# Patient Record
Sex: Male | Born: 1935 | Race: White | Hispanic: No | Marital: Married | Smoking: Former smoker
Health system: Southern US, Community
[De-identification: ages and names within clinical notes are randomized; demographics above are authoritative.]

## PROBLEM LIST (undated history)

## (undated) DIAGNOSIS — I639 Cerebral infarction, unspecified: Secondary | ICD-10-CM

## (undated) DIAGNOSIS — D649 Anemia, unspecified: Secondary | ICD-10-CM

## (undated) DIAGNOSIS — E785 Hyperlipidemia, unspecified: Secondary | ICD-10-CM

## (undated) DIAGNOSIS — B54 Unspecified malaria: Secondary | ICD-10-CM

## (undated) DIAGNOSIS — C689 Malignant neoplasm of urinary organ, unspecified: Secondary | ICD-10-CM

## (undated) DIAGNOSIS — I10 Essential (primary) hypertension: Secondary | ICD-10-CM

## (undated) DIAGNOSIS — M1990 Unspecified osteoarthritis, unspecified site: Secondary | ICD-10-CM

## (undated) HISTORY — PX: CATARACT EXTRACTION: SUR2

## (undated) HISTORY — PX: EYE SURGERY: SHX253

## (undated) HISTORY — PX: HERNIA REPAIR: SHX51

## (undated) HISTORY — PX: APPENDECTOMY: SHX54

---

## 2000-05-08 ENCOUNTER — Encounter: Payer: Self-pay | Admitting: Internal Medicine

## 2000-05-08 ENCOUNTER — Ambulatory Visit (HOSPITAL_COMMUNITY): Admission: RE | Admit: 2000-05-08 | Discharge: 2000-05-08 | Payer: Self-pay | Admitting: Internal Medicine

## 2001-07-21 ENCOUNTER — Other Ambulatory Visit: Admission: RE | Admit: 2001-07-21 | Discharge: 2001-07-21 | Payer: Self-pay | Admitting: Dermatology

## 2001-08-24 ENCOUNTER — Encounter: Payer: Self-pay | Admitting: *Deleted

## 2001-08-24 ENCOUNTER — Emergency Department (HOSPITAL_COMMUNITY): Admission: EM | Admit: 2001-08-24 | Discharge: 2001-08-24 | Payer: Self-pay | Admitting: *Deleted

## 2001-08-25 ENCOUNTER — Encounter: Payer: Self-pay | Admitting: *Deleted

## 2001-08-25 ENCOUNTER — Ambulatory Visit (HOSPITAL_COMMUNITY): Admission: RE | Admit: 2001-08-25 | Discharge: 2001-08-25 | Payer: Self-pay | Admitting: *Deleted

## 2004-12-10 ENCOUNTER — Emergency Department (HOSPITAL_COMMUNITY): Admission: EM | Admit: 2004-12-10 | Discharge: 2004-12-10 | Payer: Self-pay | Admitting: *Deleted

## 2004-12-11 ENCOUNTER — Ambulatory Visit: Payer: Self-pay | Admitting: Internal Medicine

## 2004-12-11 ENCOUNTER — Inpatient Hospital Stay (HOSPITAL_COMMUNITY): Admission: EM | Admit: 2004-12-11 | Discharge: 2004-12-19 | Payer: Self-pay | Admitting: *Deleted

## 2004-12-18 ENCOUNTER — Ambulatory Visit: Payer: Self-pay | Admitting: Internal Medicine

## 2006-03-02 ENCOUNTER — Emergency Department (HOSPITAL_COMMUNITY): Admission: EM | Admit: 2006-03-02 | Discharge: 2006-03-02 | Payer: Self-pay | Admitting: Emergency Medicine

## 2008-01-29 ENCOUNTER — Ambulatory Visit (HOSPITAL_COMMUNITY): Admission: RE | Admit: 2008-01-29 | Discharge: 2008-01-29 | Payer: Self-pay | Admitting: Nephrology

## 2008-05-13 ENCOUNTER — Inpatient Hospital Stay (HOSPITAL_COMMUNITY): Admission: EM | Admit: 2008-05-13 | Discharge: 2008-05-16 | Payer: Self-pay | Admitting: Emergency Medicine

## 2008-05-13 ENCOUNTER — Ambulatory Visit: Payer: Self-pay | Admitting: Family Medicine

## 2008-05-31 ENCOUNTER — Ambulatory Visit (HOSPITAL_COMMUNITY): Admission: RE | Admit: 2008-05-31 | Discharge: 2008-05-31 | Payer: Self-pay | Admitting: Ophthalmology

## 2010-06-22 ENCOUNTER — Emergency Department (HOSPITAL_COMMUNITY): Admission: EM | Admit: 2010-06-22 | Discharge: 2010-06-22 | Payer: Self-pay | Admitting: Emergency Medicine

## 2011-04-10 NOTE — Discharge Summary (Signed)
NAME:  Reginald Waller, Reginald Waller NO.:  [id number]   MEDICAL RECORD NO.:  [id number]          PATIENT TYPE:  INP   LOCATION:  3741                         FACILITY:  [facility]   PHYSICIAN:  Leighton Roach McDiarmid, M.D.DATE OF BIRTH:  [birth year]   DATE OF ADMISSION:  05/13/2008  DATE OF DISCHARGE:  05/16/2008                               DISCHARGE SUMMARY   PRIMARY CARE PHYSICIAN:  Kirk Ruths, MD   CARDIOLOGIST:  Ritta Slot, MD   GASTROENTEROLOGIST:  Lionel December, MD   DISCHARGE DIAGNOSES:  1. Anemia secondary to iron deficiency and believe gastrointestinal      loss.  2. Shortness of breath secondary to anemia.  3. Hypertension.  4. Peripheral vascular disease.  5. Congestive heart failure with mild diastolic dysfunction.  6. Questionable chronic renal insufficiency.   DISCHARGE MEDICATIONS:  1. Hydrochlorothiazide 25 mg one p.o. daily.  2. Zocor 40 mg one p.o. daily.  3. Lotrel 10/40 mg one p.o. daily.  4. Ferrous sulfate 325 mg one p.o. daily.  5. Please restart aspirin in 2 weeks.  6. Zantac p.r.n.  7. Percocet p.r.n.  8. Skelaxin daily.   MEDICATIONS DISCONTINUED DURING THIS HOSPITALIZATION:  1. Aspirin  2. Plavix  3. Pletal to be restarted per PCP and cardiologist.   INITIAL LABS:  White blood cell 8.1, hemoglobin 6.4, hematocrit 18.4,  previous hemoglobin in 2006 was 12 and then hemoglobin in May 2009 was  9, and platelets 500,000.  Cardiac enzymes negative x3.   D-dimer elevated at 1.17.  Fecal occult blood positive.  Sodium 141,  potassium 4.5, chloride 110, glucose 96, BUN 37, and creatinine 1.9.   DISCHARGE LABS:  Anemia panel showing 1.6% reticulocytes, iron 12, TIBC  395, and percent saturation 3.  Vitamin B12 low at 324, low normal at  324 folate greater than 20, and ferritin 9.  Hemoglobin 10.2, hematocrit  30.6, white blood cells 8.5, and platelets 414,000.  Sodium 138,  potassium 5.1 but hemolyzed, chloride 108, bicarb 24, glucose 92,  BUN  28, creatinine 1.59, and calcium 8.9.   PROCEDURES AND IMAGING:  1. Portable chest x-ray showing emphysema, but no acute      cardiopulmonary process.  2. Status post transfusion of 3 units packed red blood cells during      this hospitalization.  3. Esophagogastroduodenoscopy during this hospitalization showing      normal anatomy and nothing to explain anemia or heme-positive      stool.   HOSPITAL COURSE:  For full summary, please see dictated H&P.  In short,  this is an 77 year old gentleman with history of hypertension,  hyperlipidemia, and GERD who presents with shortness of breath x3 weeks  and he was found to have a hemoglobin of 6.4.  1. Anemia.  This was thought to be a subacute-to-chronic process given      previous hemoglobin in 2006 was normal at 12 and then repeat one in      May of this year was low at 9 and on admission was found to be 6.4.      Hemoccult was positive so  it was believed that the patient's blood      loss stemmed from the GI tract.  Gastroenterology was consulted.      Of note, the patient had a normal colonoscopy 3 months ago in [place]      per Dr. Karilyn Cota.  Gastroenterology here decided to perform an      endoscopy which revealed normal anatomy.  The patient was      transfused 3 units of packed red blood cells and hemoglobin was      stable prior to discharge at 10.  GI recommends having the patient      follow up for possible CT enteropathy or consideration of capsule      endoscopy.  The patient states he will follow up with Dr. Karilyn Cota up      in [place].  Iron was started prior to discharge.  2. Dyspnea likely secondary to problem number one of anemia.      Initially, D-dimer was elevated; however, Wells' criteria was low      probability.  It was decided to monitor the patient and re-evaluate      upon transfusion.  After 3 units, the patient's dyspnea had      resolved and the patient did not present did not have      lightheadedness upon  standing.  3. Hypertension.  The patient's blood pressures were treated with      hydrochlorothiazide and Lotrel as per home regimen and the patient      tolerated this well.  4. Congestive heart failure.  Per report, the patient had diastolic      dysfunction.  The patient's cardiologist is Dr. Lynnea Ferrier at      [facility].  Per records, an echo in April of      this year showed mild diastolic dysfunction with normal left      ventricular systolic function.  5. Peripheral vascular disease.  Per records, the patient had multiple      falls stenosis of multiple blood vessels.  The patient was on      Pletal, Plavix, and aspirin for that this.  All three were held      prior to endoscopy, and upon discharge the patient was only to      restart aspirin 2 weeks after endoscopy.  The rest of antiplatelet      agents to be restarted at discretion of PCP and cardiologist.  6. Hyperlipidemia.  Statin was continued.   FOLLOW UP:  The patient will follow up Dr. Karilyn Cota of gastroenterology in  [place] and to call for appointment in the next 1-2 weeks.      Eustaquio Boyden, MD  Electronically Signed      Leighton Roach McDiarmid, M.D.  Electronically Signed    JG/MEDQ  D:  05/17/2008  T:  05/18/2008  Job:  [id number]   cc:   Kirk Ruths, M.D.  Ritta Slot, MD  Lionel December, M.D.

## 2011-04-10 NOTE — Consult Note (Signed)
NAME:  Reginald Waller, Reginald Waller NO.:  [id number]   MEDICAL RECORD NO.:  [id number]          PATIENT TYPE:  INP   LOCATION:  3741                         FACILITY:  [facility]   PHYSICIAN:  Bernette Redbird, M.D.   DATE OF BIRTH:  05/03/1919   DATE OF CONSULTATION:  05/13/2008  DATE OF DISCHARGE:                                 CONSULTATION   The family practice teaching service (Dr. Tawanna Cooler McDiarmid, attending)  asked [place] to see this 77 year old gentleman because of severe anemia and  heme-positive stool.   Reginald Waller has no prior significant GI history, specifically no history  of GI bleeding or ulcers.  He reportedly had a negative colonoscopy by  Dr. Karilyn Cota in [place] several months ago.   He was admitted through the emergency room today because of significant  exertional dyspnea and was found to have a low hemoglobin level of 7.1.  He was therefore admitted.  He was noted to have Hemoccult-positive  stool, but there is no mention of melena.  No history of rectal  bleeding.   Of note, the patient takes a daily 81 mg aspirin along with Plavix and  Pletal, and he has not been on PPI therapy.   Of note, the anemia is normocytic with an MCV of 87 and an RDW that is  normal at 14.8.  His hemoglobin several years ago was approximately  12.7.  Reportedly, he had a hemoglobin of 9.5 last month, although I do  not see a copy of that on e-chart.   PAST MEDICAL HISTORY:  No known medication allergies.   OUTPATIENT MEDICATIONS:  Vicodin, Skelaxin, hydrochlorothiazide, Plavix,  simvastatin, Cozaar and 81 mg aspirin.   OPERATIONS:  Appendectomy.   CHRONIC MEDICAL ILLNESSES:  Hypertension, peripheral vascular disease  with apparently claudication, no known coronary artery disease or  apparently any COPD.   HABITS:  The patient smoked for about 60 years.  He used to drink  socially.   FAMILY HISTORY:  Liver cancer in a sibling, negative for colon cancer or  ulcers.   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   REVIEW OF SYSTEMS:  Basically, negative other than the exertional  dyspnea noted.  Specifically, GI review of systems is pertinent only for  constipation, which seems to be related to medications.  He has to use a  laxative (milk of magnesia) to maintain defecation.  No problem with  anorexia, involuntary weight loss, dysphagia, reflux symptoms, abdominal  pain of any significance, diarrhea, or rectal bleeding.  He does note  some gas since his appendectomy several years ago.   PHYSICAL EXAMINATION:  GENERAL:  A pale, but pleasant, fairly articulate  gentleman, in no acute distress.  HEENT:  He is anicteric.  CHEST:  Clear to auscultation.  HEART:  Soft apical systolic murmur.  ABDOMEN:  Without organomegaly, guarding, mass, or tenderness.  Stool is  heme-positive when checked earlier today, and was not repeated.  NEUROLOGICAL:  The patient is  coherent and appropriate without obvious  cranial nerve or upper extremity motor deficits.   LABORATORY DATA:  Hemoglobin 7.1, MCV 79, RDW normal at 14.8, and  platelet count 500,000.  D-dimer elevated at 1.17, BUN 37 with  creatinine of 1.9 (baseline values not currently known).  Occult blood  positive.   IMPRESSION:  1. Severe progressive anemia, normocytic.  2. Heme-positive stool without overt gastrointestinal bleeding.  3. History of aspirin exposure prior to admission, without proton pump      inhibitor coverage.  4. Constipation, apparently medication related.   DISCUSSION:  With a reportedly negative colonoscopy within the past  several months, and with a history of aspirin exposure, I would  certainly wonder about aspirin-induced gastropathy as a source of the  subacute or low-grade GI bleed causing the anemia.  Gastric cancer seems  unlikely given the absence of upper tract  symptoms.   RECOMMENDATIONS:  1. Endoscopic evaluation in the morning, the nature, purpose, and      risks of which have been reviewed with the patient.  If that is      negative, we may need to verify records from the recent colonoscopy      and possibly consider small bowel evaluation.  2. I would consider MiraLax for outpatient management of the patient's      constipation.   We appreciate the opportunity to have seen this patient in consultation  with you.           ______________________________  Bernette Redbird, M.D.     RB/MEDQ  D:  05/13/2008  T:  05/14/2008  Job:  [id number]   cc:   Kirk Ruths, M.D.

## 2011-04-10 NOTE — Op Note (Signed)
NAME:  Reginald Waller, Reginald Waller NO.:  [id number]   MEDICAL RECORD NO.:  [id number]          PATIENT TYPE:  INP   LOCATION:  3741                         FACILITY:  [facility]   PHYSICIAN:  Graylin Shiver, M.D.   DATE OF BIRTH:  03/11/1919   DATE OF PROCEDURE:  05/14/2008  DATE OF DISCHARGE:                               OPERATIVE REPORT   INDICATION FOR PROCEDURE:  Anemia.   The patient reports a normal colonoscopy several months ago by Dr.  Karilyn Cota.  We do not have that report.   The anemia appears to be iron deficient, percent saturation is 12, stool  for occult blood was positive, and hemoglobin and hematocrit on  admission 6.4 and 18.4 respectively.   The patient was admitted because of the exertional dyspnea.  There was  no gross GI bleeding.   Informed consent was obtained after explanation of the risks of  bleeding, infection, and perforation.   PREMEDICATIONS:  1. Fentanyl 50 mcg IV.  2. Versed 5 mg IV.   PROCEDURE:  With the patient in the left lateral decubitus position, the  Pentax gastroscope was inserted into the oropharynx and passed into the  esophagus.  It was advanced down the esophagus, then into the stomach,  and into the duodenum.  The second portion and bulb of the duodenum were  normal.  The stomach looked normal in its entirety.  The esophagus  looked normal in its entirety.  He tolerated the procedure well without  complications.   IMPRESSION:  Normal esophagogastroduodenoscopy.   There is nothing on this examination to explain anemia or heme-positive  stool.   It would be helpful to get the colonoscopy report from Dr. Karilyn Cota for  our documentation purposes.  I think once the H&H are stable, he could  probably followup as an outpatient with either [place] or with Dr. Karilyn Cota in  [place], whoever the patient chooses to followup with.  We would probably  next consider evaluation of small bowel, either with CT enteropathy or  consideration of capsule  endoscopy.           ______________________________  Graylin Shiver, M.D.    SFG/MEDQ  D:  05/14/2008  T:  05/15/2008  Job:  [id number]   cc:   Leighton Roach McDiarmid, M.D.  Bernette Redbird, M.D.  Lionel December, M.D.

## 2011-04-10 NOTE — H&P (Signed)
NAMESTEPHANOS, FAN NO.:  [id number]   MEDICAL RECORD NO.:  [id number]          PATIENT TYPE:  INP   LOCATION:  3741                         FACILITY:  [facility]   PHYSICIAN:  Leighton Roach McDiarmid, M.D.DATE OF BIRTH:  13-Jul-1919   DATE OF ADMISSION:  05/13/2008  DATE OF DISCHARGE:                              HISTORY & PHYSICAL   CHIEF COMPLAINT:  Shortness of breath.   PRIMARY CARE PHYSICIAN:  Kirk Ruths, M.D.   CARDIOLOGIST:  Ritta Slot, MD   OPHTHALMOLOGISTAlford Highland. Rankin, MD   HISTORY OF PRESENT ILLNESS:  This is an 77 year old gentleman with  history of hypertension and GERD who presents with a 3-week history of  dyspnea on exertion, which the patient reports has progressively  worsened over the last week.  He state decreased ability to do yardwork  and housework.  He has to sit down and catch his breath and at times,  takes up to 10 minutes to catch his breath.  The patient reports  occasional chest pain and left arm pain with dyspnea characterized as  dull-type pain like he is going to pass out.  No loss of consciousness  though.  The patient has been dizzy and lightheaded for a week when he  stands up or goes to move around.  The patient denies cough or fever.  The patient was today at the ophthalmologist to undergo a procedure,  noted to be short of breath and sent to the ER for evaluation.  In the  ER, the patient was found to have a hemoglobin of 6.4.  The patient  states that 1 month ago he was told he had some significant peripheral  vascular disease in his neck, legs, and inguinal region.  Also, the  patient reports that he had a normal colonoscopy 3 months ago at  [facility] in [place], [place].  The patient denies melanotic  stool or diarrhea or any frank blood in his stool or urine.  The patient  denies vomiting.   ALLERGIES:  SHELLFISH, PLAIN ASPIRIN, although coated aspirins are okay.   PAST MEDICAL HISTORY:  The  patient reports he has had high blood  pressure in the past, as well as hyperlipidemia, arthritis, glaucoma,  and suffers from constipation.  Questionable chronic renal  insufficiency.  Retinal occlusion in the past with bilateral cataract  surgery.  Gastroesophageal reflux disease and chronic back pain as well  as severe peripheral vascular disease.   PAST SURGICAL HISTORY:  Perforated appendicitis, status post  appendectomy in 2006, also bilateral inguinal hernias remotely and  bilateral cataract surgery.   MEDICATIONS:  1. Coated aspirin 81 mg daily.  2. Tylenol p.r.n.  3. Hydrocodone/APAP p.r.n.  4. Skelaxin daily.  5. Hydrochlorothiazide 25 mg 1 p.o. daily.  6. Plavix 75 mg 1 p.o. daily.  7. Simvastatin 40 mg 1 p.o. daily.  8. Lotrel 10/40 mg 1 p.o. daily.  9. Pletal.   SOCIAL HISTORY:  The patient lives with his wife, has no children.  They  have been married for 62 years.  The patient is a retired  tobacco  machinist.  He states he has greater than 6-pack-year history but quite  5 years ago.  Denies alcohol and recreational drugs.   FAMILY HISTORY:  Significant for lung cancer, liver cancer, breast  cancer, and leukemia and brother who passed away from an MI at 70 years  old.   REVIEW OF SYSTEMS:  Positive for nocturia and constipation and recent  deterioration of vision.  The patient states that he thought he had  passed some blood in his stool 3 months ago, but he took stool for  evaluation to his PCP and it was found to be negative for blood.  Rest  of review of systems as per HPI, otherwise negative.   PHYSICAL EXAM:  VITAL SIGNS:  Temperature 98.1, pulse 70, although when  he sits up or stands he is tachycardiac to 100, respiratory rate is 14,  although when he stands up, it increases to 30.  Blood pressure 142/49.  Pulse ox 100% on room air.  GENERAL:  No acute distress, lying supine.  HEENT:  Normocephalic, atraumatic.  Pupils equally round and reactive to   light.  Extraocular movements intact.  Moist mucous membrane.  No  pharyngeal erythema or edema.  NECK:  No lymphadenopathy.  Positive bilateral bruits.  CARDIOVASCULAR:  Normal S1 and S2, 2/6 early systolic ejection murmur  best heard at the left upper sternal border.  LUNGS:  Crackles throughout, good air movement.  ABDOMEN:  Soft, nontender, nondistended, no hepatosplenomegaly, no mass,  and increased bowel sounds.  EXTREMITIES:  No clubbing, cyanosis, or edema.  NEUROLOGIC:  Cranial nerves II through XII grossly intact.  Motor  strength grossly intact.  Gait somewhat wobbly but the patient denies  lightheadedness when he stands up and walks.  MUSCULOSKELETAL:  Full range of motion.   LABS:  Fecal occult blood positive.  D-dimer 1.17.  Elevated iStat with  sodium 141, potassium 4.5, chloride 110, bicarb 21, BUN 37, creatinine  1.9, and glucose 96.  Point of care enzymes negative.  Troponin less  than 0.05.  White blood cell 8.1, hemoglobin 6.4, MCV 89, hematocrit  18.4, and platelets 500.  Chest x-ray showing emphysema with chronic interstitial coarsening, no  acute cardiopulmonary process.   ASSESSMENT AND PLAN:  This is an 77 year old male with a history of  hypertension, hyperlipidemia, bilateral cataract surgery, and  gastroesophageal reflux disease who presents with a 3-week history of  shortness of breath and hemoglobin of 6.4.  1. Shortness of breath likely secondary to anemia.  We will start IV      fluids and transfuse, monitor for improvement.  He has increased D-      dimer, although no hypoxia, hypotension, or tachycardia at      baseline, so less likely PE.  Also, symptoms have been gradual and      progressively worsening over the past several weeks.  We will      continue to monitor.  If no improvement after transfusion, low      threshold for helical CT to evaluate for PE.  2. Anemia.  In 2006, the patient had hemoglobin of 12.  In May 2009,      the patient had  hemoglobin of 9.  Now hemoglobin is 6.4 likely      chronic or subacute bleed, not acute blood loss.  Monitor with CBC      q. 6 hours.  Transfused 3 units, first one in the ED.  We will  monitor hemoglobin for improvement.  Per report normal colonoscopy      in February 2009, we will obtain records.  We will consult      gastroenterology for evaluation and possible endoscopy not acutely.      NPO for now while gastroenterology comes by.  This is a normocytic      anemia.  We will obtain an anemia panel to further evaluate.  Hold      Plavix, aspirin, and Pletal and no NSAIDs for now.  Start with high      dose PPI.  3. Chest pain.  The patient reports some chest pain with exertion when      he gets short of breath although currently none.  Regardless, we      will cycle cardiac enzymes and repeat EKG in the morning to rule      out ACS.  4. Hypertension.  Continue hydrochlorothiazide and Lotrel, withhold      parameters of holding for systolics less than 120.  5. Eye disease.  Monitored, currently stable.  6. Peripheral vascular disease.  Per records, the patient with carotid      Dopplers with 50-69% stenosis bilaterally.  Also, with right      profunda femoral artery 70-99% stenosis, right superficial femoral      artery 70-99% stenosis, right popliteal artery 70-99% stenosis,      left external iliac artery greater than 60% stenosis, left      superficial femoral artery occluded as well as left APA occluded,      bilateral ABIs with moderate arterial occlusion at rest.  We will      hold Pletal, Plavix, and aspirin for now, otherwise stable.  7. Congestive heart failure.  Echo 2D from April 2009 with mild      diastolic dysfunction but normal left ventricular systolic      function.  8. Fluids, electrolytes, nutrition.  Hold NPO until evaluation by GI.      Maintenance of IV fluids with D5 half-normal.  9. Prophylaxis, high dose PPI and SEDs.  10.Disposition, pending upper GI  and GI consult.      Eustaquio Boyden, MD  Electronically Signed      Leighton Roach McDiarmid, M.D.  Electronically Signed    JG/MEDQ  D:  05/13/2008  T:  05/14/2008  Job:  [id number]

## 2011-04-10 NOTE — Op Note (Signed)
NAMEJARROD, Reginald Waller               ACCOUNT NO.:  [id number]   MEDICAL RECORD NO.:  [id number]          PATIENT TYPE:  AMB   LOCATION:  SDS                          FACILITY:  [facility]   PHYSICIAN:  Alford Highland. Rankin, M.D.   DATE OF BIRTH:  05-15-1919   DATE OF PROCEDURE:  05/31/2008  DATE OF DISCHARGE:  05/31/2008                               OPERATIVE REPORT   PREOPERATIVE DIAGNOSES:  1. Dense vitreous hemorrhage left eye.  2. 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  3. 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   POSTOPERATIVE DIAGNOSES:  1. Dense vitreous hemorrhage left eye.  2. 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  3. 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   PROCEDURE:  Posterior vitrectomy - endolaser panphotocoagulation left  eye - 25 gauge.   SURGEON:  Fawn Kirk, MD   ANESTHESIA:  Local retrobulbar anesthesia control.   FINDINGS:  Mild optic atrophy underlying.   INDICATIONS FOR PROCEDURE:  The patient is an 77 year old man who has  profound vision loss on the basis of dense vitreous hemorrhage of left  eye non-clearing.  The patient understands this is an attempt to remove  the vitreous opacification to regain his best visual function.  He  understands the risks of anesthesia including the recurrence of death.  He also understands the risk of the eye from the underlying condition,  as well as surgical repair including but not limited to hemorrhage,  infection, scarring, need for surgery, no change in vision, loss of  vision, and progression of disease despite intervention.  Appropriate  signed consent was obtained.  The patient was taken to the operating  room.   In the operating room, appropriate monitors followed by mild sedation.  Xylocaine 2% 5 mL injected 5 mL retrobulbar with additional 5 mL  laterally in the fascia modified Darel Hong.  The left periocular region  was sterilely prepped and draped in the  usual sterile fashion.  Lid  speculum applied.  A 25-gauge trocar placed inferotemporally.  The  infusion turned on.  A superior trocar was applied.  Core vitrectomy was  then begun.  Vitreous was cleared.   It appeared to be a small branch artery occlusion inferotemporally.  This area was treated and also some mild scattered laser was placed in  mid periphery.  No retinal holes or tears were identified.  Fluid  exchange was then completed to hasten the clearance of the vitreous  hemorrhage.  Thereafter an air-fluid exchange was completed and  the fluid was allowed to remain in the eye for postoperative visual  clarity.  At this time, the infusion removed.  Subconjunctival Decadron  applied.  Sterile patch Fox shield applied. .  The patient tolerated the  procedure without complication.  The patient was taken to the PACU area  to be followed and discharged later as an outpatient.      Alford Highland Rankin, M.D.  Electronically Signed     GAR/MEDQ  D:  05/31/2008  T:  06/01/2008  Job:  [id number]

## 2011-04-13 NOTE — Discharge Summary (Signed)
NAME:  Reginald Waller, Reginald Waller NO.:  [id number]   MEDICAL RECORD NO.:  [id number]          PATIENT TYPE:  INP   LOCATION:  A338                          FACILITY:  [facility]   PHYSICIAN:  Dalia Heading, M.D.  DATE OF BIRTH:  [birth year]   DATE OF ADMISSION:  12/11/2004  DATE OF DISCHARGE:  01/24/2006LH                                 DISCHARGE SUMMARY   HOSPITAL COURSE:  Patient is an 77 year old white male who presented to  [facility] with nonspecific abdominal pain.  An ultrasound of the  gallbladder revealed cholelithiasis.  A CT scan of the abdomen and pelvis  was performed which was originally read as cholecystitis and cholelithiasis,  but then was reread as perforated appendicitis.  Patient went to the  operating room on December 11, 2004 and underwent a laparoscopic  appendectomy.  He was noted to have a perforated appendix.  His abdominal  cavity was irrigated copiously.  A drain was placed in the right lower  quadrant.  Patient's postoperative course was remarkable for a postoperative  ileus which subsequently resolved after nasogastric tube decompression.  His  diet was advanced without difficulty once this was done.   The patient is being discharged home on December 19, 2004 in good improving  condition.   DISCHARGE INSTRUCTIONS:  The patient is to follow up with Dr. Franky Macho  on December 21, 2004.   DISCHARGE MEDICATIONS:  He is to resume all the medications as previously  prescribed.   PRINCIPAL DIAGNOSIS:  1.  Perforated appendicitis with peritonitis.  2.  Cholelithiasis.   PRINCIPAL PROCEDURE:  Laparoscopic appendectomy on December 11, 2004.      MAJ/MEDQ  D:  12/19/2004  T:  12/19/2004  Job:  [id number]   cc:   Madelin Rear. Sherwood Gambler, MD  [address]  [place]  [place] [address]  Fax: [phone]

## 2011-04-13 NOTE — H&P (Signed)
Reginald Waller, GLADU NO.:  [id number]   MEDICAL RECORD NO.:  [id number]          PATIENT TYPE:  INP   LOCATION:  A226                          FACILITY:  [facility]   PHYSICIAN:  Dalia Heading, M.D.  DATE OF BIRTH:  19-Jun-1919   DATE OF ADMISSION:  12/11/2004  DATE OF DISCHARGE:  LH                                HISTORY & PHYSICAL   AGE:  77 years old.   REASON FOR ADMISSION:  1.  Cholecystitis.  2.  Cholelithiasis.   HISTORY OF PRESENT ILLNESS:  The patient is an 77 year old white male who  presents with cholecystitis secondary to cholelithiasis.  He had presented  to the emergency room yesterday with upper abdominal pain.  An ultrasound of  the gallbladder was performed this morning which revealed cholecystitis with  cholelithiasis.  CT scan was also done due to lower abdominal pain.  This  revealed dilated pancreatic and common bile ducts.  Some straining of the  small bowel mesentery was noted.   The patient states this is his first episode of biliary colic.   PAST MEDICAL HISTORY:  The past medical history is for the most part  unremarkable.   PAST SURGICAL HISTORY:  Bilateral inguinal herniorrhaphies.   CURRENT MEDICATIONS:  Baby aspirin.   ALLERGIES:  No known drug allergies.   REVIEW OF SYSTEMS:  Noncontributory.   PHYSICAL EXAMINATION:  GENERAL APPEARANCE:  The patient is a well-developed,  well-nourished, white male in no acute distress.  VITAL SIGNS:  He is afebrile and vital signs are stable.  HEENT:  No scleral icterus.  LUNGS:  Clear to auscultation with equal breath sounds bilaterally.  HEART:  Regular rate and rhythm without S3, S4 or murmurs.  ABDOMEN:  Soft with tenderness noted primarily in the upper quadrant, though  there is mild tenderness in the lower quadrants of the lower abdomen.  No  rigidity is noted.  No hepatosplenomegaly or masses are noted.  RECTAL:  Examination was deferred at this time.   LABORATORY DATA:  White  blood cell count 11.7, hematocrit 37, platelet count  325.  MET-7 is remarkable only for a sodium of 130, total bilirubin 2.1 and  amylase lipase were within normal limits.  Liver enzyme tests were within  normal limits.   IMPRESSION:  1.  Cholecystitis.  2.  Cholelithiasis.   PLAN:  The patient will be admitted to the hospital for further evaluation  and treatment.  A gastroenterology consultation will be obtained due to the  patient's elevated total bilirubin and slightly dilated common bile and  pancreatic ducts.  He ultimately will need a laparoscopic cholecystectomy.  The risks and benefits of the procedure, including bleeding, infection,  hepatobiliary injury and the possibility of an open procedure, were full  explained to the patient, who gave informed consent.     Mark   MAJ/MEDQ  D:  12/11/2004  T:  12/11/2004  Job:  [id number]   cc:   Madelin Rear. Sherwood Gambler, MD  [address]  [place]  [place] [address]  Fax: [phone]

## 2011-04-13 NOTE — Consult Note (Signed)
Reginald Waller, Reginald Waller               ACCOUNT NO.:  [id number]   MEDICAL RECORD NO.:  [id number]          PATIENT TYPE:  INP   LOCATION:  A338                          FACILITY:  [facility]   PHYSICIAN:  Lionel December, M.D.    DATE OF BIRTH:  10/04/1919   DATE OF CONSULTATION:  12/11/2004  DATE OF DISCHARGE:                                   CONSULTATION   REASON FOR CONSULTATION:  Dilated pancreatic duct and common bile duct.   HISTORY OF PRESENT ILLNESS:  Reginald Waller is an 77 year old Caucasian male  with a three-day history of acute onset of shooting, sharp, abdominal pain  to bilateral lower quadrants.  He also notes that he had nausea and  vomiting.  He notes chills and low-grade fever at home.  Previous ultrasound  revealed cholelithiasis and a mildly dilated pancreatic and common bile  duct.  This was followed by CT scan which revealed mild edema to the  mesentery, mild small-bowel thickening in the pelvis, and small amount of  pelvic ascites, small amount of peri-hepatic fluid, mild pancreatic ductal  dilatation of  around 5 mm, and slightly prominent common bile duct of  around 6.7 mm.  The patient was found to have elevated total bilirubin at  2.1, mostly indirect at 1.8.  White blood cell count is 11,700 today.   The patient notes pain awakening him from sleep on several occasions.  The  pain is not necessarily associated with his diet or food intake.  He denies  any changes in bowel habits including diarrhea or constipation.  He denies  any upper abdominal pain, heartburn, indigestion, dysphagia, or odynophagia.  He denies any rash or jaundice.   PAST MEDICAL HISTORY:  1.  Bilateral cataract surgery.  2.  Glaucoma.  3.  Bilateral inguinal hernia repairs.   MEDICATIONS PRIOR TO ADMISSION:  1.  Aspirin 81 mg daily.  2.  Tylenol p.r.n.  3.  Zantac 150 mg p.r.n.   MEDICATIONS SINCE ADMISSION:  1.  Dilaudid 1 mg q.3h. p.r.n. pain.  2.  Tylenol p.r.n.  3.  Zosyn 3.375 g  q.6h.  4.  Protonix 40 mg daily.  5.  Zofran 40 mg IV q.6h.   ALLERGIES:  No known drug allergies.  He does report being allergic to  [facility].   FAMILY HISTORY:  Positive for hepatic cellular carcinoma in one sister.  Father deceased in his 33s due to CVA.  Mother deceased in her 85s secondary  to unknown carcinoma.  He has multiple siblings, 9 deceased siblings, with  multiple medical problems which are otherwise noncontributory.   SOCIAL HISTORY:  Mr. Fretwell has been married for 58 years.  He has been  retired for 21 years.  He reports tobacco, using a pack about every three  days. He denies any alcohol or drug use.   REVIEW OF SYSTEMS:  CONSTITUTIONAL:  Denies any fatigue.  Denies any  insomnia.  CARDIOVASCULAR:  Does not describe palpitations, murmur, gallop,  or rubs, dyspnea, cough, hemoptysis. GI:  See HPI.  He has never had  screening colonoscopy.  GU:  Denies  any dysuria, hematuria, frequency.  Denies any dark urine.   PHYSICAL EXAMINATION:  VITAL SIGNS:  Temperature 98.6, pulse 80,  respirations 20, blood pressure 137/60, weight 146 pounds.  GENERAL: As stated, Reginald Waller is an 77 year old, well-developed, Caucasian  male who is alert and oriented, in no acute distress.  SKIN:  Warm and dry, intact, without any rash or jaundice.  HEENT:  Normocephalic and atraumatic.  Conjunctivae pink.  Oropharynx pink  and moist without any lesion.  NECK:  Supple without thyromegaly.  HEART:  Regular rate and rhythm, with normal S1 and S2, without any murmurs,  gallops, or rubs.  LUNGS:  Clear to auscultation bilaterally.  ABDOMEN:  Positive bowel sounds x 4.  He does have severe tenderness to  right lower quadrant on palpation.  Positive McBurney's point tenderness.  He has bilateral lower quadrant tenderness on superficial palpation.  He  does have rebound tenderness and guarding as well.  EXTREMITIES:  Without edema bilaterally.  RECTAL:  Performed by Dr. Karilyn Cota.  A small amount of  light brown stool was  obtained from the vault which was hemoccult negative.   LABORATORY DATA AND OTHER STUDIES:  WBC 11.7, hemoglobin 12.7, hematocrit  36.8, platelets 325.  Calcium 8.7.  Sodium 130, potassium 3.6, chloride 98,  CO2 25, BUN 16, creatinine 1.4, glucose 122.  Total bilirubin 2.1, direct  0.4, indirect 1.6, alkaline phosphatase 76, SGOT 23, SGPT 13, total protein  6.3, albumin 3.7, amylase 29, lipase 23.   IMPRESSION:  Reginald Waller is an 77 year old Caucasian male who clinically  appears to have acute appendicitis.  Incidentally, he is also found to have  cholelithiasis and mild dilatation of common bile duct and pancreatic duct.  CT was reviewed with Dr. Karilyn Cota and radiologist who concurs that this  represents acute appendicitis.  As far as the hyperbilirubinemia is  concerned, this is mostly indirect and possibly related to Gilbert's  syndrome.  This will be discussed between Dr. Karilyn Cota and Dr. Lovell Sheehan.   RECOMMENDATIONS:  1.  Dr. Karilyn Cota to contact Dr. Lovell Sheehan.  2.  Patient to be n.p.o. for laparoscopic appendectomy.  3.  Agree with antibiotics.  4.  Will follow as necessary.   I would like to thank Dr. Lovell Sheehan for allowing [place] to participate in the care  of Reginald Waller  D:  12/11/2004  T:  12/11/2004  Job:  [id number]   cc:   Dalia Heading, M.D.  [address] [place]  [place] [address]  Fax: [phone]

## 2011-04-13 NOTE — Op Note (Signed)
NAMEJAIVEN, Reginald Waller NO.:  [id number]   MEDICAL RECORD NO.:  [id number]          PATIENT TYPE:  INP   LOCATION:  A338                          FACILITY:  [facility]   PHYSICIAN:  Dalia Heading, M.D.  DATE OF BIRTH:  1919-03-24   DATE OF PROCEDURE:  12/11/2004  DATE OF DISCHARGE:                                 OPERATIVE REPORT   PREOPERATIVE DIAGNOSIS:  Acute appendicitis, possible cholecystitis.   POSTOPERATIVE DIAGNOSIS:  Perforated appendicitis.   PROCEDURE:  Laparoscopic appendectomy.   SURGEON:  Dr. Franky Macho.   ANESTHESIA:  General endotracheal.   INDICATIONS:  The patient is an 77 year old white male who presents with  worsening lower abdominal pain.  Initial thought was that he had  cholecystitis secondary to cholelithiasis.  A re-reading of the CT scan of  the abdomen and pelvis revealed possible acute appendicitis. The patient now  comes to the operating room for a laparoscopic appendectomy, possible  cholecystectomy.  The risks and benefits of the procedures including  bleeding, infection, hepatobiliary injury and the possibility of an open  procedure were fully explained to the patient.  He gave informed consent.   PROCEDURE NOTE:  The patient was placed in the supine position.  After  induction of general endotracheal anesthesia, the abdomen was prepped and  draped using the usual sterile technique with Hibiclens.   An infraumbilical incision was made down to the fascia.  A Veress needle was  introduced into the abdominal cavity and confirmation of placement was done  using a saline drop test.  The abdomen was then insufflated to 16 mm of  mercury pressure.  An 11 mm trocar was introduced into the abdominal cavity  under direct visualization without difficulty.  An additional 12 mm trocar  was placed in the suprapubic region and a 5 mm trocar was placed in the left  lower quadrant region.  The patient was noted to have purulent fluid along  the dome of the right lobe of the liver as well as the pericolic gutter and  pelvis.  The gallbladder was not noted to be tense or significantly  inflamed.  The appendix was visualized and the distal half was gangrenous  and had ruptured.  The mesoappendix was divided using the harmonic scalpel.  An endo GIA was placed across the base of the appendix and fired.  The  appendix was removed using an endo catch bag.  The pelvis and right  pericolic gutters as well as over the dome of the liver were copiously  irrigated with multiple liters of normal saline.  A #10 flat Jackson-Pratt  drain was placed into the pelvis and right lower quadrant through the 5 mm  trocar site.  This was secured at the skin using a 3-0 nylon interrupted  suture.  All fluid and air were then evacuated from the abdominal cavity  prior to removing the trocars.   All wounds were irrigated with normal saline.  All wounds were injected with  0.5% Sensorcaine.  The infraumbilical fascia as well as suprapubic fascia  was reapproximated using an 0 Vicryl interrupted  suture.  All skin incisions  were closed using staples.  Dry sterile dressings were applied.   All instrument and needle counts were correct at the end of the procedure.  The patient was extubated in the operating room and went back to the  recovery room awake in stable condition.   COMPLICATIONS:  None.   SPECIMENS:  Appendix.   BLOOD LOSS:  Minimal.   DRAINS:  A Jackson-Pratt drain to pelvis.     Mark   MAJ/MEDQ  D:  12/11/2004  T:  12/11/2004  Job:  [id number]   cc:   Madelin Rear. Sherwood Gambler, MD  [address]  [place]  [place] [address]  Fax: [phone]

## 2011-08-23 LAB — CROSSMATCH
ABO/RH(D): A POS
Antibody Screen: NEGATIVE

## 2011-08-23 LAB — BASIC METABOLIC PANEL
BUN: 35 — ABNORMAL HIGH
CO2: 24
CO2: 25
Calcium: 8.9
Calcium: 9
Chloride: 107
Creatinine, Ser: 1.88 — ABNORMAL HIGH
Creatinine, Ser: 1.39
GFR calc Af Amer: 50 — ABNORMAL LOW
GFR calc Af Amer: 59 — ABNORMAL LOW
Glucose, Bld: 94
Potassium: 5.1
Potassium: 4.8
Sodium: 138
Sodium: 138

## 2011-08-23 LAB — CBC
HCT: 27.5 — ABNORMAL LOW
Hemoglobin: 6.4 — CL
Hemoglobin: 9.4 — ABNORMAL LOW
Hemoglobin: 9.7 — ABNORMAL LOW
MCHC: 33.5
MCHC: 34.1
MCHC: 34.6
MCV: 87.3
MCV: 89.6
Platelets: 500 — ABNORMAL HIGH
RBC: 3.15 — ABNORMAL LOW
RBC: 3.31 — ABNORMAL LOW
RBC: 3.96 — ABNORMAL LOW
RDW: 14.8
WBC: 7

## 2011-08-23 LAB — POCT I-STAT, CHEM 8
BUN: 37 — ABNORMAL HIGH
Creatinine, Ser: 1.9 — ABNORMAL HIGH
Glucose, Bld: 96
Hemoglobin: 7.1 — CL
Sodium: 141
TCO2: 21

## 2011-08-23 LAB — OCCULT BLOOD X 1 CARD TO LAB, STOOL: Fecal Occult Bld: POSITIVE

## 2011-08-23 LAB — CK TOTAL AND CKMB (NOT AT ARMC)
CK, MB: 3.8
Relative Index: INVALID
Total CK: 84

## 2011-08-23 LAB — HEMOGLOBIN AND HEMATOCRIT, BLOOD: HCT: 30.6 — ABNORMAL LOW

## 2011-08-23 LAB — CARDIAC PANEL(CRET KIN+CKTOT+MB+TROPI)
CK, MB: 4
Total CK: 143

## 2011-08-23 LAB — ABO/RH: ABO/RH(D): A POS

## 2011-08-23 LAB — POCT CARDIAC MARKERS
Myoglobin, poc: 117
Operator id: 146091

## 2011-08-23 LAB — IRON AND TIBC
Saturation Ratios: 3 — ABNORMAL LOW
TIBC: 395
UIBC: 383

## 2011-08-23 LAB — PROTIME-INR
INR: 1.1
Prothrombin Time: 14

## 2011-08-23 LAB — VITAMIN B12: Vitamin B-12: 324 (ref 211–911)

## 2012-11-13 ENCOUNTER — Encounter (HOSPITAL_COMMUNITY): Payer: Self-pay

## 2012-11-13 ENCOUNTER — Emergency Department (HOSPITAL_COMMUNITY)
Admission: EM | Admit: 2012-11-13 | Discharge: 2012-11-13 | Disposition: A | Discharge status: Home / Self Care | Payer: Medicare Other | Attending: Emergency Medicine | Admitting: Emergency Medicine

## 2012-11-13 DIAGNOSIS — Z79899 Other long term (current) drug therapy: Secondary | ICD-10-CM | POA: Insufficient documentation

## 2012-11-13 DIAGNOSIS — K5900 Constipation, unspecified: Secondary | ICD-10-CM | POA: Insufficient documentation

## 2012-11-13 DIAGNOSIS — I10 Essential (primary) hypertension: Secondary | ICD-10-CM | POA: Insufficient documentation

## 2012-11-13 DIAGNOSIS — Z8739 Personal history of other diseases of the musculoskeletal system and connective tissue: Secondary | ICD-10-CM | POA: Insufficient documentation

## 2012-11-13 HISTORY — DX: Unspecified osteoarthritis, unspecified site: M19.90

## 2012-11-13 HISTORY — DX: Essential (primary) hypertension: I10

## 2012-11-13 NOTE — ED Notes (Signed)
Pt reports last normal bm 2 days ago, has taken multiple laxatives. Now feels like hemorrhoids are hanging out.

## 2012-11-13 NOTE — ED Notes (Signed)
Pt disimpacted by dr.cook. Moderate amount of stool removed, pt up to bsc, more stool expelled.

## 2012-11-13 NOTE — ED Provider Notes (Signed)
History   This chart was scribed for Donnetta Hutching, MD by Gerlean Ren, ED Scribe. This patient was seen in room APA18/APA18 and the patient's care was started at 11:20 AM    CSN: [id number]  Arrival date & time 11/13/12  1058   First MD Initiated Contact with Patient 11/13/12 1119      Chief Complaint  Patient presents with  . Hemorrhoids     The history is provided by the patient. No language interpreter was used.   Reginald Waller is a 78 y.o. male with h/o HTN and hemorrhoids who presents to the Emergency Department complaining of 2 days of constipation during which time he has not had any bowel movements.  Pt states he has taken multiple laxatives today and had had moderate to severe hemorrhoid pain since.  Pt denies tobacco and alcohol use.  Past Medical History  Diagnosis Date  . Hypertension   . Arthritis     History reviewed. No pertinent past surgical history.  No family history on file.  History  Substance Use Topics  . Smoking status: Never Smoker   . Smokeless tobacco: Not on file  . Alcohol Use: No      Review of Systems A complete 10 system review of systems was obtained and all systems are negative except as noted in the HPI and PMH.   Allergies  Review of patient's allergies indicates not on file.  Home Medications  No current outpatient prescriptions on file.  BP 151/68  Resp 18  SpO2 99%  Physical Exam  Nursing note and vitals reviewed. Constitutional: He is oriented to person, place, and time. He appears well-developed and well-nourished.  HENT:  Head: Normocephalic and atraumatic.  Eyes: Conjunctivae normal and EOM are normal. Pupils are equal, round, and reactive to light.  Neck: Normal range of motion. Neck supple.  Cardiovascular: Normal rate, regular rhythm and normal heart sounds.   Pulmonary/Chest: Effort normal and breath sounds normal.  Abdominal: Soft. Bowel sounds are normal.  Genitourinary:       Large amount of brown stool in  rectum  Musculoskeletal: Normal range of motion.  Neurological: He is alert and oriented to person, place, and time.  Skin: Skin is warm and dry.  Psychiatric: He has a normal mood and affect.    ED Course  Procedures (including critical care time) .DIAGNOSTIC STUDIES: Oxygen Saturation is 99% on room air, normal by my interpretation.    COORDINATION OF CARE: 11:23 AM-.Marland KitchenMarland KitchenMarland KitchenMarland Kitchenprocedure:     Manual disimpaction with significant amount of stool retrieved.  Applied fleet's enema.     Labs Reviewed - No data to display No results found.   No diagnosis found.    MDM  Manual disimpaction by  physician with addition of fleets enema.   Patient had copious bowel movement. Feels much better    I personally performed the services described in this documentation, which was scribed in my presence. The recorded information has been reviewed and is accurate.         Donnetta Hutching, MD 11/13/12 [phone]

## 2012-11-13 NOTE — ED Notes (Signed)
Pt up to bsc, large formed stool noted in bsc, pt stated he was feeling much better. Drinking cold water. Wife at bedside at present, linens changed, pt had soiled them when getting out of bed

## 2012-11-13 NOTE — ED Notes (Signed)
Pt stated he was feeling much better. Wife at bedside. md notified of pts current status.

## 2012-11-26 ENCOUNTER — Encounter (HOSPITAL_COMMUNITY): Payer: Self-pay

## 2012-11-26 ENCOUNTER — Emergency Department (HOSPITAL_COMMUNITY)
Admission: EM | Admit: 2012-11-26 | Discharge: 2012-11-26 | Disposition: A | Discharge status: Home / Self Care | Payer: Medicare Other | Attending: Emergency Medicine | Admitting: Emergency Medicine

## 2012-11-26 DIAGNOSIS — K5900 Constipation, unspecified: Secondary | ICD-10-CM

## 2012-11-26 DIAGNOSIS — Z8739 Personal history of other diseases of the musculoskeletal system and connective tissue: Secondary | ICD-10-CM | POA: Insufficient documentation

## 2012-11-26 DIAGNOSIS — Z79899 Other long term (current) drug therapy: Secondary | ICD-10-CM | POA: Insufficient documentation

## 2012-11-26 DIAGNOSIS — I10 Essential (primary) hypertension: Secondary | ICD-10-CM | POA: Insufficient documentation

## 2012-11-26 DIAGNOSIS — K5641 Fecal impaction: Secondary | ICD-10-CM

## 2012-11-26 DIAGNOSIS — Z8673 Personal history of transient ischemic attack (TIA), and cerebral infarction without residual deficits: Secondary | ICD-10-CM | POA: Insufficient documentation

## 2012-11-26 HISTORY — DX: Cerebral infarction, unspecified: I63.9

## 2012-11-26 MED ORDER — POLYETHYLENE GLYCOL 3350 17 GM/SCOOP PO POWD: 17.0000 g | Freq: Every day | ORAL | Status: DC

## 2012-11-26 MED ORDER — DISPOSABLE ENEMA 19-7 GM/118ML RE ENEM: 1.0000 | ENEMA | Freq: Two times a day (BID) | RECTAL | Status: DC | PRN

## 2012-11-26 NOTE — ED Provider Notes (Signed)
History   This chart was scribed for Lyanne Co, MD by Sofie Rower, ED Scribe. The patient was seen in room APA07/APA07 and the patient's care was started at 2:49PM.    CSN: [id number]  Arrival date & time 11/26/12  1304   First MD Initiated Contact with Patient 11/26/12 1449      Chief Complaint  Patient presents with  . Constipation    (Consider location/radiation/quality/duration/timing/severity/associated sxs/prior treatment) The history is provided by the patient and the spouse.    Reginald Waller is a 79 y.o. male , with a hx of hypertension, hemorrhoids, constipation, and colonoscopy procedure (Performed on 11/16/12)  who presents to the Emergency Department complaining of  gradual, progressively worsening, constipation, onset three days ago (11/23/12).  Associated symptoms include rectal pain and nausea. The pt reports he has not experienced a bowel movement for the past three days, which he believes has given rise to his rectal pain at present time. The pt has tried enema applications which provide moderate relief of the constipation and taken a repository treatment which does not provide relief of the constipation.  The pt denies experiencing any fever.  The pt does not smoke or drink alcohol.   PCP is Dr. Regino Schultze.    Past Medical History  Diagnosis Date  . Hypertension   . Arthritis   . Stroke     Past Surgical History  Procedure Date  . Hernia repair   . Cataract extraction   . Appendectomy   . Eye surgery     No family history on file.  History  Substance Use Topics  . Smoking status: Never Smoker   . Smokeless tobacco: Not on file  . Alcohol Use: No      Review of Systems  10 Systems reviewed and all are negative for acute change except as noted in the HPI.    Allergies  Shellfish allergy  Home Medications   Current Outpatient Rx  Name  Route  Sig  Dispense  Refill  . AMLODIPINE BESYLATE 10 MG PO TABS   Oral   Take 10 mg by mouth  daily.         . CHLORPHEN-PSEUDOEPHED-APAP 2-30-325 MG PO TABS   Oral   Take 1 tablet by mouth daily as needed. Mucous and congestion         . LATANOPROST 0.005 % OP SOLN   Both Eyes   Place 1 drop into both eyes at bedtime.         Marland Kitchen LEVOTHYROXINE SODIUM 75 MCG PO TABS   Oral   Take 75 mcg by mouth daily.         Marland Kitchen PHENYLEPHRINE HCL 0.5 % NA SOLN   Nasal   Place 1 drop into the nose every 4 (four) hours as needed. Nasal congestion         . SIMVASTATIN 20 MG PO TABS   Oral   Take 20 mg by mouth daily.           BP 185/57  Pulse 89  Temp 97.6 F (36.4 C) (Oral)  Resp 20  Ht 5\' 11"  (1.803 m)  Wt 160 lb (72.576 kg)  BMI 22.32 kg/m2  SpO2 100%  Physical Exam  Nursing note and vitals reviewed. Constitutional: He is oriented to person, place, and time. He appears well-developed and well-nourished.  HENT:  Head: Normocephalic and atraumatic.  Eyes: EOM are normal.  Neck: Normal range of motion.  Cardiovascular: Normal rate, regular rhythm,  normal heart sounds and intact distal pulses.   Pulmonary/Chest: Effort normal and breath sounds normal. No respiratory distress.  Abdominal: Soft. He exhibits no distension. There is no tenderness.  Genitourinary: Rectum normal.       External rectum normal. No hemorrhoids. Large fecal impaction present.  Musculoskeletal: Normal range of motion.  Neurological: He is alert and oriented to person, place, and time.  Skin: Skin is warm and dry.  Psychiatric: He has a normal mood and affect. Judgment normal.    ED Course  Fecal disimpaction Performed by: Lyanne Co Authorized by: Lyanne Co Consent: Verbal consent obtained. Consent given by: patient Patient identity confirmed: verbally with patient Time out: Immediately prior to procedure a "time out" was called to verify the correct patient, procedure, equipment, support staff and site/side marked as required. Local anesthesia used: no Patient sedated:  no Patient tolerance: Patient tolerated the procedure well with no immediate complications. Comments: Partial removal of hard brown stool.   (including critical care time)  DIAGNOSTIC STUDIES: Oxygen Saturation is 100% on room air, normal by my interpretation.    COORDINATION OF CARE:  3:16 PM- Treatment plan discussed with patient. Pt agrees with treatment.  3:18 PM- Rectal exam and manual disimpaction performed. Treatment plan discussed with patient. Pt agrees with treatment.      .    Labs Reviewed - No data to display No results found.   1. Constipation   2. Fecal impaction       MDM  Abdominal exam benign.  Large fecal stool burden.  Partial disimpaction.  The patient was unable to tolerate additional disimpaction.  Home with MiraLAX and instructions for enemas.      I personally performed the services described in this documentation, which was scribed in my presence. The recorded information has been reviewed and is accurate.      Lyanne Co, MD 11/26/12 [phone]

## 2012-11-26 NOTE — ED Notes (Signed)
Pt reports was here a few days before Christmas with constipation.  Says was given an enema and had several BM for the next 5 days.  Pt says now can't have a BM.   Pt says feels like has to have BM but can't.  Also says rectum has been sore and swollen since was seen here.  LBM was 3 days ago.

## 2012-12-04 ENCOUNTER — Emergency Department (HOSPITAL_COMMUNITY): Payer: Medicare Other

## 2012-12-04 ENCOUNTER — Encounter (HOSPITAL_COMMUNITY): Payer: Self-pay | Admitting: *Deleted

## 2012-12-04 ENCOUNTER — Inpatient Hospital Stay (HOSPITAL_COMMUNITY)
Admission: EM | Admit: 2012-12-04 | Discharge: 2012-12-12 | DRG: 668 | Disposition: A | Discharge status: Home Health | Payer: Medicare Other | Attending: Internal Medicine | Admitting: Internal Medicine

## 2012-12-04 DIAGNOSIS — D62 Acute posthemorrhagic anemia: Secondary | ICD-10-CM | POA: Diagnosis not present

## 2012-12-04 DIAGNOSIS — J90 Pleural effusion, not elsewhere classified: Secondary | ICD-10-CM

## 2012-12-04 DIAGNOSIS — J9811 Atelectasis: Secondary | ICD-10-CM

## 2012-12-04 DIAGNOSIS — M1710 Unilateral primary osteoarthritis, unspecified knee: Secondary | ICD-10-CM | POA: Diagnosis present

## 2012-12-04 DIAGNOSIS — E875 Hyperkalemia: Secondary | ICD-10-CM | POA: Diagnosis present

## 2012-12-04 DIAGNOSIS — Z79899 Other long term (current) drug therapy: Secondary | ICD-10-CM

## 2012-12-04 DIAGNOSIS — M19019 Primary osteoarthritis, unspecified shoulder: Secondary | ICD-10-CM | POA: Diagnosis present

## 2012-12-04 DIAGNOSIS — E860 Dehydration: Secondary | ICD-10-CM

## 2012-12-04 DIAGNOSIS — N1330 Unspecified hydronephrosis: Secondary | ICD-10-CM

## 2012-12-04 DIAGNOSIS — E039 Hypothyroidism, unspecified: Secondary | ICD-10-CM

## 2012-12-04 DIAGNOSIS — R310 Gross hematuria: Secondary | ICD-10-CM | POA: Diagnosis present

## 2012-12-04 DIAGNOSIS — IMO0002 Reserved for concepts with insufficient information to code with codable children: Secondary | ICD-10-CM | POA: Diagnosis not present

## 2012-12-04 DIAGNOSIS — N3289 Other specified disorders of bladder: Secondary | ICD-10-CM

## 2012-12-04 DIAGNOSIS — N390 Urinary tract infection, site not specified: Secondary | ICD-10-CM

## 2012-12-04 DIAGNOSIS — Z6825 Body mass index (BMI) 25.0-25.9, adult: Secondary | ICD-10-CM

## 2012-12-04 DIAGNOSIS — K5900 Constipation, unspecified: Secondary | ICD-10-CM | POA: Diagnosis present

## 2012-12-04 DIAGNOSIS — R339 Retention of urine, unspecified: Secondary | ICD-10-CM | POA: Diagnosis present

## 2012-12-04 DIAGNOSIS — E669 Obesity, unspecified: Secondary | ICD-10-CM | POA: Diagnosis present

## 2012-12-04 DIAGNOSIS — L03114 Cellulitis of left upper limb: Secondary | ICD-10-CM

## 2012-12-04 DIAGNOSIS — J189 Pneumonia, unspecified organism: Secondary | ICD-10-CM

## 2012-12-04 DIAGNOSIS — N139 Obstructive and reflux uropathy, unspecified: Secondary | ICD-10-CM | POA: Diagnosis present

## 2012-12-04 DIAGNOSIS — J9819 Other pulmonary collapse: Secondary | ICD-10-CM | POA: Diagnosis not present

## 2012-12-04 DIAGNOSIS — R0602 Shortness of breath: Secondary | ICD-10-CM | POA: Diagnosis not present

## 2012-12-04 DIAGNOSIS — C679 Malignant neoplasm of bladder, unspecified: Principal | ICD-10-CM | POA: Diagnosis present

## 2012-12-04 DIAGNOSIS — I10 Essential (primary) hypertension: Secondary | ICD-10-CM

## 2012-12-04 DIAGNOSIS — N179 Acute kidney failure, unspecified: Secondary | ICD-10-CM

## 2012-12-04 DIAGNOSIS — Z8673 Personal history of transient ischemic attack (TIA), and cerebral infarction without residual deficits: Secondary | ICD-10-CM

## 2012-12-04 HISTORY — DX: Unspecified malaria: B54

## 2012-12-04 LAB — URINE MICROSCOPIC-ADD ON

## 2012-12-04 LAB — URINALYSIS, ROUTINE W REFLEX MICROSCOPIC
Glucose, UA: NEGATIVE mg/dL
Leukocytes, UA: NEGATIVE
pH: 6 (ref 5.0–8.0)

## 2012-12-04 LAB — BASIC METABOLIC PANEL
GFR calc Af Amer: 31 mL/min — ABNORMAL LOW (ref >90)
GFR calc non Af Amer: 27 mL/min — ABNORMAL LOW (ref >90)
Glucose, Bld: 117 mg/dL — ABNORMAL HIGH (ref 70–99)
Potassium: 5.7 mEq/L — ABNORMAL HIGH (ref 3.5–5.1)
Sodium: 135 mEq/L (ref 135–145)

## 2012-12-04 LAB — CBC WITH DIFFERENTIAL/PLATELET
Hemoglobin: 10.5 g/dL — ABNORMAL LOW (ref 13.0–17.0)
Lymphs Abs: 1.4 10*3/uL (ref 0.7–4.0)
Monocytes Relative: 12 % (ref 3–12)
Neutro Abs: 6.9 10*3/uL (ref 1.7–7.7)
Neutrophils Relative %: 70 % (ref 43–77)
RBC: 3.37 MIL/uL — ABNORMAL LOW (ref 4.22–5.81)

## 2012-12-04 MED ORDER — DEXTROSE 5 % IV SOLN
1.0000 g | Freq: Once | INTRAVENOUS | Status: AC
  Administered 2012-12-04: 1 g via INTRAVENOUS
  Filled 2012-12-04: qty 10

## 2012-12-04 MED ORDER — HYDROMORPHONE HCL PF 1 MG/ML IJ SOLN
0.5000 mg | INTRAMUSCULAR | Status: DC | PRN
  Administered 2012-12-04: 1 mg via INTRAVENOUS
  Filled 2012-12-04: qty 1

## 2012-12-04 MED ORDER — HYDRALAZINE HCL 20 MG/ML IJ SOLN
10.0000 mg | INTRAMUSCULAR | Status: DC | PRN
  Administered 2012-12-07: 10 mg via INTRAVENOUS
  Filled 2012-12-04: qty 1

## 2012-12-04 MED ORDER — FENTANYL CITRATE 0.05 MG/ML IJ SOLN
50.0000 ug | Freq: Once | INTRAMUSCULAR | Status: AC
  Administered 2012-12-04: 50 ug via INTRAVENOUS
  Filled 2012-12-04: qty 2

## 2012-12-04 MED ORDER — LEVOTHYROXINE SODIUM 75 MCG PO TABS
75.0000 ug | ORAL_TABLET | Freq: Every day | ORAL | Status: DC
  Administered 2012-12-05 – 2012-12-06 (×2): 75 ug via ORAL
  Filled 2012-12-04 (×2): qty 1

## 2012-12-04 MED ORDER — SODIUM CHLORIDE 0.9 % IJ SOLN
3.0000 mL | Freq: Two times a day (BID) | INTRAMUSCULAR | Status: DC
  Administered 2012-12-09 – 2012-12-11 (×3): 3 mL via INTRAVENOUS

## 2012-12-04 MED ORDER — TRAZODONE HCL 50 MG PO TABS
50.0000 mg | ORAL_TABLET | Freq: Every evening | ORAL | Status: DC | PRN
  Administered 2012-12-07 – 2012-12-10 (×3): 50 mg via ORAL
  Filled 2012-12-04 (×3): qty 1

## 2012-12-04 MED ORDER — ONDANSETRON HCL 4 MG/2ML IJ SOLN: 4.0000 mg | INTRAMUSCULAR | Status: DC | PRN

## 2012-12-04 MED ORDER — LORATADINE 10 MG PO TABS
10.0000 mg | ORAL_TABLET | Freq: Every day | ORAL | Status: DC
  Administered 2012-12-05 – 2012-12-12 (×8): 10 mg via ORAL
  Filled 2012-12-04 (×9): qty 1

## 2012-12-04 MED ORDER — FLEET ENEMA 7-19 GM/118ML RE ENEM: 1.0000 | ENEMA | Freq: Once | RECTAL | Status: AC | PRN

## 2012-12-04 MED ORDER — SIMVASTATIN 20 MG PO TABS
20.0000 mg | ORAL_TABLET | Freq: Every day | ORAL | Status: DC
  Administered 2012-12-05 – 2012-12-11 (×7): 20 mg via ORAL
  Filled 2012-12-04 (×7): qty 1

## 2012-12-04 MED ORDER — TERAZOSIN HCL 1 MG PO CAPS
1.0000 mg | ORAL_CAPSULE | Freq: Every day | ORAL | Status: DC
  Administered 2012-12-04 – 2012-12-11 (×8): 1 mg via ORAL
  Filled 2012-12-04 (×8): qty 1

## 2012-12-04 MED ORDER — SODIUM CHLORIDE 0.9 % IV BOLUS (SEPSIS)
500.0000 mL | Freq: Once | INTRAVENOUS | Status: AC
  Administered 2012-12-04: 500 mL via INTRAVENOUS

## 2012-12-04 MED ORDER — SODIUM CHLORIDE 0.9 % IV SOLN
INTRAVENOUS | Status: DC
  Administered 2012-12-04: 500 mL via INTRAVENOUS
  Administered 2012-12-05: 23:00:00 via INTRAVENOUS
  Administered 2012-12-05: 1000 mL via INTRAVENOUS
  Administered 2012-12-06 – 2012-12-07 (×5): via INTRAVENOUS

## 2012-12-04 MED ORDER — ACETAMINOPHEN 325 MG PO TABS
650.0000 mg | ORAL_TABLET | ORAL | Status: DC | PRN
  Administered 2012-12-07 – 2012-12-10 (×2): 650 mg via ORAL
  Filled 2012-12-04 (×2): qty 2

## 2012-12-04 MED ORDER — DEXTROSE 5 % IV SOLN
1.0000 g | INTRAVENOUS | Status: DC
  Administered 2012-12-05 – 2012-12-08 (×4): 1 g via INTRAVENOUS
  Filled 2012-12-04 (×5): qty 10

## 2012-12-04 MED ORDER — POLYETHYLENE GLYCOL 3350 17 G PO PACK
17.0000 g | PACK | Freq: Every day | ORAL | Status: DC
  Administered 2012-12-06 – 2012-12-12 (×7): 17 g via ORAL
  Filled 2012-12-04 (×10): qty 1

## 2012-12-04 MED ORDER — SORBITOL 70 % SOLN: 30.0000 mL | Freq: Every day | Status: DC | PRN

## 2012-12-04 MED ORDER — LATANOPROST 0.005 % OP SOLN
OPHTHALMIC | Status: AC
  Filled 2012-12-04: qty 2.5

## 2012-12-04 MED ORDER — LATANOPROST 0.005 % OP SOLN
1.0000 [drp] | Freq: Every day | OPHTHALMIC | Status: DC
  Administered 2012-12-04 – 2012-12-11 (×8): 1 [drp] via OPHTHALMIC
  Filled 2012-12-04: qty 2.5

## 2012-12-04 NOTE — H&P (Signed)
[facility] History and Physical  Reginald Waller  FAO:[id number]  DOB: 08/02/1919   DOA: 12/04/2012   PCP:   Kirk Ruths, MD   Chief Complaint:  Abdominal and back pain  HPI: Reginald Waller is an 79 y.o. male. [employer] War [employer] veteran, having abdominal back pain and constipation for the past few weeks, got much worse last night and came to the emergency room today to be evaluated. Developed severe pain while in the emergency room and a CT scan was done which showed a bladder mass likely compressing the ureter, and causing the observed hydronephrosis.  The patient was discussed between emergency room physician and Dr. Jerre Simon and plans have been made for admission for evaluation and management by Dr. Jerre Simon tomorrow.  Prior to his recent troubles this gentleman considered himself to be in very good health.     Rewiew of Systems:   Hard of hearing  All systems negative except as marked bold or noted in the HPI;  Constitutional: malaise, fever and chills. ;  Eyes: eye pain, redness and discharge. ;  ENMT: ear pain, hoarseness, nasal congestion, sinus pressure and sore throat. ;  Cardiovascular: chest pain, palpitations, diaphoresis, dyspnea and peripheral edema. ;  Respiratory: cough, hemoptysis, wheezing and stridor. ;  Gastrointestinal: nausea, vomiting, diarrhea, constipation, abdominal pain, melena, blood in stool, hematemesis, jaundice and rectal bleeding. unusual weight loss..  Genitourinary: frequency, dysuria, incontinence,flank pain and hematuria; nocturia  Musculoskeletal: back pain and neck pain. swelling and trauma.;  Skin: . pruritus, rash, abrasions, bruising and skin lesion.; ulcerations  Neuro: headache, lightheadedness and neck stiffness. weakness, altered level of consciousness , altered mental status, extremity weakness, burning feet, involuntary movement, seizure and syncope.  Psych: anxiety, depression, insomnia, tearfulness, panic attacks, hallucinations,  paranoia, suicidal or homicidal ideation    Past Medical History  Diagnosis Date  . Hypertension   . Arthritis   . Stroke   . Malaria     Past Surgical History  Procedure Date  . Hernia repair   . Cataract extraction   . Appendectomy   . Eye surgery     Medications:  HOME MEDS: Prior to Admission medications   Medication Sig Start Date End Date Taking? Authorizing Provider  acetaminophen (ARTHRITIS PAIN RELIEVER) 650 MG CR tablet Take 650 mg by mouth daily as needed. For pain   Yes Historical Provider, MD  ferrous sulfate 325 (65 FE) MG tablet Take 325 mg by mouth every morning.   Yes Historical Provider, MD  latanoprost (XALATAN) 0.005 % ophthalmic solution Place 1 drop into both eyes at bedtime.   Yes Historical Provider, MD  levothyroxine (SYNTHROID, LEVOTHROID) 75 MCG tablet Take 75 mcg by mouth every morning.    Yes Historical Provider, MD  phenylephrine (NEO-SYNEPHRINE) 0.5 % nasal solution Place 1 drop into the nose every 4 (four) hours as needed. Nasal congestion   Yes Historical Provider, MD  polyethylene glycol powder (MIRALAX) powder Take 17 g by mouth daily. 11/26/12  Yes Lyanne Co, MD  simvastatin (ZOCOR) 20 MG tablet Take 20 mg by mouth every morning.    Yes Historical Provider, MD  terazosin (HYTRIN) 1 MG capsule Take 1 mg by mouth at bedtime.   Yes Historical Provider, MD  Chlorphen-Pseudoephed-APAP (CORICIDIN D) 2-30-325 MG TABS Take 1 tablet by mouth daily as needed. Mucous and congestion    Historical Provider, MD     Allergies:  Allergies  Allergen Reactions  . Shellfish Allergy Other (See Comments)  Swelling of throat and tongue.    Social History:   reports that he has never smoked. He does not have any smokeless tobacco history on file. He reports that he does not drink alcohol or use illicit drugs.  Family History: Family History  Problem Relation Age of Onset  . Diabetes Mother   . Diabetes Father   . Cancer Sister     breast  .  Cancer Sister     thyroid  . Cancer Brother     leukemia  . Cancer Brother   . Coronary artery disease Brother      Physical Exam: Filed Vitals:   12/04/12 1507 12/04/12 1800 12/04/12 1900 12/04/12 2000  BP: 170/40 192/132 195/71 213/65  Pulse: 73 72 75 74  Temp: 98.1 F (36.7 C)     TempSrc: Oral     Resp: 24 23 22 21   Height: 5\' 11"  (1.803 m)     Weight: 72.576 kg (160 lb)     SpO2: 100% 98% 99% 100%   Blood pressure 213/65, pulse 74, temperature 98.1 F (36.7 C), temperature source Oral, resp. rate 21, height 5\' 11"  (1.803 m), weight 72.576 kg (160 lb), SpO2 100.00%.  GEN:  Pleasant elderly Caucasian gentleman lying in the stretcher; quite hard of hearing complaining of abdominal pain cooperative with exam PSYCH:  alert and oriented x4; does not appear anxious or depressed; affect is appropriate. HEENT: Mucous membranes pink and anicteric; PERRLA; EOM intact; no cervical lymphadenopathy nor thyromegaly or carotid bruit; no JVD; Breasts:: Not examined CHEST WALL: No tenderness CHEST: Normal respiration, clear to auscultation bilaterally HEART: Regular rate and rhythm; no murmurs rubs or gallops BACK: No kyphosis or scoliosis;  ABDOMEN: Obese, soft, generalized tenderness; no rebound; no masses, no organomegaly, normal abdominal bowel sounds; no pannus; no intertriginous candida. Rectal Exam: Not done EXTREMITIES:  age-appropriate arthropathy of the hands and knees; no edema; no ulcerations. Genitalia: not examined PULSES: 2+ and symmetric SKIN: Normal hydration no rash or ulceration CNS: Cranial nerves 2-12 grossly intact no focal lateralizing neurologic deficit   Labs on Admission:  Basic Metabolic Panel:  Lab 12/04/12 1914  NA 135  K 5.7*  CL 105  CO2 23  GLUCOSE 117*  BUN 36*  CREATININE 2.04*  CALCIUM 8.9  MG --  PHOS --   Liver Function Tests: No results found for this basename: AST:5,ALT:5,ALKPHOS:5,BILITOT:5,PROT:5,ALBUMIN:5 in the last 168 hours No  results found for this basename: LIPASE:5,AMYLASE:5 in the last 168 hours No results found for this basename: AMMONIA:5 in the last 168 hours CBC:  Lab 12/04/12 1613  WBC 9.9  NEUTROABS 6.9  HGB 10.5*  HCT 32.2*  MCV 95.5  PLT 416*   Cardiac Enzymes: No results found for this basename: CKTOTAL:5,CKMB:5,CKMBINDEX:5,TROPONINI:5 in the last 168 hours BNP: No components found with this basename: POCBNP:5 D-dimer: No components found with this basename: D-DIMER:5 CBG: No results found for this basename: GLUCAP:5 in the last 168 hours  Radiological Exams on Admission: Ct Abdomen Pelvis Wo Contrast  12/04/2012  *RADIOLOGY REPORT*  Clinical Data: Left lower quadrant and left flank pain.  Low back pain.  CT ABDOMEN AND PELVIS WITHOUT CONTRAST  Technique:  Multidetector CT imaging of the abdomen and pelvis was performed following the standard protocol without intravenous contrast.  Comparison: 12/11/2004.  Findings: Lung Bases: Small bilateral pleural effusions.  Basilar atelectasis.  Coronary artery atherosclerosis is present.  Severe mitral annular calcification.  Liver:  Unenhanced CT was performed per clinician order.  Lack  of IV contrast limits sensitivity and specificity, especially for evaluation of abdominal/pelvic solid viscera.  Old granulomatous calcifications.  Spleen:  Old granulomatous disease.  Gallbladder:  Dependently layering calcified gallstones.  Common bile duct:  Within normal limits for age.  Pancreas:  Stable prominence of the pancreatic duct.  Scattered pancreatic calcifications, likely associated with prior pancreatitis.  Adrenal glands:  Stable thickening of the left adrenal gland, likely representing adenoma.  Kidneys:  Severe left hydronephrosis.  Left hydroureter is present. Vascular calcifications are present in the kidneys.  No collecting system calculi.  No ureteral calculi.  The left hydroureter extends to the urinary bladder where there is mural thickening on the left  side of the trigone, suspicious for an obstructing neoplasm.  Right kidney and ureter appear within normal limits.  Stomach:  Grossly normal.  Decompressed.  Small bowel:  Normal.  Colon:   High density along the cecum compatible with prior appendectomy.  Colonic diverticulosis without diverticulitis. There is thickening of the rectum.  Follow-up colonoscopy recommended.  Pelvic Genitourinary:  High attenuation of the left side of the bladder trigone as described above.  No free fluid.  Prostate gland appears within normal limits.  No inguinal adenopathy.  No pelvic sidewall adenopathy.  Bones:  Lumbar spondylosis with anterolisthesis of L4 on L5.  Old T11 compression fracture.  Vasculature: Severe atherosclerosis without aneurysm.  IMPRESSION: 1.  Severe left hydroureteronephrosis extending to the left UVJ. There is soft tissue attenuation in the left side of the bladder trigone, raising the possibility of neoplasm producing obstruction of the distal left ureter. No ureteral calculi are seen. Cystoscopy recommended. 2.  Thickening of the rectum is nonspecific.  Follow-up endoscopy is recommended. 3.  Small bilateral pleural effusions and basilar atelectasis. 4.  Cholelithiasis without CT evidence of cholecystitis. 5.  Atherosclerosis and coronary artery disease.   Original Report Authenticated By: Andreas Newport, M.D.    Dg Chest 2 View  12/04/2012  *RADIOLOGY REPORT*  Clinical Data: Chronic cough  CHEST - 2 VIEW  Comparison: 05/13/2008  Findings: The heart and pulmonary vascularity are within normal limits.  Some atelectasis is seen in the left lung base. Hyperinflation consistent with COPD is noted.  No acute bony abnormality is seen.  IMPRESSION: Atelectasis versus scarring in the left lung base.   Original Report Authenticated By: Alcide Clever, M.D.    Dg Hip Complete Left  12/04/2012  *RADIOLOGY REPORT*  Clinical Data: Hip pain  LEFT HIP - COMPLETE 2+ VIEW  Comparison: None.  Findings: Four views of the  left hip submitted.  No acute fracture or subluxation.  Mild degenerative changes noted bilateral hip joint with marginal acetabular spurring. Mild atherosclerotic calcifications of femoral arteries. Pelvic phleboliths are noted. Mild degenerative changes bilateral inferior aspect SI joints.  IMPRESSION: No acute fracture or subluxation.  Mild degenerative changes bilateral hip joints.   Original Report Authenticated By: Natasha Mead, M.D.     EKG: Independently reviewed. First degree AV block; poor R wave progression chest leads   Assessment/Plan Present on Admission:   Acute renal failure due to obstructive uropathy Bladder mass causing obstruction Hydronephrosis due to the above Hyperkalemia Urinary tract infection Hypertension uncontrolled Hypothyroidism   PLAN: We'll admit this gentleman for pain control hydration, and management of his high blood pressure. Will start empiric Rocephin for his urinary tract infection pending results of cultures He'll be seen by urology in the morning for definitive management of his bladder mass and ureteral. Cautious narcotics for pain  Other plans as per orders.  FULL CODE  Family Communication: His wife is present at the interview examination and discussion of plan Disposition Plan: Return to home when stable   Cantrell Martus Nocturnist [facility] Pager [phone]   12/04/2012, 9:51 PM

## 2012-12-04 NOTE — ED Notes (Addendum)
Lt hip pain , onset this am.  Points to hip area, but also points to LLQ. No known injury,  Nasal congestion.  Feels sob. And nauseated

## 2012-12-04 NOTE — ED Provider Notes (Signed)
History   This chart was scribed for Joya Gaskins, MD, by Frederik Pear, ER scribe. The patient was seen in room APA19/APA19 and the patient's care was started at 1536.    CSN: [id number]  Arrival date & time 12/04/12  1455   First MD Initiated Contact with Patient 12/04/12 1536      Chief Complaint  Patient presents with  . Hip Pain   Patient is a 79 y.o. male presenting with hip pain. The history is provided by the patient. No language interpreter was used.  Hip Pain This is a new problem. The current episode started 6 to 12 hours ago. The problem occurs constantly. The problem has not changed since onset.Associated symptoms include shortness of breath. Pertinent negatives include no chest pain and no abdominal pain. Nothing aggravates the symptoms. Nothing relieves the symptoms. He has tried nothing for the symptoms.    Reginald Waller is a 79 y.o. male who presents to the Emergency Department complaining of constant, moderate left hip pain that began this morning. He also complains of SOB. He denies any emesis, back pain, chest pain, or diarrhea. He denies any injury to the area. He has a h/o of arthritis in his knees and shoulders.  PCP is Dr. Regino Schultze.  Past Medical History  Diagnosis Date  . Hypertension   . Arthritis   . Stroke   . Malaria     Past Surgical History  Procedure Date  . Hernia repair   . Cataract extraction   . Appendectomy   . Eye surgery     History reviewed. No pertinent family history.  History  Substance Use Topics  . Smoking status: Never Smoker   . Smokeless tobacco: Not on file  . Alcohol Use: No      Review of Systems  Respiratory: Positive for shortness of breath.   Cardiovascular: Negative for chest pain.  Gastrointestinal: Negative for abdominal pain.  Musculoskeletal:       Left hip pain.  All other systems reviewed and are negative.    Allergies  Shellfish allergy  Home Medications   Current Outpatient Rx  Name   Route  Sig  Dispense  Refill  . ACETAMINOPHEN ER 650 MG PO TBCR   Oral   Take 650 mg by mouth daily as needed. For pain         . FERROUS SULFATE 325 (65 FE) MG PO TABS   Oral   Take 325 mg by mouth every morning.         Marland Kitchen LATANOPROST 0.005 % OP SOLN   Both Eyes   Place 1 drop into both eyes at bedtime.         Marland Kitchen LEVOTHYROXINE SODIUM 75 MCG PO TABS   Oral   Take 75 mcg by mouth every morning.          Marland Kitchen PHENYLEPHRINE HCL 0.5 % NA SOLN   Nasal   Place 1 drop into the nose every 4 (four) hours as needed. Nasal congestion         . POLYETHYLENE GLYCOL 3350 PO POWD   Oral   Take 17 g by mouth daily.   255 g   0   . SIMVASTATIN 20 MG PO TABS   Oral   Take 20 mg by mouth every morning.          Marland Kitchen TERAZOSIN HCL 1 MG PO CAPS   Oral   Take 1 mg by mouth at bedtime.         Marland Kitchen  CHLORPHEN-PSEUDOEPHED-APAP 2-30-325 MG PO TABS   Oral   Take 1 tablet by mouth daily as needed. Mucous and congestion           BP 170/40  Pulse 73  Temp 98.1 F (36.7 C) (Oral)  Resp 24  Ht 5\' 11"  (1.803 m)  Wt 160 lb (72.576 kg)  BMI 22.32 kg/m2  SpO2 100%  Physical Exam  CONSTITUTIONAL: Well developed/well nourished HEAD AND FACE: Normocephalic/atraumatic EYES: EOMI/PERRL ENMT: Mucous membranes moist NECK: supple no meningeal signs SPINE:entire spine nontender CV: S1/S2 noted, no murmurs/rubs/gallops noted LUNGS: Lungs are clear to auscultation bilaterally, no apparent distress ABDOMEN: soft, mild tenderness to LLQ, no rebound or guarding GU:no cva tenderness, no hernia or testicular tenderness with a chaperone present NEURO: Pt is awake/alert, moves all extremitiesx4 EXTREMITIES: pulses normal, full ROM, no deformity or tenderness to hip SKIN: warm, color normal PSYCH: no abnormalities of mood noted  ED Course  Procedures   DIAGNOSTIC STUDIES: Oxygen Saturation is 100% on room air, normal by my interpretation.    COORDINATION OF CARE:  16:04- Discussed  planned course of treatment with the patient, including a UA, CBC, and metabolic panel, who is agreeable at this time.  17:15- Medication Orders- sodium chloride 0.9% bolus 500 mL- Once.  18:23 PM- Pt now having diffuse low back pain and appears uncomfortable.  No focal motor deficits in his lower extremities.  Will obtain CT imaging.  Pt agreeable.  18:30- Medication Orders- fentanyl (sublimaze) injection 50 mcg- once.  19:41- Recheck- Discussed admitting to the hospital.  I spoke to dr Jerre Simon who advised admission to hospitalist and he will likely perform cysto while inpatient  7:59 PM D/w dr Orvan Falconer, he will admit.  We discussed labs/imaging (K of 5.7 but no real EKG changes)  Will admit to med-surg   Results for orders placed during the hospital encounter of 12/04/12  BASIC METABOLIC PANEL      Component Value Range   Sodium 135  135 - 145 mEq/L   Potassium 5.7 (*) 3.5 - 5.1 mEq/L   Chloride 105  96 - 112 mEq/L   CO2 23  19 - 32 mEq/L   Glucose, Bld 117 (*) 70 - 99 mg/dL   BUN 36 (*) 6 - 23 mg/dL   Creatinine, Ser 0.86 (*) 0.50 - 1.35 mg/dL   Calcium 8.9  8.4 - 57.8 mg/dL   GFR calc non Af Amer 27 (*) >90 mL/min   GFR calc Af Amer 31 (*) >90 mL/min  CBC WITH DIFFERENTIAL      Component Value Range   WBC 9.9  4.0 - 10.5 K/uL   RBC 3.37 (*) 4.22 - 5.81 MIL/uL   Hemoglobin 10.5 (*) 13.0 - 17.0 g/dL   HCT 46.9 (*) 62.9 - 52.8 %   MCV 95.5  78.0 - 100.0 fL   MCH 31.2  26.0 - 34.0 pg   MCHC 32.6  30.0 - 36.0 g/dL   RDW 41.3  24.4 - 01.0 %   Platelets 416 (*) 150 - 400 K/uL   Neutrophils Relative 70  43 - 77 %   Neutro Abs 6.9  1.7 - 7.7 K/uL   Lymphocytes Relative 14  12 - 46 %   Lymphs Abs 1.4  0.7 - 4.0 K/uL   Monocytes Relative 12  3 - 12 %   Monocytes Absolute 1.2 (*) 0.1 - 1.0 K/uL   Eosinophils Relative 3  0 - 5 %   Eosinophils Absolute 0.3  0.0 -  0.7 K/uL   Basophils Relative 1  0 - 1 %   Basophils Absolute 0.1  0.0 - 0.1 K/uL  LACTIC ACID, PLASMA       Component Value Range   Lactic Acid, Venous 1.3  0.5 - 2.2 mmol/L  URINALYSIS, ROUTINE W REFLEX MICROSCOPIC      Component Value Range   Color, Urine YELLOW  YELLOW   APPearance CLEAR  CLEAR   Specific Gravity, Urine 1.025  1.005 - 1.030   pH 6.0  5.0 - 8.0   Glucose, UA NEGATIVE  NEGATIVE mg/dL   Hgb urine dipstick LARGE (*) NEGATIVE   Bilirubin Urine NEGATIVE  NEGATIVE   Ketones, ur NEGATIVE  NEGATIVE mg/dL   Protein, ur 161 (*) NEGATIVE mg/dL   Urobilinogen, UA 0.2  0.0 - 1.0 mg/dL   Nitrite NEGATIVE  NEGATIVE   Leukocytes, UA NEGATIVE  NEGATIVE  URINE MICROSCOPIC-ADD ON      Component Value Range   Squamous Epithelial / LPF FEW (*) RARE   WBC, UA 11-20  <3 WBC/hpf   RBC / HPF 21-50  <3 RBC/hpf   Bacteria, UA RARE  RARE    Ct Abdomen Pelvis Wo Contrast  12/04/2012  *RADIOLOGY REPORT*  Clinical Data: Left lower quadrant and left flank pain.  Low back pain.  CT ABDOMEN AND PELVIS WITHOUT CONTRAST  Technique:  Multidetector CT imaging of the abdomen and pelvis was performed following the standard protocol without intravenous contrast.  Comparison: 12/11/2004.  Findings: Lung Bases: Small bilateral pleural effusions.  Basilar atelectasis.  Coronary artery atherosclerosis is present.  Severe mitral annular calcification.  Liver:  Unenhanced CT was performed per clinician order.  Lack of IV contrast limits sensitivity and specificity, especially for evaluation of abdominal/pelvic solid viscera.  Old granulomatous calcifications.  Spleen:  Old granulomatous disease.  Gallbladder:  Dependently layering calcified gallstones.  Common bile duct:  Within normal limits for age.  Pancreas:  Stable prominence of the pancreatic duct.  Scattered pancreatic calcifications, likely associated with prior pancreatitis.  Adrenal glands:  Stable thickening of the left adrenal gland, likely representing adenoma.  Kidneys:  Severe left hydronephrosis.  Left hydroureter is present. Vascular calcifications are  present in the kidneys.  No collecting system calculi.  No ureteral calculi.  The left hydroureter extends to the urinary bladder where there is mural thickening on the left side of the trigone, suspicious for an obstructing neoplasm.  Right kidney and ureter appear within normal limits.  Stomach:  Grossly normal.  Decompressed.  Small bowel:  Normal.  Colon:   High density along the cecum compatible with prior appendectomy.  Colonic diverticulosis without diverticulitis. There is thickening of the rectum.  Follow-up colonoscopy recommended.  Pelvic Genitourinary:  High attenuation of the left side of the bladder trigone as described above.  No free fluid.  Prostate gland appears within normal limits.  No inguinal adenopathy.  No pelvic sidewall adenopathy.  Bones:  Lumbar spondylosis with anterolisthesis of L4 on L5.  Old T11 compression fracture.  Vasculature: Severe atherosclerosis without aneurysm.  IMPRESSION: 1.  Severe left hydroureteronephrosis extending to the left UVJ. There is soft tissue attenuation in the left side of the bladder trigone, raising the possibility of neoplasm producing obstruction of the distal left ureter. No ureteral calculi are seen. Cystoscopy recommended. 2.  Thickening of the rectum is nonspecific.  Follow-up endoscopy is recommended. 3.  Small bilateral pleural effusions and basilar atelectasis. 4.  Cholelithiasis without CT evidence of cholecystitis. 5.  Atherosclerosis and coronary artery disease.   Original Report Authenticated By: Andreas Newport, M.D.    Dg Chest 2 View  12/04/2012  *RADIOLOGY REPORT*  Clinical Data: Chronic cough  CHEST - 2 VIEW  Comparison: 05/13/2008  Findings: The heart and pulmonary vascularity are within normal limits.  Some atelectasis is seen in the left lung base. Hyperinflation consistent with COPD is noted.  No acute bony abnormality is seen.  IMPRESSION: Atelectasis versus scarring in the left lung base.   Original Report Authenticated By: Alcide Clever, M.D.    Dg Hip Complete Left  12/04/2012  *RADIOLOGY REPORT*  Clinical Data: Hip pain  LEFT HIP - COMPLETE 2+ VIEW  Comparison: None.  Findings: Four views of the left hip submitted.  No acute fracture or subluxation.  Mild degenerative changes noted bilateral hip joint with marginal acetabular spurring. Mild atherosclerotic calcifications of femoral arteries. Pelvic phleboliths are noted. Mild degenerative changes bilateral inferior aspect SI joints.  IMPRESSION: No acute fracture or subluxation.  Mild degenerative changes bilateral hip joints.   Original Report Authenticated By: Natasha Mead, M.D.         MDM  Nursing notes including past medical history and social history reviewed and considered in documentation Labs/vital reviewed and considered Previous records reviewed and considered - previous labs reviewed xrays reviewed and considered       Date: 12/04/2012  Rate: 68  Rhythm: normal sinus rhythm  QRS Axis: normal  Intervals: normal  ST/T Wave abnormalities: nonspecific ST changes  Conduction Disutrbances:nonspecific intraventricular conduction delay  Narrative Interpretation:   Old EKG Reviewed: unchanged     I personally performed the services described in this documentation, which was scribed in my presence. The recorded information has been reviewed and is accurate.           Joya Gaskins, MD 12/04/12 2000

## 2012-12-04 NOTE — ED Notes (Signed)
Pt in Xray, has not come to room yet.

## 2012-12-04 NOTE — ED Notes (Signed)
Report given to floor, pt ready for transport. 

## 2012-12-04 NOTE — ED Notes (Signed)
Asked patient to collect urine sample. Patient unable to void at this time.

## 2012-12-05 DIAGNOSIS — E039 Hypothyroidism, unspecified: Secondary | ICD-10-CM | POA: Diagnosis present

## 2012-12-05 DIAGNOSIS — E860 Dehydration: Secondary | ICD-10-CM

## 2012-12-05 DIAGNOSIS — I10 Essential (primary) hypertension: Secondary | ICD-10-CM | POA: Diagnosis present

## 2012-12-05 LAB — COMPREHENSIVE METABOLIC PANEL
ALT: 11 U/L (ref 0–53)
Alkaline Phosphatase: 89 U/L (ref 39–117)
CO2: 22 mEq/L (ref 19–32)
GFR calc Af Amer: 30 mL/min — ABNORMAL LOW (ref >90)
GFR calc non Af Amer: 26 mL/min — ABNORMAL LOW (ref >90)
Glucose, Bld: 100 mg/dL — ABNORMAL HIGH (ref 70–99)
Potassium: 5 mEq/L (ref 3.5–5.1)
Sodium: 139 mEq/L (ref 135–145)
Total Bilirubin: 0.4 mg/dL (ref 0.3–1.2)

## 2012-12-05 LAB — CBC
HCT: 30.3 % — ABNORMAL LOW (ref 39.0–52.0)
Hemoglobin: 9.8 g/dL — ABNORMAL LOW (ref 13.0–17.0)
MCH: 31 pg (ref 26.0–34.0)
MCHC: 32.3 g/dL (ref 30.0–36.0)
MCV: 95.9 fL (ref 78.0–100.0)
Platelets: 373 K/uL (ref 150–400)
RBC: 3.16 MIL/uL — ABNORMAL LOW (ref 4.22–5.81)
RDW: 15.3 % (ref 11.5–15.5)
WBC: 13.1 K/uL — ABNORMAL HIGH (ref 4.0–10.5)

## 2012-12-05 LAB — TSH: TSH: 11.614 u[IU]/mL — ABNORMAL HIGH (ref 0.350–4.500)

## 2012-12-05 MED ORDER — SALINE SPRAY 0.65 % NA SOLN
NASAL | Status: AC
  Filled 2012-12-05: qty 44

## 2012-12-05 MED ORDER — SALINE SPRAY 0.65 % NA SOLN
1.0000 | NASAL | Status: DC | PRN
  Administered 2012-12-05: 1 via NASAL
  Filled 2012-12-05: qty 44

## 2012-12-05 NOTE — Progress Notes (Signed)
Subjective: This man was admitted with severe left hydroureter . There is a possibility of a mass in the left side of the bladder. This could be causing the obstruction. He feels somewhat better today , he had pain yesterday in his back.           Physical Exam: Blood pressure 158/86, pulse 80, temperature 97.8 F (36.6 C), temperature source Axillary, resp. rate 20, height 5\' 11"  (1.803 m), weight 73.4 kg (161 lb 13.1 oz), SpO2 94.00%. He looks systemically well. He does not look clinically dehydrated. He is not cachectic. Heart sounds are present and normal without murmurs. Lung fields are clear. Abdomen is soft and nontender. He is alert and oriented.   Investigations:  No results found for this or any previous visit (from the past 240 hour(s)).   Basic Metabolic Panel:  Basename 12/05/12 0440 12/04/12 1613  NA 139 135  K 5.0 5.7*  CL 106 105  CO2 22 23  GLUCOSE 100* 117*  BUN 38* 36*  CREATININE 2.11* 2.04*  CALCIUM 8.8 8.9  MG -- --  PHOS -- --   Liver Function Tests:  [facility] 12/05/12 0440  AST 20  ALT 11  ALKPHOS 89  BILITOT 0.4  PROT 5.7*  ALBUMIN 2.9*     CBC:  Basename 12/05/12 0440 12/04/12 1613  WBC 13.1* 9.9  NEUTROABS -- 6.9  HGB 9.8* 10.5*  HCT 30.3* 32.2*  MCV 95.9 95.5  PLT 373 416*    Ct Abdomen Pelvis Wo Contrast  12/04/2012  *RADIOLOGY REPORT*  Clinical Data: Left lower quadrant and left flank pain.  Low back pain.  CT ABDOMEN AND PELVIS WITHOUT CONTRAST  Technique:  Multidetector CT imaging of the abdomen and pelvis was performed following the standard protocol without intravenous contrast.  Comparison: 12/11/2004.  Findings: Lung Bases: Small bilateral pleural effusions.  Basilar atelectasis.  Coronary artery atherosclerosis is present.  Severe mitral annular calcification.  Liver:  Unenhanced CT was performed per clinician order.  Lack of IV contrast limits sensitivity and specificity, especially for evaluation of  abdominal/pelvic solid viscera.  Old granulomatous calcifications.  Spleen:  Old granulomatous disease.  Gallbladder:  Dependently layering calcified gallstones.  Common bile duct:  Within normal limits for age.  Pancreas:  Stable prominence of the pancreatic duct.  Scattered pancreatic calcifications, likely associated with prior pancreatitis.  Adrenal glands:  Stable thickening of the left adrenal gland, likely representing adenoma.  Kidneys:  Severe left hydronephrosis.  Left hydroureter is present. Vascular calcifications are present in the kidneys.  No collecting system calculi.  No ureteral calculi.  The left hydroureter extends to the urinary bladder where there is mural thickening on the left side of the trigone, suspicious for an obstructing neoplasm.  Right kidney and ureter appear within normal limits.  Stomach:  Grossly normal.  Decompressed.  Small bowel:  Normal.  Colon:   High density along the cecum compatible with prior appendectomy.  Colonic diverticulosis without diverticulitis. There is thickening of the rectum.  Follow-up colonoscopy recommended.  Pelvic Genitourinary:  High attenuation of the left side of the bladder trigone as described above.  No free fluid.  Prostate gland appears within normal limits.  No inguinal adenopathy.  No pelvic sidewall adenopathy.  Bones:  Lumbar spondylosis with anterolisthesis of L4 on L5.  Old T11 compression fracture.  Vasculature: Severe atherosclerosis without aneurysm.  IMPRESSION: 1.  Severe left hydroureteronephrosis extending to the left UVJ. There is soft tissue attenuation in the  left side of the bladder trigone, raising the possibility of neoplasm producing obstruction of the distal left ureter. No ureteral calculi are seen. Cystoscopy recommended. 2.  Thickening of the rectum is nonspecific.  Follow-up endoscopy is recommended. 3.  Small bilateral pleural effusions and basilar atelectasis. 4.  Cholelithiasis without CT evidence of cholecystitis. 5.   Atherosclerosis and coronary artery disease.   Original Report Authenticated By: Andreas Newport, M.D.    Dg Chest 2 View  12/04/2012  *RADIOLOGY REPORT*  Clinical Data: Chronic cough  CHEST - 2 VIEW  Comparison: 05/13/2008  Findings: The heart and pulmonary vascularity are within normal limits.  Some atelectasis is seen in the left lung base. Hyperinflation consistent with COPD is noted.  No acute bony abnormality is seen.  IMPRESSION: Atelectasis versus scarring in the left lung base.   Original Report Authenticated By: Alcide Clever, M.D.    Dg Hip Complete Left  12/04/2012  *RADIOLOGY REPORT*  Clinical Data: Hip pain  LEFT HIP - COMPLETE 2+ VIEW  Comparison: None.  Findings: Four views of the left hip submitted.  No acute fracture or subluxation.  Mild degenerative changes noted bilateral hip joint with marginal acetabular spurring. Mild atherosclerotic calcifications of femoral arteries. Pelvic phleboliths are noted. Mild degenerative changes bilateral inferior aspect SI joints.  IMPRESSION: No acute fracture or subluxation.  Mild degenerative changes bilateral hip joints.   Original Report Authenticated By: Natasha Mead, M.D.       Medications: I have reviewed the patient's current medications.  Impression: 1. Acute renal failure secondary to left hydroureter. 2. Possible left-sided bladder mass, nausea Mark cancer. 3. Hypertension. 4. Hypothyroidism. 5. Anemia, possibly of chronic disease.     Plan: 1. Continue with IV fluids and monitor electrolytes closely. 2. Await urology, Dr. Jerre Simon to see this patient and initiate procedure, presumably cystoscopy.     LOS: 1 day   Wilson Singer Pager [phone]  12/05/2012, 10:38 AM

## 2012-12-05 NOTE — Consult Note (Signed)
Report #[id number]

## 2012-12-05 NOTE — Progress Notes (Signed)
UR chart review completed.  

## 2012-12-06 DIAGNOSIS — N1330 Unspecified hydronephrosis: Secondary | ICD-10-CM

## 2012-12-06 DIAGNOSIS — E039 Hypothyroidism, unspecified: Secondary | ICD-10-CM

## 2012-12-06 LAB — COMPREHENSIVE METABOLIC PANEL
ALT: 10 U/L (ref 0–53)
Albumin: 2.7 g/dL — ABNORMAL LOW (ref 3.5–5.2)
Alkaline Phosphatase: 85 U/L (ref 39–117)
BUN: 32 mg/dL — ABNORMAL HIGH (ref 6–23)
Potassium: 4.7 mEq/L (ref 3.5–5.1)
Sodium: 137 mEq/L (ref 135–145)
Total Protein: 5.6 g/dL — ABNORMAL LOW (ref 6.0–8.3)

## 2012-12-06 LAB — URINE CULTURE: Colony Count: NO GROWTH

## 2012-12-06 LAB — CBC
MCHC: 32.1 g/dL (ref 30.0–36.0)
RDW: 15.5 % (ref 11.5–15.5)

## 2012-12-06 MED ORDER — LEVOTHYROXINE SODIUM 100 MCG PO TABS
100.0000 ug | ORAL_TABLET | Freq: Every day | ORAL | Status: DC
  Administered 2012-12-07 – 2012-12-12 (×6): 100 ug via ORAL
  Filled 2012-12-06 (×6): qty 1

## 2012-12-06 MED ORDER — AMLODIPINE BESYLATE 5 MG PO TABS
5.0000 mg | ORAL_TABLET | Freq: Every day | ORAL | Status: DC
  Administered 2012-12-06: 5 mg via ORAL
  Filled 2012-12-06: qty 1

## 2012-12-06 NOTE — Progress Notes (Signed)
[phone]

## 2012-12-06 NOTE — Progress Notes (Signed)
Subjective: This man was admitted with severe left hydroureter . There is a possibility of a mass in the left side of the bladder. This could be causing the obstruction. He feels somewhat better today , he had pain yesterday in his back. He still continues to have hematuria and his catheter bag.           Physical Exam: Blood pressure 179/79, pulse 84, temperature 97.7 F (36.5 C), temperature source Oral, resp. rate 20, height 5\' 11"  (1.803 m), weight 73.4 kg (161 lb 13.1 oz), SpO2 97.00%. He looks systemically well. He does not look clinically dehydrated. He is not cachectic. Heart sounds are present and normal without murmurs. Lung fields are clear. Abdomen is soft and nontender. He is alert and oriented.   Investigations:     Basic Metabolic Panel:  Basename 12/06/12 0654 12/05/12 0440  NA 137 139  K 4.7 5.0  CL 107 106  CO2 21 22  GLUCOSE 93 100*  BUN 32* 38*  CREATININE 2.13* 2.11*  CALCIUM 8.6 8.8  MG -- --  PHOS -- --   Liver Function Tests:  [facility] 12/06/12 0654 12/05/12 0440  AST 22 20  ALT 10 11  ALKPHOS 85 89  BILITOT 0.5 0.4  PROT 5.6* 5.7*  ALBUMIN 2.7* 2.9*     CBC:  Basename 12/06/12 0654 12/05/12 0440 12/04/12 1613  WBC 11.2* 13.1* --  NEUTROABS -- -- 6.9  HGB 9.4* 9.8* --  HCT 29.3* 30.3* --  MCV 95.8 95.9 --  PLT 336 373 --    Ct Abdomen Pelvis Wo Contrast  12/04/2012  *RADIOLOGY REPORT*  Clinical Data: Left lower quadrant and left flank pain.  Low back pain.  CT ABDOMEN AND PELVIS WITHOUT CONTRAST  Technique:  Multidetector CT imaging of the abdomen and pelvis was performed following the standard protocol without intravenous contrast.  Comparison: 12/11/2004.  Findings: Lung Bases: Small bilateral pleural effusions.  Basilar atelectasis.  Coronary artery atherosclerosis is present.  Severe mitral annular calcification.  Liver:  Unenhanced CT was performed per clinician order.  Lack of IV contrast limits sensitivity and  specificity, especially for evaluation of abdominal/pelvic solid viscera.  Old granulomatous calcifications.  Spleen:  Old granulomatous disease.  Gallbladder:  Dependently layering calcified gallstones.  Common bile duct:  Within normal limits for age.  Pancreas:  Stable prominence of the pancreatic duct.  Scattered pancreatic calcifications, likely associated with prior pancreatitis.  Adrenal glands:  Stable thickening of the left adrenal gland, likely representing adenoma.  Kidneys:  Severe left hydronephrosis.  Left hydroureter is present. Vascular calcifications are present in the kidneys.  No collecting system calculi.  No ureteral calculi.  The left hydroureter extends to the urinary bladder where there is mural thickening on the left side of the trigone, suspicious for an obstructing neoplasm.  Right kidney and ureter appear within normal limits.  Stomach:  Grossly normal.  Decompressed.  Small bowel:  Normal.  Colon:   High density along the cecum compatible with prior appendectomy.  Colonic diverticulosis without diverticulitis. There is thickening of the rectum.  Follow-up colonoscopy recommended.  Pelvic Genitourinary:  High attenuation of the left side of the bladder trigone as described above.  No free fluid.  Prostate gland appears within normal limits.  No inguinal adenopathy.  No pelvic sidewall adenopathy.  Bones:  Lumbar spondylosis with anterolisthesis of L4 on L5.  Old T11 compression fracture.  Vasculature: Severe atherosclerosis without aneurysm.  IMPRESSION: 1.  Severe left  hydroureteronephrosis extending to the left UVJ. There is soft tissue attenuation in the left side of the bladder trigone, raising the possibility of neoplasm producing obstruction of the distal left ureter. No ureteral calculi are seen. Cystoscopy recommended. 2.  Thickening of the rectum is nonspecific.  Follow-up endoscopy is recommended. 3.  Small bilateral pleural effusions and basilar atelectasis. 4.  Cholelithiasis  without CT evidence of cholecystitis. 5.  Atherosclerosis and coronary artery disease.   Original Report Authenticated By: Andreas Newport, M.D.    Dg Chest 2 View  12/04/2012  *RADIOLOGY REPORT*  Clinical Data: Chronic cough  CHEST - 2 VIEW  Comparison: 05/13/2008  Findings: The heart and pulmonary vascularity are within normal limits.  Some atelectasis is seen in the left lung base. Hyperinflation consistent with COPD is noted.  No acute bony abnormality is seen.  IMPRESSION: Atelectasis versus scarring in the left lung base.   Original Report Authenticated By: Alcide Clever, M.D.    Dg Hip Complete Left  12/04/2012  *RADIOLOGY REPORT*  Clinical Data: Hip pain  LEFT HIP - COMPLETE 2+ VIEW  Comparison: None.  Findings: Four views of the left hip submitted.  No acute fracture or subluxation.  Mild degenerative changes noted bilateral hip joint with marginal acetabular spurring. Mild atherosclerotic calcifications of femoral arteries. Pelvic phleboliths are noted. Mild degenerative changes bilateral inferior aspect SI joints.  IMPRESSION: No acute fracture or subluxation.  Mild degenerative changes bilateral hip joints.   Original Report Authenticated By: Natasha Mead, M.D.       Medications: I have reviewed the patient's current medications.  Impression: 1. Acute renal failure secondary to left hydroureter. 2. Possible left-sided bladder mass, nausea Mark cancer. 3. Hypertension, uncontrolled. 4. Hypothyroidism, undertreated. Patient tells me he is compliant with his medications. 5. Anemia, possibly of chronic disease, exacerbated by hematuria.     Plan: 1. Reduce IV fluids. Monitor renal function closely. 2. Start amlodipine, 5 mg daily for his uncontrolled hypertension. 3. Increase Synthroid  to 100 mcg daily. 4. Cystoscopy by Dr. Jerre Simon on Monday.     LOS: 2 days   Wilson Singer Pager [phone]  12/06/2012, 10:45 AM

## 2012-12-06 NOTE — Consult Note (Signed)
NAMELEVON, BOETTCHER NO.:  [id number]  MEDICAL RECORD NO.:  [id number]  LOCATION:  A331                          FACILITY:  [facility]  PHYSICIAN:  Ky Barban, M.D.DATE OF BIRTH:  1919/10/06  DATE OF CONSULTATION: DATE OF DISCHARGE:                                CONSULTATION   CHIEF COMPLAINT:  Urinary retention.  HISTORY OF PRESENT ILLNESS:  A 79 year old gentleman who is very poor historian.  He says he is having some of the lower backache, constipation for the last few weeks, found to be in urinary retention. CT scan showed there is a questionable bladder tumor which is blocking his left ureter in the ureterovesical junction area on the left side. He said he denies any voiding difficulty or having any gross hematuria, but he has a Foley catheter.  I can see there was blood in it.  His wife was there also and never had any gross hematuria, fever, chills, or any voiding difficulty.  He wants to go home.  PHYSICAL EXAMINATION:  GENERAL:  Moderately built male, who is sitting in the bed comfortably not in acute distress. CENTRAL NERVOUS SYSTEM:  No gross neurological deficit. HEENT:  Head, neck, eye, ENT, negative. CHEST:  Symmetrical.  Normal breath sounds. HEART:  Regular sinus rhythm.  No murmur. ABDOMEN:  Soft and flat.  Liver, spleen, and kidneys not palpable. GENITOURINARY:  External genitalia is circumcised.  Meatus is adequate. Testicles are normal.  Has Foley catheter in place draining red colored urine.  PAST SURGICAL HISTORY:  Include hernia repair, cataract extraction, appendectomy, and eye surgery.  OTHER MEDICAL PROBLEMS:  Include hypertension, arthritis, stroke, and a history of having malaria.  IMPRESSION: 1. Urinary and gross hematuria. 2. Left hydronephrosis, questionable bladder tumor, removed.  PLAN:  Cystoscopy under local anesthesia on Monday.  I explained this to the patient.  He understands, he wants to go home but I has not  stay still I do cystoscopy because it is possible he may have to do a bladder tumor resection after I did a cystoscopy.  It was not going to happen on the same day.  I told them I am just going to do a cystoscopy.  His lab workup, his blood pressure is 170/48, temperature is normal at 98.1.  Labs, sodium 135, potassium 5.7, chloride 105, CO2 is 23, glucose 117, BUN is 36, creatinine 2.04.  So, he is in renal failure also, and so hopefully the Foley catheter will help.  We will check his BUN and creatinine again tomorrow.  We will schedule him for cystoscopy under local anesthesia at 1 o'clock, Monday.     Ky Barban, M.D.     MIJ/MEDQ  D:  12/05/2012  T:  12/06/2012  Job:  [id number]

## 2012-12-07 ENCOUNTER — Inpatient Hospital Stay (HOSPITAL_COMMUNITY): Payer: Medicare Other

## 2012-12-07 DIAGNOSIS — R0602 Shortness of breath: Secondary | ICD-10-CM | POA: Diagnosis not present

## 2012-12-07 DIAGNOSIS — J9811 Atelectasis: Secondary | ICD-10-CM | POA: Diagnosis not present

## 2012-12-07 LAB — BASIC METABOLIC PANEL
CO2: 19 mEq/L (ref 19–32)
Calcium: 8.2 mg/dL — ABNORMAL LOW (ref 8.4–10.5)
Glucose, Bld: 98 mg/dL (ref 70–99)
Sodium: 140 mEq/L (ref 135–145)

## 2012-12-07 LAB — CBC
HCT: 31.2 % — ABNORMAL LOW (ref 39.0–52.0)
Hemoglobin: 10.2 g/dL — ABNORMAL LOW (ref 13.0–17.0)
MCH: 31.4 pg (ref 26.0–34.0)
MCV: 96 fL (ref 78.0–100.0)
Platelets: 353 10*3/uL (ref 150–400)
RBC: 3.25 MIL/uL — ABNORMAL LOW (ref 4.22–5.81)

## 2012-12-07 MED ORDER — ALBUTEROL (5 MG/ML) CONTINUOUS INHALATION SOLN
2.5000 mg/h | INHALATION_SOLUTION | Freq: Four times a day (QID) | RESPIRATORY_TRACT | Status: DC
  Administered 2012-12-07 – 2012-12-08 (×2): 2.5 mg/h via RESPIRATORY_TRACT
  Filled 2012-12-07: qty 20

## 2012-12-07 MED ORDER — AMLODIPINE BESYLATE 5 MG PO TABS
10.0000 mg | ORAL_TABLET | Freq: Every day | ORAL | Status: DC
  Administered 2012-12-07 – 2012-12-12 (×6): 10 mg via ORAL
  Filled 2012-12-07 (×6): qty 2

## 2012-12-07 MED ORDER — HYDROMORPHONE HCL PF 1 MG/ML IJ SOLN: 0.5000 mg | INTRAMUSCULAR | Status: DC | PRN

## 2012-12-07 MED ORDER — ALBUTEROL SULFATE (5 MG/ML) 0.5% IN NEBU
2.5000 mg | INHALATION_SOLUTION | RESPIRATORY_TRACT | Status: DC | PRN
  Administered 2012-12-07: 2.5 mg via RESPIRATORY_TRACT
  Filled 2012-12-07 (×3): qty 0.5

## 2012-12-07 MED ORDER — CLONIDINE HCL 0.1 MG PO TABS
0.1000 mg | ORAL_TABLET | Freq: Two times a day (BID) | ORAL | Status: DC
  Administered 2012-12-07 – 2012-12-12 (×11): 0.1 mg via ORAL
  Filled 2012-12-07 (×12): qty 1

## 2012-12-07 MED ORDER — LEVOFLOXACIN IN D5W 750 MG/150ML IV SOLN
750.0000 mg | INTRAVENOUS | Status: DC
  Administered 2012-12-07 – 2012-12-09 (×2): 750 mg via INTRAVENOUS
  Filled 2012-12-07 (×3): qty 150

## 2012-12-07 MED ORDER — LEVOFLOXACIN IN D5W 750 MG/150ML IV SOLN
INTRAVENOUS | Status: AC
  Filled 2012-12-07: qty 150

## 2012-12-07 MED ORDER — ALBUTEROL SULFATE (5 MG/ML) 0.5% IN NEBU
INHALATION_SOLUTION | RESPIRATORY_TRACT | Status: AC
  Filled 2012-12-07: qty 0.5

## 2012-12-07 NOTE — Progress Notes (Signed)
NAME:  Reginald Waller, Reginald Waller NO.:  [id number]  MEDICAL RECORD NO.:  [id number]  LOCATION:  A331                          FACILITY:  [facility]  PHYSICIAN:  Ky Barban, M.D.DATE OF BIRTH:  [birth year]  DATE OF PROCEDURE: DATE OF DISCHARGE:                                PROGRESS NOTE   [name] generally feels okay.  He is afebrile.  Urine is still showing blood in it.  His creatinine came down little bit.  It was 2.13, now it is 2.11.  His CT scan on admission showed left hydronephrosis, hydroureter.  There is a questionable mass in the bladder.  It is very suspicious for bladder cancer, and I have scheduled him for cystoscopy under local on Monday.  His urine culture is showing no growth.  Chest x- ray shows questionable atelectasis of the left lower lobe.  On schedule for cysto on Monday.     Ky Barban, M.D.     MIJ/MEDQ  D:  12/06/2012  T:  12/07/2012  Job:  [id number]

## 2012-12-07 NOTE — Progress Notes (Signed)
D- Pt's BP is elevated at 195/69 with HR of 91.  Pt denies symptoms of elevated BP.  Upon going in to assess patient after NT told me about his BP he was noted to have generic nasal spray containing Oxymetazoline at the bedside.  I asked the patient if he had been using the nasal spray and he said yes I use it all the time.  I asked him how he got it here in the hospital and he said his wife brought it to him because the saline nose spray we gave him wasn't working.  A-Dr. Orvan Falconer notified of the above and he instructed me to asked the patient not to use the nasal spray because it raises his BP.  I spoke with patient about not using the nasal spray and he stated he would not use it but would not let me remove it from the bedside.  I attempted to educate the patient on the over use of nasal sprays and he was not receptive to the information. He just reiterated he uses them all the time to unstop his nose.    R- Patient in bed and denies complaints at this time.  Dr. Orvan Falconer placed new orders in chart to be carried out.  Nursing staff to continue to monitor.

## 2012-12-07 NOTE — Progress Notes (Signed)
bp 150/75 afebrile urine still blooody for cysto am.

## 2012-12-07 NOTE — Progress Notes (Signed)
Subjective: To walk into the room, the patient was coughing. He complained of chest congestion and shortness of breath. He denies being choked on food on history. He denies chest pain.  Objective: Vital signs in last 24 hours: Filed Vitals:   12/07/12 0500 12/07/12 1000 12/07/12 1146 12/07/12 1342  BP: 195/69 203/90 198/77   Pulse: 92 96    Temp: 97.9 F (36.6 C) 98.1 F (36.7 C)    TempSrc: Oral     Resp: 16 18    Height:      Weight: 80.7 kg (177 lb 14.6 oz)     SpO2: 93% 90%  94%    Intake/Output Summary (Last 24 hours) at 12/07/12 1411 Last data filed at 12/07/12 0857  Gross per 24 hour  Intake   2489 ml  Output   1050 ml  Net   1439 ml    Weight change:   Physical exam: General: Alert 79 year old Caucasian man coughing. Lungs: Faint wheezes. Breathing nonlabored at rest but mildly labored when talking. Heart: S1, S2, with borderline tachycardia. Abdomen: Mildly obese, positive bowel sounds, mildly tender over the bladder and left flight. No rebound or rigidity. GU: Dark Coca[employer]a colored urine in the Foley bag. Extremities: No pedal edema.  Lab Results: Basic Metabolic Panel:  Basename 12/07/12 0643 12/06/12 0654  NA 140 137  K 4.6 4.7  CL 111 107  CO2 19 21  GLUCOSE 98 93  BUN 31* 32*  CREATININE 2.03* 2.13*  CALCIUM 8.2* 8.6  MG -- --  PHOS -- --   Liver Function Tests:  Basename 12/06/12 0654 12/05/12 0440  AST 22 20  ALT 10 11  ALKPHOS 85 89  BILITOT 0.5 0.4  PROT 5.6* 5.7*  ALBUMIN 2.7* 2.9*   No results found for this basename: LIPASE:2,AMYLASE:2 in the last 72 hours No results found for this basename: AMMONIA:2 in the last 72 hours CBC:  Basename 12/07/12 0643 12/06/12 0654 12/04/12 1613  WBC 12.9* 11.2* --  NEUTROABS -- -- 6.9  HGB 10.2* 9.4* --  HCT 31.2* 29.3* --  MCV 96.0 95.8 --  PLT 353 336 --   Cardiac Enzymes: No results found for this basename: CKTOTAL:3,CKMB:3,CKMBINDEX:3,TROPONINI:3 in the last 72 hours BNP: No  results found for this basename: PROBNP:3 in the last 72 hours D-Dimer: No results found for this basename: DDIMER:2 in the last 72 hours CBG: No results found for this basename: GLUCAP:6 in the last 72 hours Hemoglobin A1C: No results found for this basename: HGBA1C in the last 72 hours Fasting Lipid Panel: No results found for this basename: CHOL,HDL,LDLCALC,TRIG,CHOLHDL,LDLDIRECT in the last 72 hours Thyroid Function Tests:  [facility] 12/04/12 2214  TSH 11.614*  T4TOTAL --  FREET4 --  T3FREE --  THYROIDAB --   Anemia Panel: No results found for this basename: VITAMINB12,FOLATE,FERRITIN,TIBC,IRON,RETICCTPCT in the last 72 hours Coagulation:  Basename 12/04/12 2214  LABPROT 14.5  INR 1.15   Urine Drug Screen: Drugs of Abuse  No results found for this basename: labopia, cocainscrnur, labbenz, amphetmu, thcu, labbarb    Alcohol Level: No results found for this basename: ETH:2 in the last 72 hours Urinalysis:  Basename 12/04/12 1900  COLORURINE YELLOW  LABSPEC 1.025  PHURINE 6.0  GLUCOSEU NEGATIVE  HGBUR LARGE*  BILIRUBINUR NEGATIVE  KETONESUR NEGATIVE  PROTEINUR 100*  UROBILINOGEN 0.2  NITRITE NEGATIVE  LEUKOCYTESUR NEGATIVE   Misc. Labs:   Micro: Recent Results (from the past 240 hour(s))  URINE CULTURE     Status: Normal  Collection Time   12/04/12  7:59 PM      Component Value Range Status Comment   Specimen Description URINE, CLEAN CATCH   Final    Special Requests NONE   Final    Culture  Setup Time 12/05/2012 06:55   Final    Colony Count NO GROWTH   Final    Culture NO GROWTH   Final    Report Status 12/06/2012 FINAL   Final     Studies/Results: No results found.  Medications:  Scheduled:   . albuterol      . albuterol  2.5 mg/hr Nebulization QID  . amLODipine  10 mg Oral Daily  . cefTRIAXone (ROCEPHIN)  IV  1 g Intravenous Q24H  . cloNIDine  0.1 mg Oral BID  . latanoprost  1 drop Both Eyes QHS  . levothyroxine  100 mcg Oral QAC  breakfast  . loratadine  10 mg Oral Daily  . polyethylene glycol  17 g Oral Daily  . simvastatin  20 mg Oral q1800  . sodium chloride  3 mL Intravenous Q12H  . terazosin  1 mg Oral QHS   Continuous:   . sodium chloride 75 mL/hr at 12/07/12 1610   RUE:[id number], albuterol, hydrALAZINE, HYDROmorphone (DILAUDID) injection, ondansetron (ZOFRAN) IV, sodium chloride, sorbitol, traZODone  Assessment: Active Problems:  Hydronephrosis  Hyperkalemia  UTI (lower urinary tract infection)  Bladder mass  Acute renal failure  Hypertension  Hypothyroidism  SOB (shortness of breath)   Shortness of breath with mild bronchospasms. Will order a chest x-ray and give him an albuterol nebulizer treatment now. Query if the patient had mild aspiration as he had just finished eating lunch.  Left hydronephrosis. Dr. Jerre Simon is following. Cystoscopy is planned for tomorrow.  Possible bladder mass. Per Dr. Jerre Simon.  Urinary tract infection. We'll continue ceftriaxone. Urine culture negative thus far.  Malignant hypertension. This may be exacerbated by Afrin nasal spray. Saline has been ordered instead. Amlodipine was started. I have also started clonidine.  Acute renal failure. His baseline creatinine from 4 to half to 5 years ago was within normal limits, but his recent baseline creatinine is unknown. His renal failure is presumed to be from obstructive uropathy.  Hyperkalemia. Resolved.  Hypothyroidism. Query noncompliance. Synthroid dosing has been increased from 75 mcg to 100 mcg daily.  Anemia associated with acute blood loss. No indication for transfusion now.    Plan: 1. We'll order a chest x-ray and start when necessary albuterol nebulizer. 2. Clonidine at 0.1 mg twice a day order to treat malignant hypertension. 3. Had free T4 to the labs. 4. Cystoscopy planned for tomorrow.     LOS: 3 days   Desman Polak 12/07/2012, 2:11 PM

## 2012-12-07 NOTE — Progress Notes (Signed)
ANTIBIOTIC CONSULT NOTE - INITIAL  Pharmacy Consult for Levaquin Indication: pneumonia  Allergies  Allergen Reactions  . Shellfish Allergy Other (See Comments)    Swelling of throat and tongue.    Patient Measurements: Height: 5\' 11"  (180.3 cm) Weight: 177 lb 14.6 oz (80.7 kg) IBW/kg (Calculated) : 75.3   Vital Signs: Temp: 98.2 F (36.8 C) (01/12 1400) Temp src: Oral (01/12 0500) BP: 152/72 mmHg (01/12 1400) Pulse Rate: 98  (01/12 1400) Intake/Output from previous day: 01/11 0701 - 01/12 0700 In: 2689 [P.O.:600; I.V.:2039; IV Piggyback:50] Out: 1050 [Urine:1050] Intake/Output from this shift: Total I/O In: 480 [P.O.:480] Out: -   Labs:  Basename 12/07/12 0643 12/06/12 0654 12/05/12 0440  WBC 12.9* 11.2* 13.1*  HGB 10.2* 9.4* 9.8*  PLT 353 336 373  LABCREA -- -- --  CREATININE 2.03* 2.13* 2.11*   Estimated Creatinine Clearance: 25.2 ml/min (by C-G formula based on Cr of 2.03). No results found for this basename: VANCOTROUGH:2,VANCOPEAK:2,VANCORANDOM:2,GENTTROUGH:2,GENTPEAK:2,GENTRANDOM:2,TOBRATROUGH:2,TOBRAPEAK:2,TOBRARND:2,AMIKACINPEAK:2,AMIKACINTROU:2,AMIKACIN:2, in the last 72 hours   Microbiology: Recent Results (from the past 720 hour(s))  URINE CULTURE     Status: Normal   Collection Time   12/04/12  7:59 PM      Component Value Range Status Comment   Specimen Description URINE, CLEAN CATCH   Final    Special Requests NONE   Final    Culture  Setup Time 12/05/2012 06:55   Final    Colony Count NO GROWTH   Final    Culture NO GROWTH   Final    Report Status 12/06/2012 FINAL   Final     Medical History: Past Medical History  Diagnosis Date  . Hypertension   . Arthritis   . Stroke   . Malaria     Medications:  Scheduled:    . albuterol      . albuterol  2.5 mg/hr Nebulization QID  . amLODipine  10 mg Oral Daily  . cefTRIAXone (ROCEPHIN)  IV  1 g Intravenous Q24H  . cloNIDine  0.1 mg Oral BID  . latanoprost  1 drop Both Eyes QHS  .  levofloxacin (LEVAQUIN) IV  750 mg Intravenous Q48H  . levothyroxine  100 mcg Oral QAC breakfast  . loratadine  10 mg Oral Daily  . polyethylene glycol  17 g Oral Daily  . simvastatin  20 mg Oral q1800  . sodium chloride  3 mL Intravenous Q12H  . terazosin  1 mg Oral QHS  . [DISCONTINUED] amLODipine  5 mg Oral Daily   Assessment: 79 yo M admitted with PNA. He is also on Rocephin for UTI.  ARF noted.   Goal of Therapy:  Eradicate infection.  Plan:  1) Levaquin 750mg  IV Q48h 2) Monitor renal function and cx data  3) Consider d/c Rocephin  Elson Clan 12/07/2012,4:49 PM

## 2012-12-08 ENCOUNTER — Encounter (HOSPITAL_COMMUNITY)
Admission: EM | Disposition: A | Discharge status: Home Health | Payer: Self-pay | Source: Home / Self Care | Attending: Internal Medicine

## 2012-12-08 ENCOUNTER — Encounter (HOSPITAL_COMMUNITY): Payer: Self-pay | Admitting: *Deleted

## 2012-12-08 DIAGNOSIS — R0602 Shortness of breath: Secondary | ICD-10-CM

## 2012-12-08 DIAGNOSIS — J189 Pneumonia, unspecified organism: Secondary | ICD-10-CM | POA: Diagnosis present

## 2012-12-08 DIAGNOSIS — J90 Pleural effusion, not elsewhere classified: Secondary | ICD-10-CM | POA: Diagnosis present

## 2012-12-08 HISTORY — PX: CYSTOSCOPY: SHX5120

## 2012-12-08 LAB — PRO B NATRIURETIC PEPTIDE: Pro B Natriuretic peptide (BNP): 16208 pg/mL — ABNORMAL HIGH (ref 0–450)

## 2012-12-08 LAB — MRSA PCR SCREENING: MRSA by PCR: NEGATIVE

## 2012-12-08 LAB — CBC
Hemoglobin: 9 g/dL — ABNORMAL LOW (ref 13.0–17.0)
MCH: 30.5 pg (ref 26.0–34.0)
MCHC: 31.8 g/dL (ref 30.0–36.0)
Platelets: 318 10*3/uL (ref 150–400)

## 2012-12-08 LAB — BASIC METABOLIC PANEL
BUN: 32 mg/dL — ABNORMAL HIGH (ref 6–23)
Calcium: 8.6 mg/dL (ref 8.4–10.5)
GFR calc Af Amer: 27 mL/min — ABNORMAL LOW (ref >90)
GFR calc non Af Amer: 24 mL/min — ABNORMAL LOW (ref >90)
Potassium: 4.5 mEq/L (ref 3.5–5.1)
Sodium: 140 mEq/L (ref 135–145)

## 2012-12-08 SURGERY — CYSTOSCOPY, FLEXIBLE
Anesthesia: LOCAL | Site: Bladder | Wound class: Clean Contaminated

## 2012-12-08 MED ORDER — ALBUTEROL SULFATE (5 MG/ML) 0.5% IN NEBU
2.5000 mg | INHALATION_SOLUTION | Freq: Four times a day (QID) | RESPIRATORY_TRACT | Status: DC
  Administered 2012-12-08 – 2012-12-12 (×15): 2.5 mg via RESPIRATORY_TRACT
  Filled 2012-12-08 (×18): qty 0.5

## 2012-12-08 MED ORDER — ALBUTEROL SULFATE (5 MG/ML) 0.5% IN NEBU
INHALATION_SOLUTION | RESPIRATORY_TRACT | Status: AC
  Administered 2012-12-08: 2.5 mg via RESPIRATORY_TRACT
  Filled 2012-12-08: qty 0.5

## 2012-12-08 MED ORDER — STERILE WATER FOR IRRIGATION IR SOLN
Status: DC | PRN
  Administered 2012-12-08: 500 mL

## 2012-12-08 MED ORDER — LIDOCAINE HCL 2 % EX GEL
CUTANEOUS | Status: DC | PRN
  Administered 2012-12-08: 1

## 2012-12-08 MED ORDER — SODIUM CHLORIDE 0.9 % IR SOLN
Status: DC | PRN
  Administered 2012-12-08: 3000 mL

## 2012-12-08 MED ORDER — SODIUM CHLORIDE 0.45 % IV SOLN
INTRAVENOUS | Status: DC
  Administered 2012-12-08 – 2012-12-09 (×2): via INTRAVENOUS
  Filled 2012-12-08 (×3): qty 1000

## 2012-12-08 MED ORDER — HYDRALAZINE HCL 20 MG/ML IJ SOLN: 5.0000 mg | Freq: Four times a day (QID) | INTRAMUSCULAR | Status: DC | PRN

## 2012-12-08 MED ORDER — FUROSEMIDE 10 MG/ML IJ SOLN
20.0000 mg | Freq: Two times a day (BID) | INTRAMUSCULAR | Status: DC
  Administered 2012-12-08 – 2012-12-09 (×2): 20 mg via INTRAVENOUS
  Filled 2012-12-08 (×2): qty 2

## 2012-12-08 SURGICAL SUPPLY — 15 items
CLOTH BEACON ORANGE TIMEOUT ST (SAFETY) ×2 IMPLANT
DRAPE LAPAROTOMY TRNSV 102X78 (DRAPE) ×2 IMPLANT
DRAPE PROXIMA HALF (DRAPES) ×2 IMPLANT
GLOVE BIO SURGEON STRL SZ7 (GLOVE) ×2 IMPLANT
GOWN STRL REIN XL XLG (GOWN DISPOSABLE) ×4 IMPLANT
IV NS IRRIG 3000ML ARTHROMATIC (IV SOLUTION) ×2 IMPLANT
MARKER SKIN DUAL TIP RULER LAB (MISCELLANEOUS) ×2 IMPLANT
SET IRRIG Y TYPE TUR BLADDER L (SET/KITS/TRAYS/PACK) ×2 IMPLANT
SPONGE GAUZE 4X4 12PLY (GAUZE/BANDAGES/DRESSINGS) ×4 IMPLANT
SYRINGE 10CC LL (SYRINGE) ×2 IMPLANT
TOWEL OR 17X26 4PK STRL BLUE (TOWEL DISPOSABLE) ×2 IMPLANT
TRAY FOLEY CATH 14FR (SET/KITS/TRAYS/PACK) ×2 IMPLANT
VALVE DISPOSABLE (MISCELLANEOUS) ×2 IMPLANT
WATER STERILE IRR 1000ML POUR (IV SOLUTION) ×2 IMPLANT
YANKAUER SUCT BULB TIP 10FT TU (MISCELLANEOUS) ×2 IMPLANT

## 2012-12-08 NOTE — Evaluation (Signed)
Occupational Therapy Evaluation Patient Details Name: Reginald Waller MRN: [id number] DOB: 1919-02-14 Today's Date: 12/08/2012 Time: 4098-1191 OT Time Calculation (min): 15 min  OT Assessment / Plan / Recommendation Clinical Impression  Patient is a 79 y/o male s/p Hydronecephrosis presenting to acute OT with deficits below. Patient will benefit from OT services to increase ADL performance, functional transfers, and BUE strength and endurance. Recommend Home Health OT at D/C.    OT Assessment  Patient needs continued OT Services    Follow Up Recommendations  Home health OT       Equipment Recommendations  Other (comment) (TBD)       Frequency  Min 2X/week    Precautions / Restrictions Precautions Precautions: Fall   Pertinent Vitals/Pain No complaints    ADL  Lower Body Dressing: Performed;Minimal assistance Where Assessed - Lower Body Dressing: Supported sit to stand Toilet Transfer: Performed;Minimal assistance Toilet Transfer Method: Surveyor, minerals:  (to recliner) Transfers/Ambulation Related to ADLs: Patient transfers at a Min assist level.    OT Diagnosis: Generalized weakness  OT Problem List: Decreased strength;Impaired balance (sitting and/or standing);Decreased activity tolerance;Decreased knowledge of use of DME or AE OT Treatment Interventions: Self-care/ADL training;DME and/or AE instruction;Therapeutic exercise;Energy conservation;Therapeutic activities;Balance training;Patient/family education   OT Goals Acute Rehab OT Goals OT Goal Formulation: With patient Time For Goal Achievement: 12/15/12 Potential to Achieve Goals: Good ADL Goals Pt Will Perform Grooming: with set-up;Standing at sink ADL Goal: Grooming - Progress: Goal set today Pt Will Perform Upper Body Bathing: with set-up;Sitting, edge of bed ADL Goal: Upper Body Bathing - Progress: Goal set today Pt Will Perform Lower Body Bathing: with supervision;Sit to stand from  bed ADL Goal: Lower Body Bathing - Progress: Goal set today Pt Will Perform Upper Body Dressing: with set-up;Sitting, bed ADL Goal: Upper Body Dressing - Progress: Goal set today Pt Will Perform Lower Body Dressing: with supervision;Sit to stand from bed ADL Goal: Lower Body Dressing - Progress: Goal set today Pt Will Transfer to Toilet: with supervision;Stand pivot transfer;with DME;Regular height toilet ADL Goal: Toilet Transfer - Progress: Goal set today Pt Will Perform Toileting - Clothing Manipulation: with supervision;Sitting on 3-in-1 or toilet ADL Goal: Toileting - Clothing Manipulation - Progress: Goal set today Pt Will Perform Toileting - Hygiene: with supervision;Leaning right and/or left on 3-in-1/toilet;Sit to stand from 3-in-1/toilet ADL Goal: Toileting - Hygiene - Progress: Goal set today Arm Goals Pt Will Complete Theraband Exer: with supervision, verbal cues required/provided;to increase strength;Bilateral upper extremities;1 set;15 reps;Level 1 Theraband Arm Goal: Theraband Exercises - Progress: Goal set today  Visit Information  Last OT Received On: 12/08/12 Assistance Needed: +1    Subjective Data  Subjective: "I need help to get up and go to the bathroom." Patient Stated Goal: To go home.   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 MMT:  4-/5 Left Upper Extremity Assessment LUE ROM/Strength/Tone: Deficits LUE ROM/Strength/Tone Deficits: A/ROM WFL in all ranges. MMT: 4-/5. Left arm swollen from IV placement? IV is now in right arm.     Mobility Bed Mobility Bed Mobility: Supine to Sit;Sitting - Scoot to Edge of Bed Supine to Sit: 5: Supervision;HOB elevated Sitting - Scoot to Edge of Bed: 5: Supervision;With rail Transfers Transfers: Sit to Stand;Stand to Sit Sit to Stand: 4: Min assist;With upper extremity assist;From bed Stand to Sit: 4: Min assist;With upper extremity assist;To chair/3-in-1              End of Session OT - End of Session Activity Tolerance: Patient tolerated treatment well Patient left: in chair;with call bell/phone within reach;with family/visitor present    Limmie Patricia, OTR/L 12/08/2012, 12:17 PM

## 2012-12-08 NOTE — Progress Notes (Signed)
Patient observed with swelling, redness, and weeping from upper left arm. PA [employer] on floor, notified. New orders for antibiotic ointment and wrap in kerlix.

## 2012-12-08 NOTE — OR Nursing (Signed)
Patient received to OR via stretcher at 1351. Skin warm and dry. Alert,oriented x 4 and cooperative. O2 via cannula at 2L/min.  BP 152/71  Heart rate 89 and regular.  Respirations regular and rhythmic 18/min. O2 sat=96%.  States readiness for procedure.  Tolerated procedure well. Post procedure vital signs BP 172/84 Heart rate 98 and regular.  Respirations 20 and regular.  O2 sat =97%.   DDallasRN

## 2012-12-08 NOTE — Brief Op Note (Signed)
12/04/2012 - 12/08/2012  2:36 PM  PATIENT:  Lunette Stands  79 y.o. male  PRE-OPERATIVE DIAGNOSIS:  Hematuria; left hydronephrosis; questionable bladder tumor  POST-OPERATIVE DIAGNOSIS:  * No post-op diagnosis entered *  PROCEDURE:  Procedure(s) (LRB) with comments: CYSTOSCOPY FLEXIBLE (N/A)  SURGEON:  Surgeon(s) and Role:    * Ky Barban, MD - Primary  PHYSICIAN ASSISTANT:   ASSISTANTS: none   ANESTHESIA:   local  EBL:     BLOOD ADMINISTERED:none  DRAINS: Urinary Catheter (Foley)   LOCAL MEDICATIONS USED:  NONE  SPECIMEN:  No Specimen  DISPOSITION OF SPECIMEN:  N/A  COUNTS:  YES  TOURNIQUET:  * No tourniquets in log *  DICTATION: .Other Dictation: Dictation Number dictation#[id number]  PLAN OF CARE: Admit to inpatient   PATIENT DISPOSITION:  PACU - hemodynamically stable.   Delay start of Pharmacological VTE agent (>24hrs) due to surgical blood loss or risk of bleeding:

## 2012-12-08 NOTE — Progress Notes (Signed)
*  PRELIMINARY RESULTS* Echocardiogram 2D Echocardiogram has been performed.  Reginald Waller 12/08/2012, 4:36 PM

## 2012-12-08 NOTE — Progress Notes (Signed)
Subjective: Complaining of left arm pain.  His wife complains that his abdomen appears distended.   Currently, this afternoon, he has no complaints of left arm pain. He is status post cystoscopy. He says that he is hungry. He denies difficulty breathing today.  Objective: Vital signs in last 24 hours: Filed Vitals:   12/08/12 0500 12/08/12 0653 12/08/12 1144 12/08/12 1248  BP: 146/78   161/64  Pulse: 83   93  Temp: 97.6 F (36.4 C)   98 F (36.7 C)  TempSrc: Oral     Resp: 18   18  Height:      Weight: 79.2 kg (174 lb 9.7 oz)     SpO2: 100% 96% 95% 97%    Intake/Output Summary (Last 24 hours) at 12/08/12 1416 Last data filed at 12/08/12 0800  Gross per 24 hour  Intake   2129 ml  Output    750 ml  Net   1379 ml    Weight change: -1.5 kg (-3 lb 4.9 oz)  Physical exam: General: Awake and alert, Smiling, appears comfortable, wife at bedside Lungs: Clear to auscultation, no crackles, no accessory muscle movement. He has fine wheezes. Heart: S1, S2, regular rate and rhythm, no obvious murmurs rubs or gallops Abdomen: Slightly distended, active bowel sounds, nontender, no obvious masses GU: Maroon colored urine in Foley bag Extremities: No pedal edema. Able to move all 4 extremities Skin: Left forearm is erythematous with mild swelling and some weeping of the skin.   Lab Results: Basic Metabolic Panel:  Basename 12/08/12 0534 12/07/12 0643  NA 140 140  K 4.5 4.6  CL 111 111  CO2 20 19  GLUCOSE 92 98  BUN 32* 31*  CREATININE 2.26* 2.03*  CALCIUM 8.6 8.2*  MG -- --  PHOS -- --   Liver Function Tests:  [facility] 12/06/12 0654  AST 22  ALT 10  ALKPHOS 85  BILITOT 0.5  PROT 5.6*  ALBUMIN 2.7*    CBC:  Basename 12/08/12 0534 12/07/12 0643  WBC 9.9 12.9*  NEUTROABS -- --  HGB 9.0* 10.2*  HCT 28.3* 31.2*  MCV 95.9 96.0  PLT 318 353   BNP:  Basename 12/08/12 0534  PROBNP 16208.0*    Thyroid Function Tests:  Basename 12/07/12 1500  TSH --  T4TOTAL  --  FREET4 1.34  T3FREE --  THYROIDAB --   Misc. Labs:   Micro: Recent Results (from the past 240 hour(s))  URINE CULTURE     Status: Normal   Collection Time   12/04/12  7:59 PM      Component Value Range Status Comment   Specimen Description URINE, CLEAN CATCH   Final    Special Requests NONE   Final    Culture  Setup Time 12/05/2012 06:55   Final    Colony Count NO GROWTH   Final    Culture NO GROWTH   Final    Report Status 12/06/2012 FINAL   Final   MRSA PCR SCREENING     Status: Normal   Collection Time   12/08/12 11:07 AM      Component Value Range Status Comment   MRSA by PCR NEGATIVE  NEGATIVE Final     Studies/Results: Dg Chest Port 1 View  12/07/2012  *RADIOLOGY REPORT*  Clinical Data: Shortness of breath and wheezing.  PORTABLE CHEST - 1 VIEW  Comparison: Chest x-ray 12/04/2012.  Findings: Increasing bibasilar opacities may reflect worsening areas of atelectasis and/or consolidation, with increasing small bilateral pleural  effusions (left greater than right).  Pulmonary vasculature is normal.  Heart size is within normal limits. Atherosclerosis in the thoracic aorta.  IMPRESSION: 1.  Worsening bibasilar aeration may reflect increasing areas of atelectasis and/or consolidation in the lower lobes of the lungs bilaterally (left greater than right), with increasing small bilateral pleural effusions (left greater than right). 2.  Atherosclerosis.   Original Report Authenticated By: Trudie Reed, M.D.     Medications:  Scheduled:    . albuterol  2.5 mg Nebulization QID  . amLODipine  10 mg Oral Daily  . cefTRIAXone (ROCEPHIN)  IV  1 g Intravenous Q24H  . cloNIDine  0.1 mg Oral BID  . latanoprost  1 drop Both Eyes QHS  . levofloxacin (LEVAQUIN) IV  750 mg Intravenous Q48H  . levothyroxine  100 mcg Oral QAC breakfast  . loratadine  10 mg Oral Daily  . polyethylene glycol  17 g Oral Daily  . simvastatin  20 mg Oral q1800  . sodium chloride  3 mL Intravenous Q12H  .  terazosin  1 mg Oral QHS   Continuous:    . sodium chloride 75 mL/hr at 12/07/12 2323   ZOX:[id number], hydrALAZINE, HYDROmorphone (DILAUDID) injection, lidocaine, ondansetron (ZOFRAN) IV, sodium chloride, sodium chloride irrigation, sorbitol, sterile water for irrigation, traZODone  Assessment: Active Problems:  Hydronephrosis  Hyperkalemia  UTI (lower urinary tract infection)  Bladder mass  Acute renal failure  Hypertension  Hypothyroidism  SOB (shortness of breath)  Atelectasis of both lungs   Left hydronephrosis. Dr. Jerre Simon is following. Status post cystoscopy. The patient will be taken to the OR by Dr. Jerre Simon tomorrow for further evaluation of the bladder mass.  Possible bladder mass. Per Dr. Jerre Simon, surgery tomorrow.  Urinary tract infection. We'll continue ceftriaxone. Urine culture negative thus far.  Elevated BNP, 16,208 on January 13 No documented history of heart failure Will decrease IV fluids to 60 cc an hour and start IV Lasix at 20 mg IV every 12 hours. No pulmonary edema seen on the chest x-ray, but he does have some pleural effusions. We'll hold off on diagnosing "congestive heart failure " until the results of the 2-D echocardiogram.  2-D echo ordered.  Discuss with attending, we'll consider cardiology consultation only if needed.  Malignant hypertension.  Overall, his blood pressure is better with the changes noted. This may be exacerbated by Afrin nasal spray. Saline has been ordered instead. Amlodipine was started. Clonidine was added 10/07/2013.Marland Kitchen Also added  hydralazine IV when necessary SBP greater than 180.   Shortness of breath with mild bronchospasms. Query pneumonia and/or superimposed atelectasis. 1/12 He still has faint wheezes in the upper lobes. Albuterol nebulizer was started. Incentive spirometry was ordered. Levaquin was started as well for possible superimposed pneumonia. Query if the patient had mild aspiration as he had just finished  eating lunch.  Acute renal failure. His baseline creatinine from 4 1/2 to 5 years ago was within normal limits His renal failure is presumed to be from obstructive uropathy. Creatinine trending up at 2.26 on January 13  Hyperkalemia.  Resolved.  Hypothyroidism. Query noncompliance.  Synthroid dosing has been increased from 75 mcg to 100 mcg daily.  Anemia associated with acute blood loss. No indication for transfusion now.   Still with active gross hematuria.  Continue to monitor hemoglobin    LOS: 4 days   Stephani Police 12/08/2012, 2:16 PM    Attending: The patient was seen and examined. Patient was discussed with PA, Mr. Elyn Peers. The  above note has been amended and annotated.  Elliot Cousin, M.D.

## 2012-12-08 NOTE — Progress Notes (Signed)
PT Cancellation Note  Patient Details Name: Reginald Waller MRN: [id number] DOB: 08/16/1919   Cancelled Treatment:    Reason Eval/Treat Not Completed: Patient at procedure or test/unavailable   Myrlene Broker L 12/08/2012, 2:09 PM

## 2012-12-09 ENCOUNTER — Encounter (HOSPITAL_COMMUNITY)
Admission: EM | Disposition: A | Discharge status: Home Health | Payer: Self-pay | Source: Home / Self Care | Attending: Internal Medicine

## 2012-12-09 ENCOUNTER — Inpatient Hospital Stay (HOSPITAL_COMMUNITY): Payer: Medicare Other | Admitting: Anesthesiology

## 2012-12-09 ENCOUNTER — Encounter (HOSPITAL_COMMUNITY): Payer: Self-pay | Admitting: Anesthesiology

## 2012-12-09 ENCOUNTER — Encounter (HOSPITAL_COMMUNITY): Payer: Self-pay | Admitting: *Deleted

## 2012-12-09 HISTORY — PX: TRANSURETHRAL RESECTION OF BLADDER TUMOR: SHX2575

## 2012-12-09 LAB — BASIC METABOLIC PANEL
BUN: 32 mg/dL — ABNORMAL HIGH (ref 6–23)
Calcium: 8.5 mg/dL (ref 8.4–10.5)
Chloride: 109 mEq/L (ref 96–112)
Creatinine, Ser: 2.27 mg/dL — ABNORMAL HIGH (ref 0.50–1.35)
GFR calc Af Amer: 27 mL/min — ABNORMAL LOW (ref >90)
GFR calc non Af Amer: 24 mL/min — ABNORMAL LOW (ref >90)

## 2012-12-09 LAB — CBC
HCT: 29.1 % — ABNORMAL LOW (ref 39.0–52.0)
MCHC: 32.3 g/dL (ref 30.0–36.0)
Platelets: 328 10*3/uL (ref 150–400)
RDW: 15.4 % (ref 11.5–15.5)
WBC: 9.4 10*3/uL (ref 4.0–10.5)

## 2012-12-09 SURGERY — TURBT (TRANSURETHRAL RESECTION OF BLADDER TUMOR)
Anesthesia: Spinal

## 2012-12-09 SURGERY — TURBT (TRANSURETHRAL RESECTION OF BLADDER TUMOR)
Anesthesia: Spinal | Site: Bladder | Wound class: Clean Contaminated

## 2012-12-09 MED ORDER — ONDANSETRON HCL 4 MG/2ML IJ SOLN: 4.0000 mg | Freq: Once | INTRAMUSCULAR | Status: DC | PRN

## 2012-12-09 MED ORDER — MIDAZOLAM HCL 2 MG/2ML IJ SOLN
INTRAMUSCULAR | Status: AC
  Filled 2012-12-09: qty 2

## 2012-12-09 MED ORDER — BUPIVACAINE IN DEXTROSE 0.75-8.25 % IT SOLN
INTRATHECAL | Status: DC | PRN
  Administered 2012-12-09: 15 mg via INTRATHECAL

## 2012-12-09 MED ORDER — GLYCINE 1.5 % IR SOLN
Status: DC | PRN
  Administered 2012-12-09 (×9): 3000 mL

## 2012-12-09 MED ORDER — FENTANYL CITRATE 0.05 MG/ML IJ SOLN
INTRAMUSCULAR | Status: DC | PRN
  Administered 2012-12-09: 12.5 ug via INTRATHECAL

## 2012-12-09 MED ORDER — FUROSEMIDE 10 MG/ML IJ SOLN: 20.0000 mg | Freq: Every day | INTRAMUSCULAR | Status: DC

## 2012-12-09 MED ORDER — STERILE WATER FOR IRRIGATION IR SOLN
Status: DC | PRN
  Administered 2012-12-09: 500 mL

## 2012-12-09 MED ORDER — EPHEDRINE SULFATE 50 MG/ML IJ SOLN
INTRAMUSCULAR | Status: AC
  Filled 2012-12-09: qty 1

## 2012-12-09 MED ORDER — PROPOFOL 10 MG/ML IV EMUL
INTRAVENOUS | Status: AC
  Filled 2012-12-09: qty 20

## 2012-12-09 MED ORDER — LIDOCAINE HCL (PF) 1 % IJ SOLN
INTRAMUSCULAR | Status: AC
  Filled 2012-12-09: qty 5

## 2012-12-09 MED ORDER — MIDAZOLAM HCL 2 MG/2ML IJ SOLN
1.0000 mg | INTRAMUSCULAR | Status: DC | PRN
  Administered 2012-12-09: 2 mg via INTRAVENOUS

## 2012-12-09 MED ORDER — BUPIVACAINE IN DEXTROSE 0.75-8.25 % IT SOLN
INTRATHECAL | Status: AC
  Filled 2012-12-09: qty 2

## 2012-12-09 MED ORDER — LIDOCAINE HCL (CARDIAC) 10 MG/ML IV SOLN
INTRAVENOUS | Status: DC | PRN
  Administered 2012-12-09: 10 mg via INTRAVENOUS

## 2012-12-09 MED ORDER — FENTANYL CITRATE 0.05 MG/ML IJ SOLN
INTRAMUSCULAR | Status: AC
  Filled 2012-12-09: qty 2

## 2012-12-09 MED ORDER — EPHEDRINE SULFATE 50 MG/ML IJ SOLN
INTRAMUSCULAR | Status: DC | PRN
  Administered 2012-12-09 (×5): 5 mg via INTRAVENOUS

## 2012-12-09 MED ORDER — LACTATED RINGERS IV SOLN: INTRAVENOUS | Status: DC

## 2012-12-09 MED ORDER — SODIUM CHLORIDE 0.9 % IV SOLN
INTRAVENOUS | Status: DC
  Administered 2012-12-09: 1000 mL via INTRAVENOUS

## 2012-12-09 MED ORDER — SODIUM CHLORIDE 0.9 % IR SOLN
Status: DC | PRN
  Administered 2012-12-09: 3000 mL

## 2012-12-09 MED ORDER — PROPOFOL INFUSION 10 MG/ML OPTIME
INTRAVENOUS | Status: DC | PRN
  Administered 2012-12-09: 50 ug/kg/min via INTRAVENOUS

## 2012-12-09 MED ORDER — FENTANYL CITRATE 0.05 MG/ML IJ SOLN: 25.0000 ug | INTRAMUSCULAR | Status: DC | PRN

## 2012-12-09 SURGICAL SUPPLY — 29 items
BAG DECANTER FOR FLEXI CONT (MISCELLANEOUS) ×2 IMPLANT
BAG DRAIN URO TABLE W/ADPT NS (DRAPE) ×2 IMPLANT
BAG DRN 8 ADPR NS SKTRN CSTL (DRAPE) ×1
BAG DRN URN TUBE DRIP CHMBR (OSTOMY) ×1
BAG URINE DRAIN TURP 4L (OSTOMY) ×2 IMPLANT
BAG URINE DRAINAGE (UROLOGICAL SUPPLIES) ×4 IMPLANT
CABLE HI FREQUENCY MONOPOLAR (ELECTROSURGICAL) ×2 IMPLANT
CATH FOLEY 2WAY SLVR  5CC 20FR (CATHETERS) ×2
CATH FOLEY 2WAY SLVR 5CC 20FR (CATHETERS) ×2 IMPLANT
CATH FOLEY 3WAY 30CC 20FR (CATHETERS) ×2 IMPLANT
CLOTH BEACON ORANGE TIMEOUT ST (SAFETY) ×2 IMPLANT
CONNECTOR 5 IN 1 STRAIGHT STRL (MISCELLANEOUS) ×2 IMPLANT
ELECT CUT LOOP C-MAX 27FR .012 (CUTTING LOOP) ×2
ELECTRODE BALL 24/28FR 5MM (UROLOGICAL SUPPLIES) ×2 IMPLANT
ELECTRODE CUT LP CMX 27FR .012 (CUTTING LOOP) ×1 IMPLANT
FORMALIN 10 PREFIL 120ML (MISCELLANEOUS) IMPLANT
GLOVE BIO SURGEON STRL SZ7 (GLOVE) ×2 IMPLANT
GLYCINE 1.5% IRRIG UROMATIC (IV SOLUTION) ×18 IMPLANT
GOWN STRL REIN XL XLG (GOWN DISPOSABLE) ×2 IMPLANT
IV NS IRRIG 3000ML ARTHROMATIC (IV SOLUTION) ×4 IMPLANT
KIT ROOM TURNOVER AP CYSTO (KITS) ×2 IMPLANT
MANIFOLD NEPTUNE II (INSTRUMENTS) ×2 IMPLANT
PACK CYSTO (CUSTOM PROCEDURE TRAY) ×2 IMPLANT
PAD ARMBOARD 7.5X6 YLW CONV (MISCELLANEOUS) ×2 IMPLANT
PAD TELFA 3X4 1S STER (GAUZE/BANDAGES/DRESSINGS) ×2 IMPLANT
SET IRRIGATING DISP (SET/KITS/TRAYS/PACK) ×2 IMPLANT
SYR 30ML LL (SYRINGE) ×2 IMPLANT
TOWEL OR 17X26 4PK STRL BLUE (TOWEL DISPOSABLE) ×2 IMPLANT
YANKAUER SUCT BULB TIP 10FT TU (MISCELLANEOUS) ×2 IMPLANT

## 2012-12-09 NOTE — Anesthesia Postprocedure Evaluation (Signed)
Anesthesia Post Note  Patient: Reginald Waller  Procedure(s) Performed: Procedure(s) (LRB): TRANSURETHRAL RESECTION OF BLADDER TUMOR (TURBT) (N/A)  Anesthesia type: Spinal  Patient location: PACU  Post pain: Pain level controlled  Post assessment: Post-op Vital signs reviewed, Patient's Cardiovascular Status Stable, Respiratory Function Stable, Patent Airway, No signs of Nausea or vomiting and Pain level controlled  Last Vitals:  Filed Vitals:   12/09/12 1304  BP: 119/50  Pulse: 76  Temp: 36 C  Resp: 17    Post vital signs: Reviewed and stable  Level of consciousness: awake and alert   Complications: No apparent anesthesia complications

## 2012-12-09 NOTE — Addendum Note (Signed)
Addendum  created 12/09/12 1312 by Franco Nones, CRNA   Modules edited:Anesthesia Medication Administration

## 2012-12-09 NOTE — Care Management Note (Signed)
    Page 1 of 2   12/11/2012     3:00:55 PM   CARE MANAGEMENT NOTE 12/11/2012  Patient:  Reginald Waller, Reginald Waller   Account Number:  [id number]  Date Initiated:  12/09/2012  Documentation initiated by:  Rosemary Holms  Subjective/Objective Assessment:   Pt admitted from home where he lives with his spouse. Anemia, renal failure, hematuria. Having TURP today. Prior to surgery, PT recommended HH PT.     Action/Plan:   After surgery will reassess HH needs.   Anticipated DC Date:  12/11/2012   Anticipated DC Plan:  HOME W HOME HEALTH SERVICES      DC Planning Services  CM consult      Choice offered to / List presented to:     DME arranged  [facility]ALK[facility] - Lavone Nian      DME agency  [facility]     [facility] arranged  HH-1 RN  HH-10 DISEASE MANAGEMENT  HH-2 PT      [facility] agency  [facility]   Status of service:  Completed, signed off Medicare Important Message given?  YES (If response is "NO", the following Medicare IM given date fields will be blank) Date Medicare IM given:  12/11/2012 Date Additional Medicare IM given:    Discharge Disposition:  HOME W HOME HEALTH SERVICES  Per UR Regulation:    If discussed at Long Length of Stay Meetings, dates discussed:   12/09/2012  12/11/2012    Comments:  12/11/12 Rosemary Holms RN BSN CM Pt's diagnosis does not qualify him for O2. O2 offered by [facility] self pay but since O2 sats were borderline, pt declined and will call his PCP if thing change of PCP can document a chronic diagnosis. [facility] set up for PT and RN. DME rolling walker left in room with pt, wife and niece.  12/09/12 Rosemary Holms RN BSN CM

## 2012-12-09 NOTE — Evaluation (Signed)
Physical Therapy Evaluation Patient Details Name: Reginald Waller MRN: [id number] DOB: 10-Mar-1919 Today's Date: 12/09/2012 Time: 4098-1191 PT Time Calculation (min): 20 min  PT Assessment / Plan / Recommendation Clinical Impression  Pt is very alert and oriented, active at home PTA. His strength is WNL but his standing balance by observation is deficient and he tends to fall backward at tiimes. He is much more stable ambulating with a walker and would benerfit from HHPT at d/c     PT Assessment  Patient needs continued PT services    Follow Up Recommendations  Home health PT    Does the patient have the potential to tolerate intense rehabilitation      Barriers to Discharge None      Equipment Recommendations  Rolling walker with 5" wheels    Recommendations for Other Services     Frequency Min 3X/week    Precautions / Restrictions Precautions Precautions: Fall Restrictions Weight Bearing Restrictions: No   Pertinent Vitals/Pain       Mobility  Bed Mobility Bed Mobility: Supine to Sit;Sit to Supine Supine to Sit: 5: Supervision Sitting - Scoot to Edge of Bed: 6: Modified independent (Device/Increase time) Sit to Supine: 6: Modified independent (Device/Increase time) Transfers Transfers: Sit to Stand;Stand to Sit Sit to Stand: 5: Supervision;With upper extremity assist;From bed Stand to Sit: 5: Supervision;To bed Ambulation/Gait Ambulation/Gait Assistance: 5: Supervision Assistive device: Rolling walker Gait Pattern: Within Functional Limits;Trunk flexed Gait velocity: WNL Stairs: No Wheelchair Mobility Wheelchair Mobility: No    Shoulder Instructions     Exercises     PT Diagnosis: Difficulty walking;Generalized weakness (mild deconditioning)  PT Problem List: Decreased activity tolerance;Decreased balance;Decreased mobility;Decreased knowledge of use of DME;Cardiopulmonary status limiting activity (becomes dyspneic with activity) PT Treatment  Interventions: DME instruction;Gait training;Therapeutic exercise;Patient/family education   PT Goals Acute Rehab PT Goals PT Goal Formulation: With patient Time For Goal Achievement: 12/09/12 Potential to Achieve Goals: Good Pt will Ambulate: 51 - 150 feet;with modified independence;with rolling walker  Visit Information  Last PT Received On: 12/09/12 Reason Eval/Treat Not Completed: Patient at procedure or test/unavailable    Subjective Data  Subjective: Loses balance backward Patient Stated Goal: return home   Prior Functioning  Home Living Home Access: Level entry Home Layout: One level Bathroom Toilet: Standard Home Adaptive Equipment: Straight cane Prior Function Level of Independence: Independent with assistive device(s) Vocation: Retired Musician: No difficulties Dominant Hand: Right    Cognition  Overall Cognitive Status: Appears within functional limits for tasks assessed/performed Arousal/Alertness: Awake/alert Orientation Level: Appears intact for tasks assessed Behavior During Session: [facility] for tasks performed    Extremity/Trunk Assessment Right Lower Extremity Assessment RLE ROM/Strength/Tone: Within functional levels RLE Sensation: WFL - Light Touch RLE Coordination: WFL - gross motor Left Lower Extremity Assessment LLE ROM/Strength/Tone: Within functional levels LLE Sensation: WFL - Light Touch LLE Coordination: WFL - gross motor Trunk Assessment Trunk Assessment: Normal   Balance Balance Balance Assessed: No  End of Session PT - End of Session Equipment Utilized During Treatment: Gait belt Activity Tolerance: Patient tolerated treatment well Patient left: in bed (being taken to surgery) Nurse Communication: Mobility status  GP     Konrad Penta 12/09/2012, 3:41 PM

## 2012-12-09 NOTE — Clinical Social Work Note (Signed)
CSW received referral for possible SNF. Pt evaluated by PT this morning and recommendation is for home health. PT reports family was present and agreeable to d/c plan as they were very pleased with pt's performance. CSW signing off but can be reconsulted if needed.   Derenda Fennel, [place] [phone]

## 2012-12-09 NOTE — Op Note (Signed)
NAMEDAIRON, PROCTER NO.:  [id number]  MEDICAL RECORD NO.:  [id number]  LOCATION:  A331                          FACILITY:  [facility]  PHYSICIAN:  Ky Barban, M.D.DATE OF BIRTH:  01-27-1919  DATE OF PROCEDURE: DATE OF DISCHARGE:                              OPERATIVE REPORT   PREOPERATIVE DIAGNOSIS:  Possible bladder tumor.  POSTOPERATIVE DIAGNOSIS:  Bladder tumor.  PROCEDURE:  Cystoscopy.  PROCEDURE IN DETAIL:  The patient was placed in supine position, usual prep and drape, Xylocaine jelly was instilled into the urethra.  After waiting adequate time, flexible cystoscope was introduced into the bladder.  Prostatic urethra was opened.  Bladder shows solid growth on the left bladder wall on the left hemi trigone and also going towards the right hemi trigone.  The rest of the bladder grossly looks normal. It looks like it is a bladder tumor or solid, which means it is a high- grade tumor.  Cystoscope was removed.  The patient left the procedure room in satisfactory condition.     Ky Barban, M.D.     MIJ/MEDQ  D:  12/08/2012  T:  12/09/2012  Job:  [id number]

## 2012-12-09 NOTE — Brief Op Note (Signed)
12/04/2012 - 12/09/2012  12:54 PM  PATIENT:  Reginald Waller  79 y.o. male  PRE-OPERATIVE DIAGNOSIS:  bladder tumor  POST-OPERATIVE DIAGNOSIS:  bladder tumor  PROCEDURE:  Procedure(s) (LRB) with comments: TRANSURETHRAL RESECTION OF BLADDER TUMOR (TURBT) (N/A)  SURGEON:  Surgeon(s) and Role:    * Ky Barban, MD - Primary  PHYSICIAN ASSISTANT:   ASSISTANTS: none   ANESTHESIA:   spinal  EBL:  Total I/O In: 300 [I.V.:300] Out: 1250 [Urine:1000; Blood:250]  BLOOD ADMINISTERED:none  DRAINS: Urinary Catheter (Foley)   LOCAL MEDICATIONS USED:  NONE  SPECIMEN:  Source of Specimen:  bladder tumor  ba  DISPOSITION OF SPECIMEN:  PATHOLOGY  COUNTS:  YES  TOURNIQUET:  * No tourniquets in log *  DICTATION: .Other Dictation: Dictation Number [id number]  PLAN OF CARE: Admit to inpatient   PATIENT DISPOSITION:  PACU - hemodynamically stable.   Delay start of Pharmacological VTE agent (>24hrs) due to surgical blood loss or risk of bleeding:

## 2012-12-09 NOTE — Progress Notes (Signed)
No change in H&P on reexamination. 

## 2012-12-09 NOTE — Anesthesia Preprocedure Evaluation (Signed)
Anesthesia Evaluation  Patient identified by MRN, date of birth, ID band Patient awake    Reviewed: Allergy & Precautions, H&P , NPO status , Patient's Chart, lab work & pertinent test results  Airway Mallampati: II TM Distance: >3 FB     Dental  (+) Upper Dentures, Poor Dentition and Partial Lower   Pulmonary shortness of breath, pneumonia -, resolved,  + rhonchi         Cardiovascular hypertension, Rhythm:Regular Rate:Normal     Neuro/Psych CVA    GI/Hepatic negative GI ROS,   Endo/Other  Hypothyroidism   Renal/GU ARFRenal disease     Musculoskeletal   Abdominal   Peds  Hematology   Anesthesia Other Findings   Reproductive/Obstetrics                           Anesthesia Physical Anesthesia Plan  ASA: III  Anesthesia Plan: Spinal   Post-op Pain Management:    Induction: Intravenous  Airway Management Planned: Simple Face Mask  Additional Equipment:   Intra-op Plan:   Post-operative Plan:   Informed Consent: I have reviewed the patients History and Physical, chart, labs and discussed the procedure including the risks, benefits and alternatives for the proposed anesthesia with the patient or authorized representative who has indicated his/her understanding and acceptance.     Plan Discussed with:   Anesthesia Plan Comments:         Anesthesia Quick Evaluation

## 2012-12-09 NOTE — Anesthesia Procedure Notes (Addendum)
Spinal  Patient location during procedure: OR Start time: 12/09/2012 11:25 AM End time: 12/09/2012 11:33 AM Staffing CRNA/Resident: Minerva Areola S Preanesthetic Checklist Completed: patient identified, site marked, surgical consent, pre-op evaluation, timeout performed, IV checked, risks and benefits discussed and monitors and equipment checked Spinal Block Patient position: left lateral decubitus Prep: Betadine Patient monitoring: heart rate, cardiac monitor, continuous pulse ox and blood pressure Approach: left paramedian Location: L3-4 Injection technique: single-shot Needle Needle type: Spinocan  Needle gauge: 22 G Needle length: 9 cm Assessment Sensory level: T6 Additional Notes Betadine prep x3 1% lidocaine skinwheal  Clear CSF pre and post injection  ATTEMPTS:2 TRAY ID: [id number] TRAY EXPIRATION DATE: 2014-07  Procedure Name: MAC Date/Time: 12/09/2012 11:09 AM Performed by: Franco Nones Pre-anesthesia Checklist: Patient identified, Emergency Drugs available, Suction available, Timeout performed and Patient being monitored Patient Re-evaluated:Patient Re-evaluated prior to inductionOxygen Delivery Method: Non-rebreather mask

## 2012-12-09 NOTE — Transfer of Care (Signed)
Immediate Anesthesia Transfer of Care Note  Patient: [name]  Procedure(s) Performed: Procedure(s) (LRB): TRANSURETHRAL RESECTION OF BLADDER TUMOR (TURBT) (N/A)  Patient Location: PACU  Anesthesia Type: SAB  Level of Consciousness: awake  Airway & Oxygen Therapy: Patient Spontanous Breathing and non-rebreather face mask  Post-op Assessment: Report given to PACU RN, Post -op Vital signs reviewed and stable. SAB Level  T 10  Post vital signs: Reviewed and stable  Complications: No apparent anesthesia complications

## 2012-12-09 NOTE — Progress Notes (Signed)
Subjective: The patient is just back from TURBT.  Resting in bed.  He says he is chilled and hungry.  Asks for another blanket.  Objective: Vital signs in last 24 hours: Filed Vitals:   12/09/12 1345 12/09/12 1400 12/09/12 1415 12/09/12 1448  BP: 120/52 130/50 118/56 134/56  Pulse: 76 74 74 83  Temp:   97.2 F (36.2 C) 97.2 F (36.2 C)  TempSrc:      Resp: 18 14 15 16   Height:      Weight:      SpO2: 95% 93% 94% 100%    Intake/Output Summary (Last 24 hours) at 12/09/12 1537 Last data filed at 12/09/12 1312  Gross per 24 hour  Intake   1255 ml  Output   2250 ml  Net   -995 ml    Weight change: 1.2 kg (2 lb 10.3 oz)  Physical exam: General: Awake and alert, Pleasant. Lungs: Faint upper airway wheezes;  slight gurgling in his throat.  No accessory muscle movement. Heart: S1, S2, regular rate and rhythm, no obvious murmurs rubs or gallops Abdomen: Slightly distended, active bowel sounds, nontender, no obvious masses Extremities: No pedal edema. Able to move all 4 extremities Neurologic: He is alert and oriented to himself and hospital. Cranial nerves II through XII are grossly intact with exception of decrease in his hearing acuity.   Lab Results: Basic Metabolic Panel:  Basename 12/09/12 0602 12/08/12 0534  NA 139 140  K 4.8 4.5  CL 109 111  CO2 19 20  GLUCOSE 97 92  BUN 32* 32*  CREATININE 2.27* 2.26*  CALCIUM 8.5 8.6  MG -- --  PHOS -- --   CBC:  Basename 12/09/12 0602 12/08/12 0534  WBC 9.4 9.9  NEUTROABS -- --  HGB 9.4* 9.0*  HCT 29.1* 28.3*  MCV 95.4 95.9  PLT 328 318   BNP:  Basename 12/08/12 0534  PROBNP 16208.0*    Thyroid Function Tests:  Basename 12/07/12 1500  TSH --  T4TOTAL --  FREET4 1.34  T3FREE --  THYROIDAB --   Misc. Labs:   Micro: Recent Results (from the past 240 hour(s))  URINE CULTURE     Status: Normal   Collection Time   12/04/12  7:59 PM      Component Value Range Status Comment   Specimen Description URINE,  CLEAN CATCH   Final    Special Requests NONE   Final    Culture  Setup Time 12/05/2012 06:55   Final    Colony Count NO GROWTH   Final    Culture NO GROWTH   Final    Report Status 12/06/2012 FINAL   Final   MRSA PCR SCREENING     Status: Normal   Collection Time   12/08/12 11:07 AM      Component Value Range Status Comment   MRSA by PCR NEGATIVE  NEGATIVE Final     Studies/Results: No results found.  Medications:  Scheduled:    . albuterol  2.5 mg Nebulization QID  . amLODipine  10 mg Oral Daily  . cefTRIAXone (ROCEPHIN)  IV  1 g Intravenous Q24H  . cloNIDine  0.1 mg Oral BID  . furosemide  20 mg Intravenous Daily  . latanoprost  1 drop Both Eyes QHS  . levofloxacin (LEVAQUIN) IV  750 mg Intravenous Q48H  . levothyroxine  100 mcg Oral QAC breakfast  . loratadine  10 mg Oral Daily  . polyethylene glycol  17 g Oral Daily  .  simvastatin  20 mg Oral q1800  . sodium chloride  3 mL Intravenous Q12H  . terazosin  1 mg Oral QHS   Continuous:    . sodium chloride 0.45 % 1,000 mL with potassium chloride 20 mEq infusion 60 mL/hr at 12/09/12 1442   BMW:[id number], hydrALAZINE, hydrALAZINE, HYDROmorphone (DILAUDID) injection, ondansetron (ZOFRAN) IV, sodium chloride, sorbitol, traZODone  Assessment: Active Problems:  Hydronephrosis  Hyperkalemia  UTI (lower urinary tract infection)  Bladder mass  Acute renal failure  Hypertension  Hypothyroidism  SOB (shortness of breath)  Atelectasis of both lungs  Pleural effusion  Pneumonia   Left hydronephrosis.  Dr. Jerre Simon is following.   Bladder mass.  Per Dr. Jerre Simon, Status post cystoscopy 1/13, and TURBT on 1/14.  Biopsy results pending.  Urinary tract infection.   Urine Culture negative.  Rocephin previously discontinued.   Levaquin on board for possible pneumonia/bronchitis.  Elevated BNP, 16,208 on January 13 No documented history of heart failure Appears Euvolemic with no outward signs of Heart Failure. Will  adjust fluids with urology and d/c lasix if Echo shows no significant LV or RV dysfunction. No pulmonary edema seen on the chest x-ray, but he does have some pleural effusions.  We'll hold off on diagnosing "congestive heart failure " until the results of the 2-D echocardiogram.   2D echo completed 1/13 at 2:26 pm.  Results pending.  Malignant hypertension.  Now well controlled.  On amlodipine, lasix, and prn hydralazine.  Shortness of breath with mild bronchospasms. - Resolved. Possible pneumonia and/or superimposed atelectasis. 1/12 WBC trended down. Albuterol nebulizer was started. Incentive spirometry was ordered. Levaquin was started as well for possible superimposed pneumonia.  Acute renal failure.  His baseline creatinine from 4 1/2 to 5 years ago was within normal limits His renal failure is presumed to be from obstructive uropathy. Creatinine still elevated, but relatively stable over the past 2 days; and 2.26 - 2.27 Continue to monitor.   Hyperkalemia.  Resolved. K is 4.8 on 1/14.  Will adjust IVF with urology..  Hypothyroidism. Query noncompliance.  Synthroid dosing has been increased from 75 mcg to 100 mcg daily.  Anemia associated with acute blood loss. No indication for transfusion now.   Still with active gross hematuria.  Continue to monitor hemoglobin    LOS: 5 days   Stephani Police 12/09/2012, 3:37 PM   Attending: The patient was seen and examined. The patient was discussed with PA, Mrs. Elyn Peers. Above note amended and annotated. 2-D echocardiogram is pending. Lasix has been decreased to once daily. Will await the results of the 2-D echocardiogram to determine whether or not the patient needs continued Lasix. Would continue IV fluids at [facility]. The patient is undergoing continuous bladder irrigation per Dr. Jerre Simon. We'll continue to monitor his CBC, specifically his hemoglobin/hematocrit.  Elliot Cousin, M.D.

## 2012-12-09 NOTE — Progress Notes (Signed)
UR Chart Review Completed  

## 2012-12-10 LAB — CBC
HCT: 29 % — ABNORMAL LOW (ref 39.0–52.0)
MCH: 30.5 pg (ref 26.0–34.0)
MCHC: 32.4 g/dL (ref 30.0–36.0)
MCV: 94.2 fL (ref 78.0–100.0)
RDW: 15.1 % (ref 11.5–15.5)

## 2012-12-10 LAB — BASIC METABOLIC PANEL
BUN: 30 mg/dL — ABNORMAL HIGH (ref 6–23)
CO2: 20 mEq/L (ref 19–32)
Chloride: 107 mEq/L (ref 96–112)
Creatinine, Ser: 1.62 mg/dL — ABNORMAL HIGH (ref 0.50–1.35)

## 2012-12-10 NOTE — Progress Notes (Signed)
Afebrile feeels fine. cbi is clear abdomen is soft. Plan dc cbi oob regular diet if urine stays clear will dc foley tomorrow cbc bmet normal today hct 29 stable.

## 2012-12-10 NOTE — Anesthesia Postprocedure Evaluation (Signed)
Anesthesia Post Note  Patient: Reginald Waller  Procedure(s) Performed: Procedure(s) (LRB): TRANSURETHRAL RESECTION OF BLADDER TUMOR (TURBT) (N/A)  Anesthesia type: Spinal  Patient location: 331  Post pain: Pain level controlled  Post assessment: Post-op Vital signs reviewed, Patient's Cardiovascular Status Stable, Respiratory Function Stable, Patent Airway, No signs of Nausea or vomiting and Pain level controlled  Last Vitals:  Filed Vitals:   12/10/12 1412  BP: 146/73  Pulse: 86  Temp: 36.6 C  Resp: 20    Post vital signs: Reviewed and stable  Level of consciousness: awake and alert   Complications: No apparent anesthesia complications

## 2012-12-10 NOTE — Op Note (Signed)
NAMESOHAIB, VEREEN NO.:  [id number]  MEDICAL RECORD NO.:  [id number]  LOCATION:  A331                          FACILITY:  [facility]  PHYSICIAN:  Ky Barban, M.D.DATE OF BIRTH:  Mar 27, 1919  DATE OF PROCEDURE: DATE OF DISCHARGE:                              OPERATIVE REPORT   PREOPERATIVE DIAGNOSIS:  Large solid bladder tumor, left bladder wall.  POSTOPERATIVE DIAGNOSIS: Large solid bladder tumor, left bladder wall.  PROCEDURE:  TUR bladder tumor and TUR of blood clots.  ANESTHESIA:  Spinal.  ESTIMATED BLOOD LOSS:  About 100 mL.  COMPLICATIONS:  None.  PROCEDURE:  The patient under spinal anesthesia in lithotomy position, after usual prep and drape, #28 Iglesias resectoscope was introduced into the bladder.  The tumor was located on the left hemi trigone going across the midline towards the right side.  There was a large blood clot sticking to the left bladder wall right on top of the tumor.  TUR of bladder tumor, was started on the surface went down to the muscular layer.  Some of the area as I can see the pinkish muscle but mostly there is still tumor there with the help of the cold biopsy forceps. Then, I took some biopsies at the tumor site.  Deep right where the pinkish muscle walls.  Then, I tried to get the blood clots out with Ellik evacuator.  If they won't come out, I had to cut them out with the help of the resectoscope.  When I removed the blood clots, there was a papillary and solid tumor underneath which was resected.  Most of the visible tumor was resected.  Bleeders were coagulated.  The patient is stable.  I think there is still considerable tissue but I do not think I can get that out like this.  All the chips were evacuated.  Bleeders were coagulated.  Resectoscope was removed and 22 three-way Foley catheter left in for drainage.  CBI started, which was clear.  The patient left the operating room in satisfactory condition.  At the  end, I did a bimanual pelvic exam.  I can feel solid area on the left side, but not on the right side.    Ky Barban, M.D.    MIJ/MEDQ  D:  12/09/2012  T:  12/10/2012  Job:  [id number]

## 2012-12-10 NOTE — Addendum Note (Signed)
Addendum  created 12/10/12 1537 by Franco Nones, CRNA   Modules edited:Notes Section

## 2012-12-10 NOTE — Progress Notes (Signed)
Physical Therapy Treatment Patient Details Name: Reginald Waller MRN: [id number] DOB: June 06, 1919 Today's Date: 12/10/2012 Time: 9562-1308 PT Time Calculation (min): 24 min 1 therex 1 gt  PT Assessment / Plan / Recommendation Comments on Treatment Session  Patient completed 140' of gait with RW; supervision;no LOB today, anxious to go home. Bed exercises done without difficulty. Using RW still reccommended as opposed to cane for safety however    Follow Up Recommendations        Does the patient have the potential to tolerate intense rehabilitation     Barriers to Discharge        Equipment Recommendations       Recommendations for Other Services    Frequency     Plan      Precautions / Restrictions     Pertinent Vitals/Pain     Mobility  Bed Mobility Supine to Sit: 6: Modified independent (Device/Increase time) Sitting - Scoot to Edge of Bed: 6: Modified independent (Device/Increase time) Transfers Sit to Stand: 5: Supervision Stand to Sit: 5: Supervision Ambulation/Gait Ambulation/Gait Assistance: 5: Supervision Ambulation Distance (Feet): 140 Feet Assistive device: Rolling walker Gait Pattern: Within Functional Limits;Trunk flexed    Exercises General Exercises - Lower Extremity Ankle Circles/Pumps: Both;10 reps Heel Slides: 10 reps;Both Hip ABduction/ADduction: 10 reps;Both   PT Diagnosis:    PT Problem List:   PT Treatment Interventions:     PT Goals    Visit Information  Last PT Received On: 12/10/12    Subjective Data      Cognition       Balance     End of Session PT - End of Session Equipment Utilized During Treatment: Gait belt Activity Tolerance: Patient tolerated treatment well Patient left: in bed;with bed alarm set;with call bell/phone within reach   GP     Korey Arroyo ATKINSO 12/10/2012, 1:55 PM

## 2012-12-10 NOTE — Progress Notes (Signed)
Subjective: Patient asks me what time he is going home today. He denies any pain.  Objective: Vital signs in last 24 hours: Filed Vitals:   12/10/12 0601 12/10/12 0707 12/10/12 1412 12/10/12 1550  BP: 160/72  146/73   Pulse: 84  86   Temp: 98.4 F (36.9 C)  97.8 F (36.6 C)   TempSrc:   Oral   Resp: 22  20   Height:      Weight:      SpO2: 97% 96% 98% 99%    Intake/Output Summary (Last 24 hours) at 12/10/12 1654 Last data filed at 12/10/12 0400  Gross per 24 hour  Intake   6240 ml  Output  69629 ml  Net  -9060 ml    Weight change: 1.9 kg (4 lb 3 oz)  Physical exam: General: Awake and alert, Pleasant.  Lying comfortably in bed. Smiling Lungs: Clear consultation no wheezes crackles or rales, No accessory muscle movement. Heart: S1, S2, regular rate and rhythm, no obvious murmurs rubs or gallops Abdomen: Slightly distended, active bowel sounds, nontender, no obvious masses Extremities: No pedal edema. Able to move all 4 extremities. Left arm with erythema and slight edema. Neurologic: He is alert and oriented to himself and hospital. Cranial nerves II through XII are grossly intact with exception of decrease in his hearing acuity. GU: Foley with bladder irrigation in place, no hematuria noted in Foley bag, only clear fluid   Lab Results: Basic Metabolic Panel:  Basename 12/10/12 0547 12/09/12 0602  NA 136 139  K 4.5 4.8  CL 107 109  CO2 20 19  GLUCOSE 92 97  BUN 30* 32*  CREATININE 1.62* 2.27*  CALCIUM 8.5 8.5  MG -- --  PHOS -- --   CBC:  Basename 12/10/12 0547 12/09/12 0602  WBC 9.0 9.4  NEUTROABS -- --  HGB 9.4* 9.4*  HCT 29.0* 29.1*  MCV 94.2 95.4  PLT 358 328   BNP:  Basename 12/08/12 0534  PROBNP 16208.0*    Misc. Labs:   Micro: Recent Results (from the past 240 hour(s))  URINE CULTURE     Status: Normal   Collection Time   12/04/12  7:59 PM      Component Value Range Status Comment   Specimen Description URINE, CLEAN CATCH   Final    Special Requests NONE   Final    Culture  Setup Time 12/05/2012 06:55   Final    Colony Count NO GROWTH   Final    Culture NO GROWTH   Final    Report Status 12/06/2012 FINAL   Final   MRSA PCR SCREENING     Status: Normal   Collection Time   12/08/12 11:07 AM      Component Value Range Status Comment   MRSA by PCR NEGATIVE  NEGATIVE Final     Studies/Results: No results found. 2-D echocardiogram results - Left ventricle: The cavity size was normal. There was mild to moderate concentric hypertrophy. Systolic function was normal. The estimated ejection fraction was in the range of 60% to 65%. Wall motion was normal; there were no regional wall motion abnormalities. - Mitral valve: Moderately to severely calcified annulus. Mildly thickened, moderately calcified leaflets . - Atrial septum: No defect or patent foramen ovale was identified. - Pericardium, extracardiac: There was a small right pleural effusion. There was a moderate-sized left pleural effusion.  Medications:  Scheduled:    . albuterol  2.5 mg Nebulization QID  . amLODipine  10 mg  Oral Daily  . cloNIDine  0.1 mg Oral BID  . latanoprost  1 drop Both Eyes QHS  . levofloxacin (LEVAQUIN) IV  750 mg Intravenous Q48H  . levothyroxine  100 mcg Oral QAC breakfast  . loratadine  10 mg Oral Daily  . polyethylene glycol  17 g Oral Daily  . simvastatin  20 mg Oral q1800  . sodium chloride  3 mL Intravenous Q12H  . terazosin  1 mg Oral QHS   Continuous:   ZOX:[id number], hydrALAZINE, hydrALAZINE, HYDROmorphone (DILAUDID) injection, ondansetron (ZOFRAN) IV, sodium chloride, sorbitol, traZODone  Assessment: Active Problems:  Hydronephrosis  Hyperkalemia  UTI (lower urinary tract infection)  Bladder mass  Acute renal failure  Hypertension  Hypothyroidism  SOB (shortness of breath)  Atelectasis of both lungs  Pleural effusion  Pneumonia   Left hydronephrosis.  Dr. Jerre Simon is following.   Bladder mass.  Per Dr.  Jerre Simon, Status post cystoscopy 1/13, and TURBT on 1/14.  Biopsy results pending.  There appeared to have been no complications from the procedure. The patient remains on bladder irrigation.  Does Hytrin need to be resumed at discharge?  Urinary tract infection.   Urine Culture negative.  Rocephin previously discontinued.   Levaquin on board for possible pneumonia/bronchitis.  Elevated BNP, 16,208 on January 13 Will recheck BN P. on January 16 No documented history of heart failure Appears Euvolemic with no outward signs of Heart Failure. No pulmonary edema seen on the chest x-ray, but he does have some pleural effusions.  2-D echo shows normal LVEF, with no wall motion abnormalities. He does have moderate to severely calcified annulus and leaflets.  Malignant hypertension.  Now well controlled.  On amlodipine, prn hydralazine. Question whether or not his Hytrin needs to be resumed at discharge.  Shortness of breath with mild bronchospasms. - Resolved. Possible pneumonia and/or superimposed atelectasis. 1/12 WBC trended down. Albuterol nebulizer was started. Incentive spirometry was ordered. Levaquin was started as well for possible superimposed pneumonia.  Acute renal failure.  His baseline creatinine from 4 1/2 to 5 years ago was within normal limits His renal failure is presumed to be from obstructive uropathy. Creatinine improved significantly today down from 2.27-1.6.  Hyperkalemia.  Resolved. K is 4.8 on 1/14.  Will adjust IVF with urology..  Hypothyroidism. Query noncompliance.  Synthroid dosing has been increased from 75 mcg to 100 mcg daily.  Anemia associated with acute blood loss. No indication for transfusion now.  Hemoglobin continues to be stable. Hematuria appears to have resolved. Continue to monitor hemoglobin    LOS: 6 days   Stephani Police 12/10/2012, 4:54 PM    Attending: The patient was examined and seen by me. He was discussed with PA, Mrs.  Elyn Peers. Agree with the findings, assessment, and plan.   Elliot Cousin, M.D.

## 2012-12-11 ENCOUNTER — Inpatient Hospital Stay (HOSPITAL_COMMUNITY): Payer: Medicare Other

## 2012-12-11 DIAGNOSIS — J9819 Other pulmonary collapse: Secondary | ICD-10-CM

## 2012-12-11 LAB — BASIC METABOLIC PANEL
Calcium: 8.4 mg/dL (ref 8.4–10.5)
GFR calc Af Amer: 40 mL/min — ABNORMAL LOW (ref >90)
GFR calc non Af Amer: 35 mL/min — ABNORMAL LOW (ref >90)
Potassium: 4.3 mEq/L (ref 3.5–5.1)
Sodium: 133 mEq/L — ABNORMAL LOW (ref 135–145)

## 2012-12-11 LAB — CBC
Hemoglobin: 9 g/dL — ABNORMAL LOW (ref 13.0–17.0)
MCHC: 32.8 g/dL (ref 30.0–36.0)
Platelets: 343 10*3/uL (ref 150–400)
RBC: 2.91 MIL/uL — ABNORMAL LOW (ref 4.22–5.81)

## 2012-12-11 LAB — PRO B NATRIURETIC PEPTIDE: Pro B Natriuretic peptide (BNP): 6791 pg/mL — ABNORMAL HIGH (ref 0–450)

## 2012-12-11 MED ORDER — AMLODIPINE BESYLATE 10 MG PO TABS: 10.0000 mg | ORAL_TABLET | Freq: Every day | ORAL | Status: AC

## 2012-12-11 MED ORDER — MUSCLE RUB 10-15 % EX CREA: TOPICAL_CREAM | CUTANEOUS | Status: DC | PRN

## 2012-12-11 MED ORDER — LEVOFLOXACIN 750 MG PO TABS: 750.0000 mg | ORAL_TABLET | ORAL | Status: DC

## 2012-12-11 MED ORDER — ALBUTEROL SULFATE (5 MG/ML) 0.5% IN NEBU: 2.5000 mg | INHALATION_SOLUTION | Freq: Four times a day (QID) | RESPIRATORY_TRACT | Status: DC | PRN

## 2012-12-11 MED ORDER — OXYCODONE HCL 5 MG PO TABS
5.0000 mg | ORAL_TABLET | Freq: Four times a day (QID) | ORAL | Status: DC | PRN
  Administered 2012-12-11 – 2012-12-12 (×2): 5 mg via ORAL
  Filled 2012-12-11 (×2): qty 1

## 2012-12-11 MED ORDER — CLONIDINE HCL 0.1 MG PO TABS: 0.1000 mg | ORAL_TABLET | Freq: Two times a day (BID) | ORAL | Status: DC

## 2012-12-11 MED ORDER — LEVOFLOXACIN 750 MG PO TABS
750.0000 mg | ORAL_TABLET | ORAL | Status: DC
  Administered 2012-12-11: 750 mg via ORAL
  Filled 2012-12-11: qty 1

## 2012-12-11 NOTE — Progress Notes (Signed)
Final path is here. 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

## 2012-12-11 NOTE — Progress Notes (Signed)
Physical Therapy Treatment Patient Details Name: Reginald Waller MRN: [id number] DOB: [birth year] Today's Date: 12/11/2012 Time:  -     PT Assessment / Plan / Recommendation Comments on Treatment Session  Pt refused... stated he was going home today    Follow Up Recommendations        Does the patient have the potential to tolerate intense rehabilitation     Barriers to Discharge        Equipment Recommendations       Recommendations for Other Services    Frequency     Plan      Precautions / Restrictions Restrictions Weight Bearing Restrictions: No   Pertinent Vitals/Pain      Mobility       Exercises     PT Diagnosis:    PT Problem List:   PT Treatment Interventions:     PT Goals    Visit Information  Reason Eval/Treat Not Completed: Other (comment) (Pt refused... stated he was going home today.)    Subjective Data      Cognition       Balance     End of Session     GP     Juel Burrow 12/11/2012, 9:16 AM

## 2012-12-11 NOTE — Progress Notes (Signed)
Afebrile .doing fine  Drugs of Abuse  No results found for this basename: labopia, cocainscrnur, labbenz, amphetmu, thcu, labbarb    Almost clear/. Will d/c foley after he voids satisfactory . He is discharged. i will see in office in one week.. His pathology still pending.

## 2012-12-11 NOTE — Progress Notes (Signed)
Subjective: Patient has no complaints, and he denies any pain.  Objective: Vital signs in last 24 hours: Filed Vitals:   12/11/12 0202 12/11/12 0500 12/11/12 0717 12/11/12 1603  BP: 138/45 151/42    Pulse: 81 87    Temp: 97.7 F (36.5 C) 97.4 F (36.3 C)    TempSrc: Oral Oral    Resp: 20 16    Height:      Weight:  83.5 kg (184 lb 1.4 oz)    SpO2: 98% 97% 97% 94%    Intake/Output Summary (Last 24 hours) at 12/11/12 1653 Last data filed at 12/11/12 1030  Gross per 24 hour  Intake    280 ml  Output    950 ml  Net   -670 ml    Weight change: 1.2 kg (2 lb 10.3 oz)  Physical exam: General: Awake and alert, lying comfortably in bed. Lungs: Clear consultation no wheezes crackles or rales, No accessory muscle movement. Heart: S1, S2, regular rate and rhythm, no obvious murmurs rubs or gallops Abdomen: Slightly distended, active bowel sounds, nontender, no obvious masses Extremities: No pedal edema. Able to move all 4 extremities. Left arm with erythema and slight edema. Neurologic: He is alert and oriented to himself and hospital. Cranial nerves II through XII are grossly intact with exception of decrease in his hearing acuity. GU: Foley catheter reinserted.  Red urine output   Lab Results: Basic Metabolic Panel:  Basename 12/11/12 0404 12/10/12 0547  NA 133* 136  K 4.3 4.5  CL 104 107  CO2 19 20  GLUCOSE 93 92  BUN 31* 30*  CREATININE 1.65* 1.62*  CALCIUM 8.4 8.5  MG -- --  PHOS -- --   CBC:  Basename 12/11/12 0404 12/10/12 0547  WBC 9.2 9.0  NEUTROABS -- --  HGB 9.0* 9.4*  HCT 27.4* 29.0*  MCV 94.2 94.2  PLT 343 358   BNP:  Basename 12/11/12 0404  PROBNP 6791.0*    Misc. Labs:   Micro: Recent Results (from the past 240 hour(s))  URINE CULTURE     Status: Normal   Collection Time   12/04/12  7:59 PM      Component Value Range Status Comment   Specimen Description URINE, CLEAN CATCH   Final    Special Requests NONE   Final    Culture  Setup Time  12/05/2012 06:55   Final    Colony Count NO GROWTH   Final    Culture NO GROWTH   Final    Report Status 12/06/2012 FINAL   Final   MRSA PCR SCREENING     Status: Normal   Collection Time   12/08/12 11:07 AM      Component Value Range Status Comment   MRSA by PCR NEGATIVE  NEGATIVE Final     Studies/Results: US Venous Img Upper Uni Left  12/11/2012  *RADIOLOGY REPORT*  Clinical Data:  Left upper extremity edema, evaluate for DVT  LEFT UPPER EXTREMITY VENOUS DUPLEX ULTRASOUND  Technique:  Gray-scale sonography with graded compression, as well as color Doppler and duplex ultrasound were performed to evaluate the upper extremity deep venous system from the level of the subclavian vein and including the jugular, axillary, basilic and upper cephalic vein.  Spectral Doppler was utilized to evaluate flow at rest and with distal augmentation maneuvers.  Comparison:  None.  Findings:  Normal compressibility of the upper extremity deep veins is demonstrated.  No venous filling defects visualized on grayscale or color Doppler US.  Normal direction of flow is seen throughout the deep veins.  Spectral Doppler waveforms show normal morphology at rest and with distal augmentation.  Subcutaneous edema is noted within the forearm.  IMPRESSION: No evidence of deep venous thrombosis within the left upper extremity.   Original Report Authenticated By: Tacey Ruiz, MD    2-D echocardiogram results - Left ventricle: The cavity size was normal. There was mild to moderate concentric hypertrophy. Systolic function was normal. The estimated ejection fraction was in the range of 60% to 65%. Wall motion was normal; there were no regional wall motion abnormalities. - Mitral valve: Moderately to severely calcified annulus. Mildly thickened, moderately calcified leaflets . - Atrial septum: No defect or patent foramen ovale was identified. - Pericardium, extracardiac: There was a small right pleural effusion. There was a  moderate-sized left pleural effusion.  Medications:  Scheduled:    . albuterol  2.5 mg Nebulization QID  . amLODipine  10 mg Oral Daily  . cloNIDine  0.1 mg Oral BID  . latanoprost  1 drop Both Eyes QHS  . levofloxacin  750 mg Oral Q48H  . levothyroxine  100 mcg Oral QAC breakfast  . loratadine  10 mg Oral Daily  . polyethylene glycol  17 g Oral Daily  . simvastatin  20 mg Oral q1800  . sodium chloride  3 mL Intravenous Q12H  . terazosin  1 mg Oral QHS   Continuous:   ZOX:[id number], hydrALAZINE, hydrALAZINE, HYDROmorphone (DILAUDID) injection, ondansetron (ZOFRAN) IV, sodium chloride, sorbitol, traZODone  Assessment: Active Problems:  Hydronephrosis  Hyperkalemia  UTI (lower urinary tract infection)  Bladder mass  Acute renal failure  Hypertension  Hypothyroidism  SOB (shortness of breath)  Atelectasis of both lungs  Pleural effusion  Pneumonia   Left hydronephrosis.  Dr. Jerre Simon is following.   Bladder mass.  Per Dr. Jerre Simon, Status post cystoscopy 1/13, and TURBT on 1/14.  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   Dr. Jerre Simon will see the patient in his office to explain the diagnosis and arrange for further treatment.  Urinary retention After TURBT, the patient remained on bladder irrigation therapy for approximately 36-48 hours.  The bladder irrigation therapy was discontinued and his Foley was removed.  After 5 hours the patient had not urinated. A bladder scan was done. It indicated greater than 600 cc of urine in the bladder. Foley catheter was reinserted and 300 cc of red urine returned. We will followup with Dr. Jerre Simon regarding this issue.    Left arm cellulitis Unfortunately likely caused by IV infiltration. Doppler ultrasound negative for DVT. Arm erythematous and slightly edematous. Patient on Levaquin. Will elevate the arm and apply cold packs. Supportive therapy   Urinary tract infection.   Urine Culture  negative.  Rocephin previously discontinued.     Elevated BNP, 16,208 on January 13 BNP much improved on January 16 ([id number]) No documented history of heart failure Appears Euvolemic with no outward signs of Heart Failure. No pulmonary edema seen on the chest x-ray, but he does have some pleural effusions.  2-D echo shows normal LVEF, with no wall motion abnormalities. He does have moderate to severely calcified annulus and leaflets.  Malignant hypertension.  Now well controlled.  On amlodipine, prn hydralazine. Question whether or not his Hytrin needs to be resumed at discharge.  Shortness of breath with mild bronchospasms. - Resolved. Possible pneumonia and/or superimposed atelectasis. 1/12 WBC trended down. Albuterol nebulizer was started. Incentive spirometry was ordered. Levaquin was started  as well for possible superimposed pneumonia.  Acute renal failure.  His baseline creatinine from 4 1/2 to 5 years ago was within normal limits His renal failure is presumed to be from obstructive uropathy. Creatinine improved significantly down from 2.27-1.6.  Hyperkalemia.  Resolved. Now stable within normal limits.   Hypothyroidism. Query noncompliance.  Synthroid dosing has been increased from 75 mcg to 100 mcg daily.  Anemia associated with acute blood loss. No indication for transfusion now.  Hemoglobin continues to be stable. Continue to monitor hemoglobin   Disposition: Home with home health RN and PT when he is able to urinate without difficulty.   LOS: 7 days   Conley Canal [phone] 12/11/2012, 4:53 PM   Attending Patient seen and note above reviewed. Agree with replacing Foley and reviewing clinical condition tomorrow.

## 2012-12-11 NOTE — Progress Notes (Signed)
Oxygen sats range between 95-98% at rest. Pt ambulated the hallway and oxygen sats ranged between 88-95%. After returning to room oxygen sats range 88-90% pt is SOB and wheezing after ambulating. Respiratory in the room now administering breathing treatment.

## 2012-12-11 NOTE — Progress Notes (Signed)
Foley catheter removed at 1030 this am. Pt unable to void independently since foley was removed. Algis Downs, [place] made aware. Bladder scan to be done. Bladder scan reads >600cc of urine. Algis Downs, [place] made aware. 16 french Foley catheter reinserted per order. 300 cc red urine returned. Will cont to monitor.

## 2012-12-12 ENCOUNTER — Encounter (HOSPITAL_COMMUNITY): Payer: Self-pay | Admitting: Urology

## 2012-12-12 DIAGNOSIS — D62 Acute posthemorrhagic anemia: Secondary | ICD-10-CM | POA: Diagnosis present

## 2012-12-12 DIAGNOSIS — C679 Malignant neoplasm of bladder, unspecified: Secondary | ICD-10-CM | POA: Diagnosis present

## 2012-12-12 DIAGNOSIS — L03114 Cellulitis of left upper limb: Secondary | ICD-10-CM

## 2012-12-12 LAB — BASIC METABOLIC PANEL
CO2: 22 mEq/L (ref 19–32)
Calcium: 8.5 mg/dL (ref 8.4–10.5)
Chloride: 107 mEq/L (ref 96–112)
GFR calc Af Amer: 37 mL/min — ABNORMAL LOW (ref >90)
Sodium: 136 mEq/L (ref 135–145)

## 2012-12-12 LAB — CBC
MCV: 93.9 fL (ref 78.0–100.0)
Platelets: 395 10*3/uL (ref 150–400)
RBC: 2.96 MIL/uL — ABNORMAL LOW (ref 4.22–5.81)
RDW: 15 % (ref 11.5–15.5)
WBC: 9.3 10*3/uL (ref 4.0–10.5)

## 2012-12-12 MED ORDER — LEVOFLOXACIN 750 MG PO TABS: 750.0000 mg | ORAL_TABLET | ORAL | Status: DC

## 2012-12-12 NOTE — Discharge Summary (Signed)
Physician Discharge Summary  [name]:[id number] DOB: 25-Feb-1919 DOA: 12/04/2012  PCP: Kirk Ruths, MD  Admit date: 12/04/2012 Discharge date: 12/12/2012  Time spent: 60 minutes  Recommendations for Outpatient Follow-up:   Home health RN to assist with Foley care, left arm cellulitis, nebulizer treatments. Home health physical therapy. Appointment with Dr. Jerre Simon on Monday to discuss treatment options for bladder cancer, and to treat urinary retention.  Discharge Diagnoses:  Principal Problem:  *Bladder cancer Active Problems:  Pneumonia  Hydronephrosis  Hyperkalemia  UTI (lower urinary tract infection)  Bladder mass  Acute renal failure  Hypertension  Hypothyroidism  SOB (shortness of breath)  Atelectasis of both lungs  Pleural effusion  Acute blood loss anemia  Left arm cellulitis   Discharge Condition: Stable. Foley in place. Still with gross hematuria.  Diet recommendation: Regular diet  Filed Weights   12/10/12 0500 12/11/12 0500 12/12/12 0500  Weight: 82.3 kg (181 lb 7 oz) 83.5 kg (184 lb 1.4 oz) 83 kg (182 lb 15.7 oz)    History of present illness:  Reginald Waller is an 79 y.o. male. [employer] War [employer] veteran, having abdominal back pain and constipation for the past few weeks, got much worse last night and came to the emergency room today to be evaluated. Developed severe pain while in the emergency room and a CT scan was done which showed a bladder mass likely compressing the ureter, and causing the observed hydronephrosis.  The patient was discussed between emergency room physician and Dr. Jerre Simon and plans have been made for admission for evaluation and management by Dr. Jerre Simon tomorrow.  Prior to his recent troubles this gentleman considered himself to be in very good health.   Hospital Course:   Bladder mass  causing urinary retention and left hydronephrosis. Reginald Waller was admitted to a telemetry bed. Urology was consulted. Dr. Jerre Simon performed  cystoscopy on January 13, and TURBT on 1/14. 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 After TURBT, the patient remained on bladder irrigation therapy for approximately 36-48 hours. The bladder irrigation therapy was discontinued and his Foley was removed. After 5 hours the patient had not urinated. A bladder scan was done. It indicated greater than 600 cc of urine in the bladder. Foley catheter was reinserted and 300 cc of red urine returned.  At this point the patient is stable for discharge to home with Foley in place.  Dr. Jerre Simon will see the patient in his office to further explain the diagnosis and arrange for further treatment.   Left arm cellulitis  Approximately 2 days into his hospitalization Reginald Waller developed mild pain with erythema and mild edema in his left arm. The erythema extends from the area of his bicep down towards his wrist. Unfortunately likely initiated by IV infiltration. Doppler ultrasound negative for DVT in his left upper extremity.  Patient on Levaquin.  We have recommended keeping the arm elevated and applying cold packs.  Urinary tract infection.  Urine Culture negative. Rocephin previously discontinued.   Elevated BNP, 16,208 on January 13  BNP much improved on January 16 ([id number])  No documented history of heart failure.  Appears Euvolemic with no outward signs of Heart Failure.  No pulmonary edema seen on the chest x-ray, but he does have some pleural effusions. 2-D echo done during this hospitalization shows normal LVEF, with no wall motion abnormalities. He does have moderate to severely calcified annulus and leaflets.   Malignant hypertension.  Now well controlled. On amlodipine and  Hytrin.  Shortness of breath with mild bronchospasms. - Resolved.  Possible pneumonia and/or superimposed atelectasis.  On 1/12 Reginald Waller developed difficulty with shortness of breath. He was treated with albuterol nebulizer and incentive  spirometry.  Levaquin was initiated for possible superimposed pneumonia. His WBC trended down. He is no longer short of breath.  He will be discharged home with by mouth Levaquin and nebulizer treatments.   Acute renal failure.  His baseline creatinine from 2009 was between 1.6 and 1.8. On admission he had developed acute renal failure from obstructive uropathy.  Since his procedure, and Foley placement, his creatinine has improved significantly. It is down from 2.27 to his baseline.   Hyperkalemia.  Resolved. Now stable within normal limits.   Hypothyroidism.  Query noncompliance.  Synthroid dosing has been increased from 75 mcg to 100 mcg daily. We request his primary care physician to check a TSH in 6 weeks which would be approximately March 1.  Anemia associated with acute blood loss.  No indication for transfusion now. Hemoglobin continues to be stable at approximately 9.    Procedures:  Cystoscopy January 13  TURBT January 14  Consultations: Urology  Discharge Exam: Reginald Waller Vitals:   12/12/12 1610 12/12/12 0914 12/12/12 1042 12/12/12 1133  BP:  127/47    Pulse:  95    Temp:  97.8 F (36.6 C)    TempSrc:  Oral    Resp:  20    Height:      Weight:      SpO2: 93% 100% 98% 94%    General: Alert and oriented, hard of hearing, smiling, in no apparent distress Cardiovascular: regular rate and rhythm, no murmurs rubs or gallops Respiratory: clear to auscultation no wheezes crackles or rales abdomen: Slight distention, nontender, soft, positive bowel sounds, no masses Extremities: Able to move all 4, no edema GU: Foley catheter in place with burgundy-colored transparent urine in the Foley bag The patient has been walking the hallways with the nurses. His oxygen saturations have remained at 90 or above.  Discharge Instructions      Discharge Orders    Future Orders Please Complete By Expires   Diet - Waller sodium heart healthy      Diet - Waller sodium heart healthy       Increase activity slowly      Increase activity slowly          Medication List     As of 12/12/2012 12:14 PM    STOP taking these medications         CORICIDIN D 2-30-325 MG Tabs   Generic drug: Chlorphen-Pseudoephed-APAP      terazosin 1 MG capsule   Commonly known as: HYTRIN      TAKE these medications         albuterol (5 MG/ML) 0.5% nebulizer solution   Commonly known as: PROVENTIL   Take 0.5 mLs (2.5 mg total) by nebulization every 6 (six) hours as needed for wheezing or shortness of breath.      amLODipine 10 MG tablet   Commonly known as: NORVASC   Take 1 tablet (10 mg total) by mouth daily.      ARTHRITIS PAIN RELIEVER 650 MG CR tablet   Generic drug: acetaminophen   Take 650 mg by mouth daily as needed. For pain      cloNIDine 0.1 MG tablet   Commonly known as: CATAPRES   Take 1 tablet (0.1 mg total) by mouth 2 (two) times daily.  ferrous sulfate 325 (65 FE) MG tablet   Take 325 mg by mouth every morning.      latanoprost 0.005 % ophthalmic solution   Commonly known as: XALATAN   Place 1 drop into both eyes at bedtime.      levofloxacin 750 MG tablet   Commonly known as: LEVAQUIN   Take 1 tablet (750 mg total) by mouth every other day. Take 1 tablet Friday 1/17, then Sunday 1/18, then Tuesday 1/20.      levothyroxine 75 MCG tablet   Commonly known as: SYNTHROID, LEVOTHROID   Take 75 mcg by mouth every morning.      phenylephrine 0.5 % nasal solution   Commonly known as: NEO-SYNEPHRINE   Place 1 drop into the nose every 4 (four) hours as needed. Nasal congestion      polyethylene glycol powder powder   Commonly known as: GLYCOLAX/MIRALAX   Take 17 g by mouth daily.      simvastatin 20 MG tablet   Commonly known as: ZOCOR   Take 20 mg by mouth every morning.         Follow-up Information    Follow up with Ky Barban, MD. (Follow up with MD 1/20 at 1:15)    Contact information:   [address] [place]  [id number] [phone]       Follow up with Kirk Ruths, MD. Schedule an appointment as soon as possible for a visit on 12/19/2012. (Follow up with MD Jan-24th at 10;00)    Contact information:   [address] [place] [address] [phone]           The results of significant diagnostics from this hospitalization (including imaging, microbiology, ancillary and laboratory) are listed below for reference.    Significant Diagnostic Studies: Ct Abdomen Pelvis Wo Contrast  12/04/2012  *RADIOLOGY REPORT*  Clinical Data: Left lower quadrant and left flank pain.  Waller back pain.  CT ABDOMEN AND PELVIS WITHOUT CONTRAST  Technique:  Multidetector CT imaging of the abdomen and pelvis was performed following the standard protocol without intravenous contrast.  Comparison: 12/11/2004.  Findings: Lung Bases: Small bilateral pleural effusions.  Basilar atelectasis.  Coronary artery atherosclerosis is present.  Severe mitral annular calcification.  Liver:  Unenhanced CT was performed per clinician order.  Lack of IV contrast limits sensitivity and specificity, especially for evaluation of abdominal/pelvic solid viscera.  Old granulomatous calcifications.  Spleen:  Old granulomatous disease.  Gallbladder:  Dependently layering calcified gallstones.  Common bile duct:  Within normal limits for age.  Pancreas:  Stable prominence of the pancreatic duct.  Scattered pancreatic calcifications, likely associated with prior pancreatitis.  Adrenal glands:  Stable thickening of the left adrenal gland, likely representing adenoma.  Kidneys:  Severe left hydronephrosis.  Left hydroureter is present. Vascular calcifications are present in the kidneys.  No collecting system calculi.  No ureteral calculi.  The left hydroureter extends to the urinary bladder where there is mural thickening on the left side of the trigone, suspicious for an obstructing neoplasm.  Right kidney and ureter appear within  normal limits.  Stomach:  Grossly normal.  Decompressed.  Small bowel:  Normal.  Colon:   High density along the cecum compatible with prior appendectomy.  Colonic diverticulosis without diverticulitis. There is thickening of the rectum.  Follow-up colonoscopy recommended.  Pelvic Genitourinary:  High attenuation of the left side of the bladder trigone as described above.  No free fluid.  Prostate gland appears  within normal limits.  No inguinal adenopathy.  No pelvic sidewall adenopathy.  Bones:  Lumbar spondylosis with anterolisthesis of L4 on L5.  Old T11 compression fracture.  Vasculature: Severe atherosclerosis without aneurysm.  IMPRESSION: 1.  Severe left hydroureteronephrosis extending to the left UVJ. There is soft tissue attenuation in the left side of the bladder trigone, raising the possibility of neoplasm producing obstruction of the distal left ureter. No ureteral calculi are seen. Cystoscopy recommended. 2.  Thickening of the rectum is nonspecific.  Follow-up endoscopy is recommended. 3.  Small bilateral pleural effusions and basilar atelectasis. 4.  Cholelithiasis without CT evidence of cholecystitis. 5.  Atherosclerosis and coronary artery disease.   Original Report Authenticated By: Andreas Newport, M.D.    Dg Chest 2 View  12/04/2012  *RADIOLOGY REPORT*  Clinical Data: Chronic cough  CHEST - 2 VIEW  Comparison: 05/13/2008  Findings: The heart and pulmonary vascularity are within normal limits.  Some atelectasis is seen in the left lung base. Hyperinflation consistent with COPD is noted.  No acute bony abnormality is seen.  IMPRESSION: Atelectasis versus scarring in the left lung base.   Original Report Authenticated By: Alcide Clever, M.D.    Dg Hip Complete Left  12/04/2012  *RADIOLOGY REPORT*  Clinical Data: Hip pain  LEFT HIP - COMPLETE 2+ VIEW  Comparison: None.  Findings: Four views of the left hip submitted.  No acute fracture or subluxation.  Mild degenerative changes noted bilateral hip  joint with marginal acetabular spurring. Mild atherosclerotic calcifications of femoral arteries. Pelvic phleboliths are noted. Mild degenerative changes bilateral inferior aspect SI joints.  IMPRESSION: No acute fracture or subluxation.  Mild degenerative changes bilateral hip joints.   Original Report Authenticated By: Natasha Mead, M.D.    US Venous Img Upper Uni Left  12/11/2012  *RADIOLOGY REPORT*  Clinical Data:  Left upper extremity edema, evaluate for DVT  LEFT UPPER EXTREMITY VENOUS DUPLEX ULTRASOUND  Technique:  Gray-scale sonography with graded compression, as well as color Doppler and duplex ultrasound were performed to evaluate the upper extremity deep venous system from the level of the subclavian vein and including the jugular, axillary, basilic and upper cephalic vein.  Spectral Doppler was utilized to evaluate flow at rest and with distal augmentation maneuvers.  Comparison:  None.  Findings:  Normal compressibility of the upper extremity deep veins is demonstrated.  No venous filling defects visualized on grayscale or color Doppler US.  Normal direction of flow is seen throughout the deep veins.  Spectral Doppler waveforms show normal morphology at rest and with distal augmentation.  Subcutaneous edema is noted within the forearm.  IMPRESSION: No evidence of deep venous thrombosis within the left upper extremity.   Original Report Authenticated By: Tacey Ruiz, MD    Dg Chest Port 1 View  12/07/2012  *RADIOLOGY REPORT*  Clinical Data: Shortness of breath and wheezing.  PORTABLE CHEST - 1 VIEW  Comparison: Chest x-ray 12/04/2012.  Findings: Increasing bibasilar opacities may reflect worsening areas of atelectasis and/or consolidation, with increasing small bilateral pleural effusions (left greater than right).  Pulmonary vasculature is normal.  Heart size is within normal limits. Atherosclerosis in the thoracic aorta.  IMPRESSION: 1.  Worsening bibasilar aeration may reflect increasing areas of  atelectasis and/or consolidation in the lower lobes of the lungs bilaterally (left greater than right), with increasing small bilateral pleural effusions (left greater than right). 2.  Atherosclerosis.   Original Report Authenticated By: Trudie Reed, M.D.     Microbiology: Recent  Results (from the past 240 hour(s))  URINE CULTURE     Status: Normal   Collection Time   12/04/12  7:59 PM      Component Value Range Status Comment   Specimen Description URINE, CLEAN CATCH   Final    Special Requests NONE   Final    Culture  Setup Time 12/05/2012 06:55   Final    Colony Count NO GROWTH   Final    Culture NO GROWTH   Final    Report Status 12/06/2012 FINAL   Final   MRSA PCR SCREENING     Status: Normal   Collection Time   12/08/12 11:07 AM      Component Value Range Status Comment   MRSA by PCR NEGATIVE  NEGATIVE Final      Labs: Basic Metabolic Panel:  Lab 12/12/12 5621 12/11/12 0404 12/10/12 0547 12/09/12 0602 12/08/12 0534  NA 136 133* 136 139 140  K 4.6 4.3 4.5 4.8 4.5  CL 107 104 107 109 111  CO2 22 19 20 19 20   GLUCOSE 89 93 92 97 92  BUN 31* 31* 30* 32* 32*  CREATININE 1.76* 1.65* 1.62* 2.27* 2.26*  CALCIUM 8.5 8.4 8.5 8.5 8.6  MG -- -- -- -- --  PHOS -- -- -- -- --   Liver Function Tests:  Lab 12/06/12 0654  AST 22  ALT 10  ALKPHOS 85  BILITOT 0.5  PROT 5.6*  ALBUMIN 2.7*   CBC:  Lab 12/12/12 0406 12/11/12 0404 12/10/12 0547 12/09/12 0602 12/08/12 0534  WBC 9.3 9.2 9.0 9.4 9.9  NEUTROABS -- -- -- -- --  HGB 9.1* 9.0* 9.4* 9.4* 9.0*  HCT 27.8* 27.4* 29.0* 29.1* 28.3*  MCV 93.9 94.2 94.2 95.4 95.9  PLT 395 343 358 328 318     BNP: BNP (last 3 results)  Basename 12/11/12 0404 12/08/12 0534  PROBNP 6791.0* 30865.7*    Signed:  Conley Canal [phone]  [facility] 12/12/2012, 12:14 PM  Attending Patient seen and  above note reviewed. Agree with above,patient stable for discharge.

## 2012-12-12 NOTE — Progress Notes (Signed)
IV removed, site WNL.  Pt given d/c instructions and new prescriptions.  Discussed home care (foley care, switching to leg bad and large bag, hand hygiene, etc.) with patient and his wife and discussed home medications, patient verbalizes understanding, teachback completed. Wife able to demonstrate care of leg bag and switching bags. F/U appointment in place with dr Paulino Door and dr Regino Schultze, pt states they will keep appointment. Pt is stable at this time. Pt taken to main entrance in wheelchair by staff member.

## 2012-12-12 NOTE — Progress Notes (Signed)
Spoke with Dr. Jerre Simon, he is aware pt is being d/ced with foley in place.

## 2012-12-13 ENCOUNTER — Emergency Department (HOSPITAL_COMMUNITY): Payer: Medicare Other

## 2012-12-13 ENCOUNTER — Inpatient Hospital Stay (HOSPITAL_COMMUNITY)
Admission: EM | Admit: 2012-12-13 | Discharge: 2012-12-19 | DRG: 291 | Disposition: A | Discharge status: Skilled Nursing Facility | Payer: Medicare Other | Attending: Internal Medicine | Admitting: Internal Medicine

## 2012-12-13 ENCOUNTER — Encounter (HOSPITAL_COMMUNITY): Payer: Self-pay | Admitting: Emergency Medicine

## 2012-12-13 DIAGNOSIS — I129 Hypertensive chronic kidney disease with stage 1 through stage 4 chronic kidney disease, or unspecified chronic kidney disease: Secondary | ICD-10-CM | POA: Diagnosis present

## 2012-12-13 DIAGNOSIS — D649 Anemia, unspecified: Secondary | ICD-10-CM

## 2012-12-13 DIAGNOSIS — E860 Dehydration: Secondary | ICD-10-CM | POA: Diagnosis not present

## 2012-12-13 DIAGNOSIS — R0602 Shortness of breath: Secondary | ICD-10-CM

## 2012-12-13 DIAGNOSIS — Z8249 Family history of ischemic heart disease and other diseases of the circulatory system: Secondary | ICD-10-CM

## 2012-12-13 DIAGNOSIS — Y846 Urinary catheterization as the cause of abnormal reaction of the patient, or of later complication, without mention of misadventure at the time of the procedure: Secondary | ICD-10-CM | POA: Diagnosis not present

## 2012-12-13 DIAGNOSIS — R5381 Other malaise: Secondary | ICD-10-CM | POA: Diagnosis present

## 2012-12-13 DIAGNOSIS — N390 Urinary tract infection, site not specified: Secondary | ICD-10-CM

## 2012-12-13 DIAGNOSIS — Z79899 Other long term (current) drug therapy: Secondary | ICD-10-CM

## 2012-12-13 DIAGNOSIS — C679 Malignant neoplasm of bladder, unspecified: Secondary | ICD-10-CM | POA: Diagnosis present

## 2012-12-13 DIAGNOSIS — L03114 Cellulitis of left upper limb: Secondary | ICD-10-CM | POA: Diagnosis present

## 2012-12-13 DIAGNOSIS — N3289 Other specified disorders of bladder: Secondary | ICD-10-CM

## 2012-12-13 DIAGNOSIS — J90 Pleural effusion, not elsewhere classified: Secondary | ICD-10-CM

## 2012-12-13 DIAGNOSIS — D72829 Elevated white blood cell count, unspecified: Secondary | ICD-10-CM | POA: Diagnosis not present

## 2012-12-13 DIAGNOSIS — D62 Acute posthemorrhagic anemia: Secondary | ICD-10-CM | POA: Diagnosis present

## 2012-12-13 DIAGNOSIS — M129 Arthropathy, unspecified: Secondary | ICD-10-CM | POA: Diagnosis present

## 2012-12-13 DIAGNOSIS — N1330 Unspecified hydronephrosis: Secondary | ICD-10-CM

## 2012-12-13 DIAGNOSIS — S3730XA Unspecified injury of urethra, initial encounter: Secondary | ICD-10-CM | POA: Diagnosis not present

## 2012-12-13 DIAGNOSIS — F411 Generalized anxiety disorder: Secondary | ICD-10-CM | POA: Diagnosis present

## 2012-12-13 DIAGNOSIS — S3720XA Unspecified injury of bladder, initial encounter: Secondary | ICD-10-CM | POA: Diagnosis not present

## 2012-12-13 DIAGNOSIS — M25559 Pain in unspecified hip: Secondary | ICD-10-CM | POA: Diagnosis not present

## 2012-12-13 DIAGNOSIS — R799 Abnormal finding of blood chemistry, unspecified: Secondary | ICD-10-CM | POA: Diagnosis not present

## 2012-12-13 DIAGNOSIS — J9811 Atelectasis: Secondary | ICD-10-CM | POA: Diagnosis present

## 2012-12-13 DIAGNOSIS — Z9089 Acquired absence of other organs: Secondary | ICD-10-CM

## 2012-12-13 DIAGNOSIS — Z87891 Personal history of nicotine dependence: Secondary | ICD-10-CM

## 2012-12-13 DIAGNOSIS — I639 Cerebral infarction, unspecified: Secondary | ICD-10-CM

## 2012-12-13 DIAGNOSIS — I5033 Acute on chronic diastolic (congestive) heart failure: Secondary | ICD-10-CM

## 2012-12-13 DIAGNOSIS — E039 Hypothyroidism, unspecified: Secondary | ICD-10-CM

## 2012-12-13 DIAGNOSIS — R1319 Other dysphagia: Secondary | ICD-10-CM | POA: Diagnosis not present

## 2012-12-13 DIAGNOSIS — Z66 Do not resuscitate: Secondary | ICD-10-CM | POA: Diagnosis not present

## 2012-12-13 DIAGNOSIS — I5031 Acute diastolic (congestive) heart failure: Secondary | ICD-10-CM

## 2012-12-13 DIAGNOSIS — Z8673 Personal history of transient ischemic attack (TIA), and cerebral infarction without residual deficits: Secondary | ICD-10-CM

## 2012-12-13 DIAGNOSIS — N179 Acute kidney failure, unspecified: Secondary | ICD-10-CM | POA: Diagnosis present

## 2012-12-13 DIAGNOSIS — I6350 Cerebral infarction due to unspecified occlusion or stenosis of unspecified cerebral artery: Secondary | ICD-10-CM | POA: Diagnosis not present

## 2012-12-13 DIAGNOSIS — N189 Chronic kidney disease, unspecified: Secondary | ICD-10-CM | POA: Diagnosis present

## 2012-12-13 DIAGNOSIS — I10 Essential (primary) hypertension: Secondary | ICD-10-CM

## 2012-12-13 DIAGNOSIS — I509 Heart failure, unspecified: Secondary | ICD-10-CM | POA: Diagnosis present

## 2012-12-13 DIAGNOSIS — IMO0002 Reserved for concepts with insufficient information to code with codable children: Secondary | ICD-10-CM | POA: Diagnosis not present

## 2012-12-13 DIAGNOSIS — J189 Pneumonia, unspecified organism: Secondary | ICD-10-CM | POA: Diagnosis present

## 2012-12-13 DIAGNOSIS — E875 Hyperkalemia: Secondary | ICD-10-CM

## 2012-12-13 DIAGNOSIS — J9819 Other pulmonary collapse: Secondary | ICD-10-CM | POA: Diagnosis present

## 2012-12-13 DIAGNOSIS — R319 Hematuria, unspecified: Secondary | ICD-10-CM | POA: Diagnosis not present

## 2012-12-13 DIAGNOSIS — J449 Chronic obstructive pulmonary disease, unspecified: Secondary | ICD-10-CM

## 2012-12-13 DIAGNOSIS — Z792 Long term (current) use of antibiotics: Secondary | ICD-10-CM

## 2012-12-13 HISTORY — DX: Malignant neoplasm of urinary organ, unspecified: C68.9

## 2012-12-13 LAB — BASIC METABOLIC PANEL
BUN: 33 mg/dL — ABNORMAL HIGH (ref 6–23)
Calcium: 8.5 mg/dL (ref 8.4–10.5)
Chloride: 107 mEq/L (ref 96–112)
Creatinine, Ser: 1.92 mg/dL — ABNORMAL HIGH (ref 0.50–1.35)
GFR calc Af Amer: 33 mL/min — ABNORMAL LOW (ref >90)

## 2012-12-13 LAB — BLOOD GAS, ARTERIAL
Acid-base deficit: 5 mmol/L — ABNORMAL HIGH (ref 0.0–2.0)
O2 Content: 4 L/min
O2 Saturation: 95.4 %
TCO2: 17.2 mmol/L (ref 0–100)
pCO2 arterial: 28.5 mmHg — ABNORMAL LOW (ref 35.0–45.0)
pO2, Arterial: 71.6 mmHg — ABNORMAL LOW (ref 80.0–100.0)

## 2012-12-13 LAB — CBC WITH DIFFERENTIAL/PLATELET
Basophils Absolute: 0.1 10*3/uL (ref 0.0–0.1)
Basophils Relative: 1 % (ref 0–1)
Eosinophils Relative: 3 % (ref 0–5)
HCT: 27.2 % — ABNORMAL LOW (ref 39.0–52.0)
Hemoglobin: 8.9 g/dL — ABNORMAL LOW (ref 13.0–17.0)
MCH: 30.6 pg (ref 26.0–34.0)
MCHC: 32.7 g/dL (ref 30.0–36.0)
MCV: 93.5 fL (ref 78.0–100.0)
Monocytes Absolute: 1.4 10*3/uL — ABNORMAL HIGH (ref 0.1–1.0)
Monocytes Relative: 13 % — ABNORMAL HIGH (ref 3–12)
Neutro Abs: 8 10*3/uL — ABNORMAL HIGH (ref 1.7–7.7)
RDW: 15 % (ref 11.5–15.5)

## 2012-12-13 MED ORDER — LEVOFLOXACIN 500 MG PO TABS
ORAL_TABLET | ORAL | Status: AC
  Administered 2012-12-13: 750 mg via ORAL
  Filled 2012-12-13: qty 1

## 2012-12-13 MED ORDER — FERROUS SULFATE 325 (65 FE) MG PO TABS
325.0000 mg | ORAL_TABLET | Freq: Every morning | ORAL | Status: DC
  Administered 2012-12-13 – 2012-12-15 (×3): 325 mg via ORAL
  Filled 2012-12-13 (×3): qty 1

## 2012-12-13 MED ORDER — SODIUM CHLORIDE 0.9 % IV SOLN
INTRAVENOUS | Status: DC
  Administered 2012-12-13 (×2): via INTRAVENOUS

## 2012-12-13 MED ORDER — LEVOTHYROXINE SODIUM 75 MCG PO TABS
75.0000 ug | ORAL_TABLET | Freq: Every day | ORAL | Status: DC
  Administered 2012-12-13 – 2012-12-19 (×7): 75 ug via ORAL
  Filled 2012-12-13 (×7): qty 1

## 2012-12-13 MED ORDER — LEVOFLOXACIN 250 MG PO TABS
ORAL_TABLET | ORAL | Status: AC
  Filled 2012-12-13: qty 1

## 2012-12-13 MED ORDER — CLONIDINE HCL 0.1 MG PO TABS
0.1000 mg | ORAL_TABLET | Freq: Two times a day (BID) | ORAL | Status: DC
  Administered 2012-12-13 – 2012-12-15 (×6): 0.1 mg via ORAL
  Filled 2012-12-13 (×6): qty 1

## 2012-12-13 MED ORDER — LATANOPROST 0.005 % OP SOLN
1.0000 [drp] | Freq: Every day | OPHTHALMIC | Status: DC
  Administered 2012-12-13 – 2012-12-18 (×4): 1 [drp] via OPHTHALMIC
  Filled 2012-12-13: qty 2.5

## 2012-12-13 MED ORDER — POLYETHYLENE GLYCOL 3350 17 G PO PACK
17.0000 g | PACK | Freq: Every day | ORAL | Status: DC
  Administered 2012-12-13 – 2012-12-15 (×3): 17 g via ORAL
  Filled 2012-12-13 (×3): qty 1

## 2012-12-13 MED ORDER — ONDANSETRON HCL 4 MG PO TABS: 4.0000 mg | ORAL_TABLET | Freq: Four times a day (QID) | ORAL | Status: DC | PRN

## 2012-12-13 MED ORDER — HEPARIN SODIUM (PORCINE) 5000 UNIT/ML IJ SOLN
5000.0000 [IU] | Freq: Three times a day (TID) | INTRAMUSCULAR | Status: DC
  Administered 2012-12-13 – 2012-12-18 (×16): 5000 [IU] via SUBCUTANEOUS
  Filled 2012-12-13 (×12): qty 1
  Filled 2012-12-13: qty 2
  Filled 2012-12-13 (×2): qty 1

## 2012-12-13 MED ORDER — ACETAMINOPHEN 325 MG PO TABS
650.0000 mg | ORAL_TABLET | Freq: Every day | ORAL | Status: DC | PRN
  Administered 2012-12-13 – 2012-12-17 (×6): 650 mg via ORAL
  Filled 2012-12-13 (×6): qty 2

## 2012-12-13 MED ORDER — IPRATROPIUM BROMIDE 0.02 % IN SOLN
RESPIRATORY_TRACT | Status: AC
  Administered 2012-12-13: 0.5 mg
  Filled 2012-12-13: qty 2.5

## 2012-12-13 MED ORDER — ALBUTEROL SULFATE (5 MG/ML) 0.5% IN NEBU
INHALATION_SOLUTION | RESPIRATORY_TRACT | Status: AC
  Administered 2012-12-13: 5 mg
  Filled 2012-12-13: qty 1

## 2012-12-13 MED ORDER — SODIUM CHLORIDE 0.9 % IV SOLN: INTRAVENOUS | Status: AC

## 2012-12-13 MED ORDER — ALBUTEROL SULFATE (5 MG/ML) 0.5% IN NEBU
2.5000 mg | INHALATION_SOLUTION | Freq: Four times a day (QID) | RESPIRATORY_TRACT | Status: DC | PRN
  Administered 2012-12-13: 2.5 mg via RESPIRATORY_TRACT

## 2012-12-13 MED ORDER — ALBUTEROL SULFATE (5 MG/ML) 0.5% IN NEBU
5.0000 mg | INHALATION_SOLUTION | Freq: Once | RESPIRATORY_TRACT | Status: AC
  Administered 2012-12-13: 5 mg via RESPIRATORY_TRACT
  Filled 2012-12-13: qty 1

## 2012-12-13 MED ORDER — LORAZEPAM 0.5 MG PO TABS
0.5000 mg | ORAL_TABLET | Freq: Four times a day (QID) | ORAL | Status: DC | PRN
  Administered 2012-12-13 – 2012-12-17 (×6): 0.5 mg via ORAL
  Filled 2012-12-13 (×7): qty 1

## 2012-12-13 MED ORDER — ALBUTEROL SULFATE (5 MG/ML) 0.5% IN NEBU: 2.5000 mg | INHALATION_SOLUTION | RESPIRATORY_TRACT | Status: DC | PRN

## 2012-12-13 MED ORDER — PHENYLEPHRINE HCL 0.5 % NA SOLN
1.0000 [drp] | NASAL | Status: DC | PRN
  Administered 2012-12-15: 1 [drp] via NASAL
  Filled 2012-12-13: qty 15

## 2012-12-13 MED ORDER — BIOTENE DRY MOUTH MT LIQD
15.0000 mL | Freq: Two times a day (BID) | OROMUCOSAL | Status: DC
  Administered 2012-12-13 – 2012-12-19 (×12): 15 mL via OROMUCOSAL

## 2012-12-13 MED ORDER — AMLODIPINE BESYLATE 5 MG PO TABS
10.0000 mg | ORAL_TABLET | Freq: Every day | ORAL | Status: DC
  Administered 2012-12-13 – 2012-12-15 (×3): 10 mg via ORAL
  Filled 2012-12-13 (×3): qty 2

## 2012-12-13 MED ORDER — ONDANSETRON HCL 4 MG/2ML IJ SOLN: 4.0000 mg | Freq: Four times a day (QID) | INTRAMUSCULAR | Status: DC | PRN

## 2012-12-13 MED ORDER — ALBUTEROL SULFATE (5 MG/ML) 0.5% IN NEBU
2.5000 mg | INHALATION_SOLUTION | Freq: Four times a day (QID) | RESPIRATORY_TRACT | Status: DC
  Administered 2012-12-13 – 2012-12-15 (×8): 2.5 mg via RESPIRATORY_TRACT
  Filled 2012-12-13 (×6): qty 0.5

## 2012-12-13 MED ORDER — IPRATROPIUM BROMIDE 0.02 % IN SOLN
0.5000 mg | Freq: Once | RESPIRATORY_TRACT | Status: AC
  Administered 2012-12-13: 0.5 mg via RESPIRATORY_TRACT
  Filled 2012-12-13: qty 2.5

## 2012-12-13 MED ORDER — LEVOFLOXACIN 750 MG PO TABS
750.0000 mg | ORAL_TABLET | ORAL | Status: DC
  Administered 2012-12-13: 750 mg via ORAL

## 2012-12-13 MED ORDER — SIMVASTATIN 20 MG PO TABS
20.0000 mg | ORAL_TABLET | Freq: Every morning | ORAL | Status: DC
  Administered 2012-12-13 – 2012-12-15 (×3): 20 mg via ORAL
  Filled 2012-12-13 (×3): qty 1

## 2012-12-13 MED ORDER — METHYLPREDNISOLONE SODIUM SUCC 125 MG IJ SOLR
125.0000 mg | Freq: Once | INTRAMUSCULAR | Status: AC
  Administered 2012-12-13: 125 mg via INTRAVENOUS
  Filled 2012-12-13: qty 2

## 2012-12-13 NOTE — ED Provider Notes (Addendum)
History     CSN: [id number]  Arrival date & time 12/13/12  5784   First MD Initiated Contact with Patient 12/13/12 [phone]      Chief Complaint  Patient presents with  . Shortness of Breath    (Consider location/radiation/quality/duration/timing/severity/associated sxs/prior treatment) HPI Reginald Waller is a 79 y.o. male  With a h/o COPD, HTN, arthritis, recent TURP with resection of bladder tumor found to be CA 12/08/12, discharged from the hospital yesterday brought in by ambulance,  to the Emergency Department complaining of worsening shortness of breath when he woke to go to the bathroom. He used his nebulizer without relief. He was unable to cough up any phlegm. He was to have O2 delivered to the home today. Denies fever, chills, chest pain. He has an indwelling Foley s/p TURP.  PCP Dr. Regino Schultze Urology Dr. Jerre Simon Past Medical History  Diagnosis Date  . Hypertension   . Arthritis   . Stroke   . Malaria     Past Surgical History  Procedure Date  . Hernia repair   . Cataract extraction   . Appendectomy   . Eye surgery   . Cystoscopy 12/08/2012    Procedure: CYSTOSCOPY FLEXIBLE;  Surgeon: Ky Barban, MD;  Location: [facility];  Service: Urology;  Laterality: N/A;  . Transurethral resection of bladder tumor 12/09/2012    Procedure: TRANSURETHRAL RESECTION OF BLADDER TUMOR (TURBT);  Surgeon: Ky Barban, MD;  Location: [facility];  Service: Urology;  Laterality: N/A;    Family History  Problem Relation Age of Onset  . Diabetes Mother   . Diabetes Father   . Cancer Sister     breast  . Cancer Sister     thyroid  . Cancer Brother     leukemia  . Cancer Brother   . Coronary artery disease Brother     History  Substance Use Topics  . Smoking status: Never Smoker   . Smokeless tobacco: Not on file  . Alcohol Use: No      Review of Systems  Constitutional: Negative for fever.       10 Systems reviewed and are negative for acute change except as noted in the  HPI.  HENT: Negative for congestion.   Eyes: Negative for discharge and redness.  Respiratory: Positive for cough and shortness of breath.   Cardiovascular: Negative for chest pain.  Gastrointestinal: Negative for vomiting and abdominal pain.  Musculoskeletal: Negative for back pain.  Skin: Negative for rash.  Neurological: Negative for syncope, numbness and headaches.  Psychiatric/Behavioral:       No behavior change.    Allergies  Shellfish allergy  Home Medications   Current Outpatient Rx  Name  Route  Sig  Dispense  Refill  . ACETAMINOPHEN ER 650 MG PO TBCR   Oral   Take 650 mg by mouth daily as needed. For pain         . ALBUTEROL SULFATE (5 MG/ML) 0.5% IN NEBU   Nebulization   Take 0.5 mLs (2.5 mg total) by nebulization every 6 (six) hours as needed for wheezing or shortness of breath.   20 mL   5   . AMLODIPINE BESYLATE 10 MG PO TABS   Oral   Take 1 tablet (10 mg total) by mouth daily.   30 tablet   0   . CLONIDINE HCL 0.1 MG PO TABS   Oral   Take 1 tablet (0.1 mg total) by mouth 2 (two) times daily.  60 tablet   0   . FERROUS SULFATE 325 (65 FE) MG PO TABS   Oral   Take 325 mg by mouth every morning.         Marland Kitchen LATANOPROST 0.005 % OP SOLN   Both Eyes   Place 1 drop into both eyes at bedtime.         Marland Kitchen LEVOFLOXACIN 750 MG PO TABS   Oral   Take 1 tablet (750 mg total) by mouth every other day. Take 1 tablet Sunday 1/19, then 1 tablet Tuesday 1/21   2 tablet   0   . LEVOTHYROXINE SODIUM 75 MCG PO TABS   Oral   Take 75 mcg by mouth every morning.          Marland Kitchen PHENYLEPHRINE HCL 0.5 % NA SOLN   Nasal   Place 1 drop into the nose every 4 (four) hours as needed. Nasal congestion         . POLYETHYLENE GLYCOL 3350 PO POWD   Oral   Take 17 g by mouth daily.   255 g   0   . SIMVASTATIN 20 MG PO TABS   Oral   Take 20 mg by mouth every morning.            BP 130/87  Pulse 98  Temp 98.5 F (36.9 C) (Oral)  Resp 29  SpO2  98%  Physical Exam  Nursing note and vitals reviewed. Constitutional:       Awake, alert, nontoxic appearance.  HENT:  Head: Atraumatic.  Right Ear: External ear normal.  Left Ear: External ear normal.  Mouth/Throat: Oropharynx is clear and moist.       Poor dentition  Eyes: Right eye exhibits no discharge. Left eye exhibits no discharge.  Neck: Neck supple.  Cardiovascular: Normal heart sounds.   Pulmonary/Chest: Effort normal. He has wheezes. He exhibits no tenderness.       Prolonged expirations.  Abdominal: Soft. There is no tenderness. There is no rebound.  Genitourinary:       In dwelling foley with dark urine  Musculoskeletal: He exhibits no tenderness.       Baseline ROM, no obvious new focal weakness.  Neurological:       Mental status and motor strength appears baseline for patient and situation.  Skin: No rash noted.  Psychiatric: He has a normal mood and affect.    ED Course  Procedures (including critical care time) Results for orders placed during the hospital encounter of 12/13/12  CBC WITH DIFFERENTIAL      Component Value Range   WBC 11.1 (*) 4.0 - 10.5 K/uL   RBC 2.91 (*) 4.22 - 5.81 MIL/uL   Hemoglobin 8.9 (*) 13.0 - 17.0 g/dL   HCT 65.7 (*) 84.6 - 96.2 %   MCV 93.5  78.0 - 100.0 fL   MCH 30.6  26.0 - 34.0 pg   MCHC 32.7  30.0 - 36.0 g/dL   RDW 95.2  84.1 - 32.4 %   Platelets 397  150 - 400 K/uL   Neutrophils Relative 72  43 - 77 %   Neutro Abs 8.0 (*) 1.7 - 7.7 K/uL   Lymphocytes Relative 11 (*) 12 - 46 %   Lymphs Abs 1.3  0.7 - 4.0 K/uL   Monocytes Relative 13 (*) 3 - 12 %   Monocytes Absolute 1.4 (*) 0.1 - 1.0 K/uL   Eosinophils Relative 3  0 - 5 %   Eosinophils Absolute 0.3  0.0 - 0.7 K/uL   Basophils Relative 1  0 - 1 %   Basophils Absolute 0.1  0.0 - 0.1 K/uL  BASIC METABOLIC PANEL      Component Value Range   Sodium 138  135 - 145 mEq/L   Potassium 4.9  3.5 - 5.1 mEq/L   Chloride 107  96 - 112 mEq/L   CO2 20  19 - 32 mEq/L   Glucose,  Bld 114 (*) 70 - 99 mg/dL   BUN 33 (*) 6 - 23 mg/dL   Creatinine, Ser 1.61 (*) 0.50 - 1.35 mg/dL   Calcium 8.5  8.4 - 09.6 mg/dL   GFR calc non Af Amer 29 (*) >90 mL/min   GFR calc Af Amer 33 (*) >90 mL/min  BLOOD GAS, ARTERIAL      Component Value Range   O2 Content 4.0     Delivery systems NASAL CANNULA     pH, Arterial 7.430  7.350 - 7.450   pCO2 arterial 28.5 (*) 35.0 - 45.0 mmHg   pO2, Arterial 71.6 (*) 80.0 - 100.0 mmHg   Bicarbonate 18.6 (*) 20.0 - 24.0 mEq/L   TCO2 17.2  0 - 100 mmol/L   Acid-base deficit 5.0 (*) 0.0 - 2.0 mmol/L   O2 Saturation 95.4     Patient temperature 37.0     Collection site LEFT RADIAL     Drawn by COLLECTED BY RT     Sample type ARTERIAL     Allens test (pass/fail) PASS  PASS   Ct Abdomen Pelvis Wo Contrast  Dg Chest 1 View  12/13/2012  *RADIOLOGY REPORT*  Clinical Data: Shortness of breath.  CHEST - 1 VIEW  Comparison: 12/07/2012  Findings: Normal heart size and pulmonary vascularity. Emphysematous changes in the lungs.  Infiltration or airspace disease in the lung bases, greater on the left.  This is stable since previous study.  Changes could represent atelectasis or pneumonia.  No blunting of costophrenic angles.  No pneumothorax. Calcified and tortuous aorta.  Degenerative changes in the shoulders.  IMPRESSION: Emphysematous changes with infiltration or atelectasis in both lung bases, similar to previous study.   Original Report Authenticated By: Burman Nieves, M.D.    Dg Chest 2 View  12/04/2012  *RADIOLOGY REPORT*  Clinical Data: Chronic cough  CHEST - 2 VIEW  Comparison: 05/13/2008  Findings: The heart and pulmonary vascularity are within normal limits.  Some atelectasis is seen in the left lung base. Hyperinflation consistent with COPD is noted.  No acute bony abnormality is seen.  IMPRESSION: Atelectasis versus scarring in the left lung base.   Original Report Authenticated By: Alcide Clever, M.D.    Dg Chest Port 1 View  12/07/2012   *RADIOLOGY REPORT*  Clinical Data: Shortness of breath and wheezing.  PORTABLE CHEST - 1 VIEW  Comparison: Chest x-ray 12/04/2012.  Findings: Increasing bibasilar opacities may reflect worsening areas of atelectasis and/or consolidation, with increasing small bilateral pleural effusions (left greater than right).  Pulmonary vasculature is normal.  Heart size is within normal limits. Atherosclerosis in the thoracic aorta.  IMPRESSION: 1.  Worsening bibasilar aeration may reflect increasing areas of atelectasis and/or consolidation in the lower lobes of the lungs bilaterally (left greater than right), with increasing small bilateral pleural effusions (left greater than right). 2.  Atherosclerosis.   Original Report Authenticated By: Trudie Reed, M.D.     6:28 AM:  T/C to Dr. Phillips Odor, hospitalist, case discussed, including:  HPI, pertinent PM/SHx, VS/PE, dx testing, ED  course and treatment.  Agreeable to admission.  Requests to write temporary orders, telemetry bed.  MDM  Patient discharged from the hospital yesterday following a TURP and bladder tumor resection (positive for CA) who had shortness of breath while in the hospital which became worse once he went home. He is currently on Levaquin. He was to get home O2 today. O2 sats on arrival on 2 L [place] were originally 89-90% and went quickly to 98% when nebulizer treatment was begun. Prolonged expirations and diffuse wheezing throughout.Continued O2 and repeated nebulizer treatment. Chest xray with continued  Atelectasis/consolidation in lower lobes. Labs with elevated WBC, stable anemia, worsening renal insufficiency. Will arrange for admission. Spoke with Dr. Phillips Odor who will admit the patient. Pt stable in ED with no significant deterioration in condition.The patient appears reasonably stabilized for admission considering the current resources, flow, and capabilities available in the ED at this time, and I doubt any other [facility] requiring further screening  and/or treatment in the ED prior to admission.  MDM Reviewed: previous chart, nursing note and vitals Reviewed previous: labs and x-ray Interpretation: labs and x-ray  CRITICAL CARE Performed by: Annamarie Dawley. Total critical care time: 35 Critical care time was exclusive of separately billable procedures and treating other patients. Critical care was necessary to treat or prevent imminent or life-threatening deterioration. Critical care was time spent personally by me on the following activities: development of treatment plan with patient and/or surrogate as well as nursing, discussions with consultants, evaluation of patient's response to treatment, examination of patient, obtaining history from patient or surrogate, ordering and performing treatments and interventions, ordering and review of laboratory studies, ordering and review of radiographic studies, pulse oximetry and re-evaluation of patient's condition.        Nicoletta Dress. Colon Branch, MD 12/13/12 [id number]  Nicoletta Dress. Colon Branch, MD 12/13/12 [phone]

## 2012-12-13 NOTE — Progress Notes (Signed)
12/13/12 1328 Order placed to have foley catheter d/c'd. Patient states had foley catheter placed after procedure for urinary retention. States Dr Jerre Simon wanted him to keep catheter in place until follow-up at his office. Notified Dr Karilyn Cota, stated okay to leave foley catheter in place. Earnstine Regal, RN

## 2012-12-13 NOTE — ED Notes (Signed)
Patient here for shortness of breath, was discharged from 3rd floor yesterday. Stated woke up and could not catch his breath. Also reports could not cough up phlegm.

## 2012-12-13 NOTE — Progress Notes (Signed)
12/13/12 1731 Patient rested and has been sleeping some since receiving solumedrol 125 mg IV x 1 and ativan 0.5 mg IV dose this afternoon.  Patient stated feeling better after receiving medication and resting some. No further c/o anxiety or dyspnea so far this evening. Nursing to monitor. Earnstine Regal, RN

## 2012-12-13 NOTE — ED Notes (Signed)
Patient received one albuterol treatment per ems pta.

## 2012-12-13 NOTE — Progress Notes (Signed)
12/13/12 1455 Called to patient's room this afternoon about 1440, stated became short of breath, was anxious and tearful. O2 sats 98% on r/a, repositioned patient for comfort, emotional support provided. Stated felt some better with repositioning. Received neb treatment about 1400 this afternoon, less wheezing noted after treatment. Notified Dr Karilyn Cota, orders placed for solu-medrol and ativan PRN for anxiety. Nursing to monitor. Earnstine Regal, RN

## 2012-12-13 NOTE — H&P (Signed)
[facility] History and Physical  Reginald Waller EAV:[id number] DOB: November 10, 1919 DOA: 12/13/2012  Referring physician: Dr. Colon Branch, ER physician. PCP: Kirk Ruths, MD  Specialists: Dr. Jerre Simon, urology.  Chief Complaint: Dyspnea.  HPI: Reginald Waller is a 79 y.o. male who was discharged just yesterday. He went home, he was supposed to have a home oxygen delivered but did not and just after midnight last night, he became short of breath. He now presents to the emergency room with dyspnea. On arrival to the emergency room he has required 4 L per minute oxygen per nasal cannula but when I saw him, he was tolerating 2 L per minute of oxygen while still there. He feels he can not cough anything up in his lungs. He has had no fever. Please see discharge summary done yesterday of his hospitalization recently. He was diagnosed with bladder cancer and has a Foley catheter in situ, he is due to follow with Dr. Jerre Simon, urology.   Review of Systems:  Apart from history of present illness, other systems negative.  Past Medical History  Diagnosis Date  . Hypertension   . Arthritis   . Stroke   . Malaria    Past Surgical History  Procedure Date  . Hernia repair   . Cataract extraction   . Appendectomy   . Eye surgery   . Cystoscopy 12/08/2012    Procedure: CYSTOSCOPY FLEXIBLE;  Surgeon: Ky Barban, MD;  Location: [facility];  Service: Urology;  Laterality: N/A;  . Transurethral resection of bladder tumor 12/09/2012    Procedure: TRANSURETHRAL RESECTION OF BLADDER TUMOR (TURBT);  Surgeon: Ky Barban, MD;  Location: [facility];  Service: Urology;  Laterality: N/A;   Social History:  He is married and lives at home with his wife. He does not drink alcohol and does not smoke cigarettes. He is retired.   Allergies  Allergen Reactions  . Shellfish Allergy Other (See Comments)    Swelling of throat and tongue.    Family History  Problem Relation Age of Onset  . Diabetes Mother     . Diabetes Father   . Cancer Sister     breast  . Cancer Sister     thyroid  . Cancer Brother     leukemia  . Cancer Brother   . Coronary artery disease Brother       Prior to Admission medications   Medication Sig Start Date End Date Taking? Authorizing Provider  acetaminophen (ARTHRITIS PAIN RELIEVER) 650 MG CR tablet Take 650 mg by mouth daily as needed. For pain    Historical Provider, MD  albuterol (PROVENTIL) (5 MG/ML) 0.5% nebulizer solution Take 0.5 mLs (2.5 mg total) by nebulization every 6 (six) hours as needed for wheezing or shortness of breath. 12/11/12   Tora Kindred York, PA  amLODipine (NORVASC) 10 MG tablet Take 1 tablet (10 mg total) by mouth daily. 12/11/12   Stephani Police, PA  cloNIDine (CATAPRES) 0.1 MG tablet Take 1 tablet (0.1 mg total) by mouth 2 (two) times daily. 12/11/12   Stephani Police, PA  ferrous sulfate 325 (65 FE) MG tablet Take 325 mg by mouth every morning.    Historical Provider, MD  latanoprost (XALATAN) 0.005 % ophthalmic solution Place 1 drop into both eyes at bedtime.    Historical Provider, MD  levofloxacin (LEVAQUIN) 750 MG tablet Take 1 tablet (750 mg total) by mouth every other day. Take 1 tablet Sunday 1/19, then 1 tablet Tuesday 1/21 12/12/12  Stephani Police, PA  levothyroxine (SYNTHROID, LEVOTHROID) 75 MCG tablet Take 75 mcg by mouth every morning.     Historical Provider, MD  phenylephrine (NEO-SYNEPHRINE) 0.5 % nasal solution Place 1 drop into the nose every 4 (four) hours as needed. Nasal congestion    Historical Provider, MD  polyethylene glycol powder (MIRALAX) powder Take 17 g by mouth daily. 11/26/12   Lyanne Co, MD  simvastatin (ZOCOR) 20 MG tablet Take 20 mg by mouth every morning.     Historical Provider, MD   Physical Exam: Filed Vitals:   12/13/12 0552 12/13/12 0600 12/13/12 0626 12/13/12 0700  BP: 130/87 112/44  117/39  Pulse: 91 85  88  Temp:      TempSrc:      Resp: 20 18  16   SpO2: 99% 96% 97% 94%     General:  He  looks systemically well. He is largely unchanged from when he was discharged yesterday. There is no increased work of breathing. There is no peripheral central cyanosis.  Eyes: No pallor. No jaundice.  ENT: No masses.  Neck: No lymphadenopathy.  Cardiovascular: Heart sounds are present without murmurs. He is in sinus rhythm.  Respiratory: Lung fields are essentially clear with occasional wheezing. There are no crackles or bronchial breathing.  Abdomen: Soft, nontender.  Skin: Erythematous  in the left arm consistent with IV infiltration from his admission recently. Slightly swollen. There was no evidence of DVT.  Musculoskeletal: No major abnormalities.  Psychiatric: Appropriate affect.  Neurologic: Alert and orientated without any focal neurological signs.  Labs on Admission:  Basic Metabolic Panel:  Lab 12/13/12 1610 12/12/12 0406 12/11/12 0404 12/10/12 0547 12/09/12 0602  NA 138 136 133* 136 139  K 4.9 4.6 4.3 4.5 4.8  CL 107 107 104 107 109  CO2 20 22 19 20 19   GLUCOSE 114* 89 93 92 97  BUN 33* 31* 31* 30* 32*  CREATININE 1.92* 1.76* 1.65* 1.62* 2.27*  CALCIUM 8.5 8.5 8.4 8.5 8.5  MG -- -- -- -- --  PHOS -- -- -- -- --       CBC:  Lab 12/13/12 0541 12/12/12 0406 12/11/12 0404 12/10/12 0547 12/09/12 0602  WBC 11.1* 9.3 9.2 9.0 9.4  NEUTROABS 8.0* -- -- -- --  HGB 8.9* 9.1* 9.0* 9.4* 9.4*  HCT 27.2* 27.8* 27.4* 29.0* 29.1*  MCV 93.5 93.9 94.2 94.2 95.4  PLT 397 395 343 358 328      BNP (last 3 results)  Basename 12/11/12 0404 12/08/12 0534  PROBNP 6791.0* 16208.0*      Radiological Exams on Admission: Dg Chest 1 View  12/13/2012  *RADIOLOGY REPORT*  Clinical Data: Shortness of breath.  CHEST - 1 VIEW  Comparison: 12/07/2012  Findings: Normal heart size and pulmonary vascularity. Emphysematous changes in the lungs.  Infiltration or airspace disease in the lung bases, greater on the left.  This is stable since previous study.  Changes could represent  atelectasis or pneumonia.  No blunting of costophrenic angles.  No pneumothorax. Calcified and tortuous aorta.  Degenerative changes in the shoulders.  IMPRESSION: Emphysematous changes with infiltration or atelectasis in both lung bases, similar to previous study.   Original Report Authenticated By: Burman Nieves, M.D.    US Venous Img Upper Uni Left  12/11/2012  *RADIOLOGY REPORT*  Clinical Data:  Left upper extremity edema, evaluate for DVT  LEFT UPPER EXTREMITY VENOUS DUPLEX ULTRASOUND  Technique:  Gray-scale sonography with graded compression, as well as color Doppler  and duplex ultrasound were performed to evaluate the upper extremity deep venous system from the level of the subclavian vein and including the jugular, axillary, basilic and upper cephalic vein.  Spectral Doppler was utilized to evaluate flow at rest and with distal augmentation maneuvers.  Comparison:  None.  Findings:  Normal compressibility of the upper extremity deep veins is demonstrated.  No venous filling defects visualized on grayscale or color Doppler US.  Normal direction of flow is seen throughout the deep veins.  Spectral Doppler waveforms show normal morphology at rest and with distal augmentation.  Subcutaneous edema is noted within the forearm.  IMPRESSION: No evidence of deep venous thrombosis within the left upper extremity.   Original Report Authenticated By: Tacey Ruiz, MD       Assessment/Plan   1. Atelectasis. No clinical features of pneumonia. 2.  Recently diagnosed bladder cancer, Foley catheter in situ, to be followed by urology. 3. Hypertension. 4. Acute renal failure secondary to hydronephrosis.  Plan: 1. Admits to medical floor. 2. Incentive spirometry. 3. Titrate oxygen via nasal cannula depending on his O2 sats. 4. Mobilize. 5. Discontinue Foley catheter to see if he will tolerate.  Code Status: Full code.  Family Communication: Discussed plan with patient and wife at the bedside.    Disposition Plan: Home in medically stable.   Time spent: 30 minutes.  Wilson Singer [facility] Pager [phone].  If 7PM-7AM, please contact night-coverage [email or web address] Password [facility] 12/13/2012, 7:35 AM

## 2012-12-13 NOTE — ED Notes (Signed)
Edema and redness noted to left arm. Patient states it was due to previous iv.

## 2012-12-14 LAB — CBC
MCH: 30.1 pg (ref 26.0–34.0)
MCHC: 32.4 g/dL (ref 30.0–36.0)
MCV: 93 fL (ref 78.0–100.0)
Platelets: 419 10*3/uL — ABNORMAL HIGH (ref 150–400)
RDW: 14.9 % (ref 11.5–15.5)

## 2012-12-14 LAB — COMPREHENSIVE METABOLIC PANEL
AST: 26 U/L (ref 0–37)
Albumin: 2.4 g/dL — ABNORMAL LOW (ref 3.5–5.2)
Alkaline Phosphatase: 78 U/L (ref 39–117)
BUN: 36 mg/dL — ABNORMAL HIGH (ref 6–23)
Chloride: 105 mEq/L (ref 96–112)
Potassium: 4.8 mEq/L (ref 3.5–5.1)
Total Bilirubin: 0.3 mg/dL (ref 0.3–1.2)

## 2012-12-14 MED ORDER — LORAZEPAM 0.5 MG PO TABS: 0.5000 mg | ORAL_TABLET | Freq: Four times a day (QID) | ORAL | Status: DC | PRN

## 2012-12-14 NOTE — Progress Notes (Signed)
     Subjective: This man feels somewhat better today. Unfortunately, his oxygen still has not arrived at home.           Physical Exam: Blood pressure 130/64, pulse 91, temperature 97.4 F (36.3 C), temperature source Oral, resp. rate 24, height 5\' 11"  (1.803 m), weight 79.5 kg (175 lb 4.3 oz), SpO2 93.00%. He looks systemically well. Is not toxic or septic. Lung fields are entirely clear with no crackles or wheezing. He is alert and orientated. Heart sounds are present without murmurs or gallop rhythm.   Investigations:  Recent Results (from the past 240 hour(s))  URINE CULTURE     Status: Normal   Collection Time   12/04/12  7:59 PM      Component Value Range Status Comment   Specimen Description URINE, CLEAN CATCH   Final    Special Requests NONE   Final    Culture  Setup Time 12/05/2012 06:55   Final    Colony Count NO GROWTH   Final    Culture NO GROWTH   Final    Report Status 12/06/2012 FINAL   Final   MRSA PCR SCREENING     Status: Normal   Collection Time   12/08/12 11:07 AM      Component Value Range Status Comment   MRSA by PCR NEGATIVE  NEGATIVE Final      Basic Metabolic Panel:  Basename 12/14/12 0650 12/13/12 0541  NA 136 138  K 4.8 4.9  CL 105 107  CO2 19 20  GLUCOSE 137* 114*  BUN 36* 33*  CREATININE 1.72* 1.92*  CALCIUM 8.6 8.5  MG -- --  PHOS -- --   Liver Function Tests:  [facility] 12/14/12 0650  AST 26  ALT 16  ALKPHOS 78  BILITOT 0.3  PROT 5.7*  ALBUMIN 2.4*     CBC:  Basename 12/14/12 0650 12/13/12 0541  WBC 5.9 11.1*  NEUTROABS -- 8.0*  HGB 9.0* 8.9*  HCT 27.8* 27.2*  MCV 93.0 93.5  PLT 419* 397    Dg Chest 1 View  12/13/2012  *RADIOLOGY REPORT*  Clinical Data: Shortness of breath.  CHEST - 1 VIEW  Comparison: 12/07/2012  Findings: Normal heart size and pulmonary vascularity. Emphysematous changes in the lungs.  Infiltration or airspace disease in the lung bases, greater on the left.  This is stable since previous  study.  Changes could represent atelectasis or pneumonia.  No blunting of costophrenic angles.  No pneumothorax. Calcified and tortuous aorta.  Degenerative changes in the shoulders.  IMPRESSION: Emphysematous changes with infiltration or atelectasis in both lung bases, similar to previous study.   Original Report Authenticated By: Burman Nieves, M.D.       Medications: I have reviewed the patient's current medications.  Impression: 1. Atelectasis, clinically improving. 2. Acute renal failure, improving. 3. Hypertension. 4. Bladder cancer. 5. Anxiety.     Plan: 1. Continue hospitalization. 2. Monitor renal function. 3. Probable discharge home tomorrow.     LOS: 1 day   Wilson Singer Pager [phone]  12/14/2012, 8:04 AM

## 2012-12-14 NOTE — Progress Notes (Signed)
12/14/12 1258 Patient ambulated in hallway with front-wheel walker and nurse stand-by assist. Ambulated from room in hallway, made loop around nurses' station and back to room. General weakness, some dyspnea with activity, O2 sats 94% on r/a on return to room. Repositioned for comfort, bedalarm on for safety, call light within reach. Stated comfortable. Nursing to monitor. Earnstine Regal, RN

## 2012-12-14 NOTE — Progress Notes (Signed)
12/14/12 1445 Patient's niece expressed concerns regarding patient follow-up appointment with Dr Jerre Simon tomorrow afternoon at 1:15 pm. Notified Dr Karilyn Cota of concerns, stated patient should be discharged in time for appointment. Stated pt should be able to attend appointment tomorrow after discharge. Also notified of shortness of breath with ambulation, but O2 sats within normal limits.  [facility] this [facility] regarding home [facility]2 delivery, stated no orders on file for them to deliver home O2. Discussed with Dr Karilyn Cota as well. Order received to check stat d-dimer. Patient asleep, appears comfortable on follow-up. D-dimer level <0.27, text-paged Dr Karilyn Cota to notify. Earnstine Regal, RN

## 2012-12-15 ENCOUNTER — Inpatient Hospital Stay (HOSPITAL_COMMUNITY): Payer: Medicare Other

## 2012-12-15 DIAGNOSIS — N3289 Other specified disorders of bladder: Secondary | ICD-10-CM

## 2012-12-15 DIAGNOSIS — I5033 Acute on chronic diastolic (congestive) heart failure: Principal | ICD-10-CM

## 2012-12-15 DIAGNOSIS — I509 Heart failure, unspecified: Secondary | ICD-10-CM

## 2012-12-15 LAB — BLOOD GAS, ARTERIAL
Bicarbonate: 18.4 mEq/L — ABNORMAL LOW (ref 20.0–24.0)
O2 Content: 3 L/min
O2 Saturation: 94.9 %
pH, Arterial: 7.44 (ref 7.350–7.450)
pO2, Arterial: 72.8 mmHg — ABNORMAL LOW (ref 80.0–100.0)

## 2012-12-15 LAB — URINALYSIS, ROUTINE W REFLEX MICROSCOPIC
Glucose, UA: NEGATIVE mg/dL
Protein, ur: 100 mg/dL — AB

## 2012-12-15 LAB — CBC
MCH: 29.9 pg (ref 26.0–34.0)
MCHC: 32.2 g/dL (ref 30.0–36.0)
Platelets: 502 10*3/uL — ABNORMAL HIGH (ref 150–400)

## 2012-12-15 LAB — TROPONIN I
Troponin I: 0.55 ng/mL (ref <0.30)
Troponin I: 0.51 ng/mL (ref <0.30)

## 2012-12-15 LAB — BASIC METABOLIC PANEL
Calcium: 8.6 mg/dL (ref 8.4–10.5)
GFR calc non Af Amer: 29 mL/min — ABNORMAL LOW (ref >90)
Sodium: 140 mEq/L (ref 135–145)

## 2012-12-15 MED ORDER — FUROSEMIDE 10 MG/ML IJ SOLN: 20.0000 mg | Freq: Once | INTRAMUSCULAR | Status: DC

## 2012-12-15 MED ORDER — VANCOMYCIN HCL IN DEXTROSE 1-5 GM/200ML-% IV SOLN
1000.0000 mg | INTRAVENOUS | Status: DC
  Administered 2012-12-15 – 2012-12-17 (×3): 1000 mg via INTRAVENOUS
  Filled 2012-12-15 (×4): qty 200

## 2012-12-15 MED ORDER — METHYLPREDNISOLONE SODIUM SUCC 125 MG IJ SOLR
125.0000 mg | Freq: Four times a day (QID) | INTRAMUSCULAR | Status: DC
  Administered 2012-12-15 – 2012-12-16 (×3): 125 mg via INTRAVENOUS
  Filled 2012-12-15 (×4): qty 2

## 2012-12-15 MED ORDER — ALBUTEROL SULFATE (5 MG/ML) 0.5% IN NEBU
2.5000 mg | INHALATION_SOLUTION | Freq: Four times a day (QID) | RESPIRATORY_TRACT | Status: DC
  Administered 2012-12-15: 2.5 mg via RESPIRATORY_TRACT
  Filled 2012-12-15: qty 0.5

## 2012-12-15 MED ORDER — SODIUM CHLORIDE 0.9 % IV SOLN
INTRAVENOUS | Status: DC
  Administered 2012-12-16: 03:00:00 via INTRAVENOUS

## 2012-12-15 MED ORDER — CEPHALEXIN 500 MG PO CAPS: 500.0000 mg | ORAL_CAPSULE | Freq: Three times a day (TID) | ORAL | Status: DC

## 2012-12-15 MED ORDER — ALBUTEROL SULFATE (5 MG/ML) 0.5% IN NEBU
2.5000 mg | INHALATION_SOLUTION | RESPIRATORY_TRACT | Status: DC
  Administered 2012-12-15 – 2012-12-19 (×20): 2.5 mg via RESPIRATORY_TRACT
  Filled 2012-12-15 (×21): qty 0.5

## 2012-12-15 MED ORDER — MORPHINE SULFATE 2 MG/ML IJ SOLN
2.0000 mg | INTRAMUSCULAR | Status: DC | PRN
  Administered 2012-12-15 – 2012-12-17 (×5): 2 mg via INTRAVENOUS
  Filled 2012-12-15 (×5): qty 1

## 2012-12-15 MED ORDER — FUROSEMIDE 10 MG/ML IJ SOLN
40.0000 mg | Freq: Two times a day (BID) | INTRAMUSCULAR | Status: DC
  Administered 2012-12-15 – 2012-12-16 (×4): 40 mg via INTRAVENOUS
  Filled 2012-12-15 (×4): qty 4

## 2012-12-15 NOTE — Care Management Note (Signed)
    Page 1 of 1   12/19/2012     1:28:12 PM   CARE MANAGEMENT NOTE 12/19/2012  Patient:  Reginald Waller, Reginald Waller   Account Number:  [id number]  Date Initiated:  12/15/2012  Documentation initiated by:  Rosemary Holms  Subjective/Objective Assessment:   Pt admitted for SOB. Hx of Bladder Ca. Lives at home with spouse and has a neice who is very involved with care. RCM at bedside and a new change in pt required MD presence. to transfer to ICU     Action/Plan:   Anticipated DC Date:  12/19/2012   Anticipated DC Plan:  SKILLED NURSING FACILITY      DC Planning Services  CM consult      Choice offered to / List presented to:             [facility] agency  [facility]   Status of service:  Completed, signed off Medicare Important Message given?  YES (If response is "NO", the following Medicare IM given date fields will be blank) Date Medicare IM given:  12/18/2012 Date Additional Medicare IM given:    Discharge Disposition:  SKILLED NURSING FACILITY  Per UR Regulation:    If discussed at Long Length of Stay Meetings, dates discussed:   12/18/2012    Comments:  12/18/12 Rosemary Holms RN BSN CM  12/15/12 Tequan Redmon RN BSN CM

## 2012-12-15 NOTE — Progress Notes (Signed)
Pt. Exhibiting following changes at 1:45pm today. Left sided weakness in arm and leg.. Left sided partial facial drop. Pts speech is slurred, and noticeably different from baseline. Vitals: BP 148/87, pulse 92, O296. Dr. Karilyn Cota notified. Orders for MRI received. Pt. Last seen at baseline approximately 1:15pm.

## 2012-12-15 NOTE — Progress Notes (Signed)
Pt. Order to be ambulated in halls. Pt. SOB sitting in bed and therefore was not ambulated, expiratory wheezing present. Lungs sound clear and diminished bilaterally. Pt. Placed back on O2 after breakfast, saturation was in high 80s prior to placing back on O2. Pt. Saturation with O2 at 2L high 90s

## 2012-12-15 NOTE — Clinical Social Work Psychosocial (Signed)
Clinical Social Work Department BRIEF PSYCHOSOCIAL ASSESSMENT 12/15/2012  Patient:  Reginald Waller, Reginald Waller     Account Number:  [id number]     Admit date:  12/13/2012  Clinical Social Worker:  Nancie Neas  Date/Time:  12/15/2012 11:40 AM  Referred by:  Physician  Date Referred:  12/15/2012 Referred for  SNF Placement   Other Referral:   Interview type:  Patient Other interview type:    PSYCHOSOCIAL DATA Living Status:  FAMILY Admitted from facility:   Level of care:   Primary support name:  Mary Primary support relationship to patient:  SPOUSE Degree of support available:   supportive  nieces involved as well    CURRENT CONCERNS Current Concerns  Post-Acute Placement   Other Concerns:   in home care    SOCIAL WORK ASSESSMENT / PLAN CSW met with pt at bedside following referral for potential SNF. Pt alert and oriented and reports he lives at home with his wife. They have been married over 60 years. Pt was evaluated by PT today and recommendation is for home health. Pt states his wife is best support in addition to his niece and her niece. The couple have no children. Pt said they manage well at home. Both are still driving. He indicates his wife has some short term memory problems but is staying at home by herself now and reports she is safe to do so. She is able to drive to see pt herself but also has nieces continuing to check on her during his hospitalization. Pt uses rolling walker at baseline. Nieces come by pt's home daily to check on them. CSW discussed d/c plans and offered option of ALF. Pt understands this would be private pay. He wants to be with his wife most importantly. CSW explained that she could be placed at ALF from home in order for them to be together. Pt is aware of [address] and knows people who are there as residents. He states that he is concerned about his breathing and feels if that improves he will be fine to return home. Pt is willing to consider  private duty care in the home during the day and will pay privately for this service. He wants to discuss this with his wife first. When CSW asked about discussing with family, he clarified just his wife.   Assessment/plan status:  Referral to [facility] Other assessment/ plan:   Information/referral to community resources:   CM for home health/private duty care list    PATIENT'S/FAMILY'S RESPONSE TO PLAN OF CARE: Pt is very pleasant and open to discussing d/c planning. He wants to return home but is willing to consider home health and private duty care. CM notified. CSW signing off but can be reconsulted if needed.        Derenda Fennel, [place] [phone]

## 2012-12-15 NOTE — Therapy (Signed)
Speech Pathology  Acknowledge new order for clinical swallow evaluation in setting of acute CVA earlier today. I spoke with his nurse, Marcelino Duster who said he did well with a little bit of applesauce but not with water. Recommend NPO except meds tonight with SLP to evaluate tomorrow.  Thank you,  Havery Moros, CCC-SLP [phone]

## 2012-12-15 NOTE — Progress Notes (Addendum)
     Subjective: This man feels worse today, he is wheezing clearly when I entered the room and is more short of breath. This is different from his appearance yesterday. He denies any chest pain.           Physical Exam: Blood pressure 139/66, pulse 87, temperature 98.3 F (36.8 C), temperature source Oral, resp. rate 22, height 5\' 11"  (1.803 m), weight 79.5 kg (175 lb 4.3 oz), SpO2 97.00%. Is not toxic or septic. Lung fields show wheezing throughout with a few crackles. Jugular venous pressure is now elevated. He does have peripheral pitting edema in his legs. He is alert and orientated. Heart sounds are present without murmurs or gallop rhythm.   Investigations:  Recent Results (from the past 240 hour(s))  MRSA PCR SCREENING     Status: Normal   Collection Time   12/08/12 11:07 AM      Component Value Range Status Comment   MRSA by PCR NEGATIVE  NEGATIVE Final      Basic Metabolic Panel:  Basename 12/15/12 0343 12/14/12 0650  NA 140 136  K 4.5 4.8  CL 108 105  CO2 21 19  GLUCOSE 112* 137*  BUN 49* 36*  CREATININE 1.90* 1.72*  CALCIUM 8.6 8.6  MG -- --  PHOS -- --   Liver Function Tests:  [facility] 12/14/12 0650  AST 26  ALT 16  ALKPHOS 78  BILITOT 0.3  PROT 5.7*  ALBUMIN 2.4*     CBC:  Basename 12/15/12 0343 12/14/12 0650 12/13/12 0541  WBC 12.9* 5.9 --  NEUTROABS -- -- 8.0*  HGB 9.2* 9.0* --  HCT 28.6* 27.8* --  MCV 92.9 93.0 --  PLT 502* 419* --    No results found.    Medications: I have reviewed the patient's current medications.  Impression: 1. Fluid overload/acute diastolic congestive heart failure. 2. Acute renal failure, improving. 3. Hypertension. 4. Bladder cancer. 5. Anxiety.     Plan: 1. Continue hospitalization. 2. Intravenous Lasix twice a day 40 mg IV. 3. Chest x-ray. 4. I do not think he can be discharged home today. He may need short stay at rehabilitation in a skilled nursing facility. 5. Urology consultation  as an inpatient. He was due to see Dr. Jerre Simon today in his office.     LOS: 2 days   Wilson Singer Pager [phone]  12/15/2012, 8:54 AM

## 2012-12-15 NOTE — Progress Notes (Addendum)
Patient has had a sudden change in his condition. He appeared to have left-sided weakness in his arm and leg. He had a left sided partial facial droop. He appears to have more wheezing also and is very anxious/scared. On physical examination, he is hemodynamically stable. There is no peripheral central cyanosis. He has bilateral wheezing throughout and signs of bilateral pleural effusions. He has pitting edema in his legs as noted earlier today. He has a very mild left upper motor neuron facial palsy. He has left arm weakness, power is 3/5. In his legs, there is also some weakness in the left leg, power 4/5. Both plantars seem to be upgoing.  Impression: 1. Probable right hemispheric CVA versus TIA. 2. Wheezing-pulmonary venous congestion/edema versus bronchospasm.  Plan: 1. Bronchodilators and IV steroids stat. 2. Arterial blood gas stat. 3. Chest x-ray stat. 4. Try to get MRI brain scan although this may be difficult at this time. 5. Move patient to the intensive care unit for the time being. 6. DO NOT RESUSCITATE. I have had a long discussion with the patient's niece, who is the health care power of attorney, and she is in agreement with the above. I have also spoken with the patient's wife and updated her on her husband's condition. 7. I'm not convinced that TPA at this time would be of benefit to this 79 year old man, he has recently had TUR bladder and had fairly prolonged hematuria there afterwards.  Addendum:MRI Brain shows acute posterior R frontal lobe small CVA.

## 2012-12-15 NOTE — Progress Notes (Signed)
CRITICAL VALUE ALERT  Critical value received:  Troponin 0.55  Date of notification:  12/15/2012  Time of notification:  1745  Critical value read back:yes  Nurse who received alert:  M. Winona Legato, RN  MD notified (1st page):  Dr Karilyn Cota  Time of first page:  1755  MD notified (2nd page):  Time of second page:  Responding MD:  Dr Karilyn Cota  Time MD responded:  [phone]

## 2012-12-15 NOTE — Consult Note (Addendum)
WOC consult Note Reason for Consult: Consult requested for left arm; performed remotely via phone. Pt is reported in progress notes to have had an IV infiltrate last week from unknown medication during a previous admission. Apparently, there was no open wound at that time, and bedside nurse states after his assessment of the site this AM, there is no open wound at this time.  He describes left arm with affected area as approx 5X5 cm swollen area with generalized erythremia and large amt yellow drainage.  No odor.   Topical care will be minimally effective since this appears to be cellulitis and the problem is under the surface of the skin. Goal will be to absorb drainage and avoid maceration of the skin. Dressing procedure/placement/frequency: Calcium alginate is a highly absorptive dressing which will wick drainage away from skin. If cellulitis persists or becomes more severe, please consider antibiotics for systemic treatment, since topical treatment will be minimally effective for cellulitis as there is no open wound. Will not plan to follow further unless re-consulted.  [address] RN, MSN, [employer]  [phone]

## 2012-12-15 NOTE — Progress Notes (Signed)
ANTIBIOTIC CONSULT NOTE - INITIAL  Pharmacy Consult for Vancomycin Indication: pneumonia  Allergies  Allergen Reactions  . Shellfish Allergy Other (See Comments)    Swelling of throat and tongue.    Patient Measurements: Height: 5\' 11"  (180.3 cm) Weight: 175 lb 4.3 oz (79.5 kg) IBW/kg (Calculated) : 75.3  Ad  Vital Signs: Temp: 98.3 F (36.8 C) (01/20 0452) Temp src: Oral (01/20 0452) BP: 139/66 mmHg (01/20 0452) Pulse Rate: 87  (01/20 0452) Intake/Output from previous day: 01/19 0701 - 01/20 0700 In: 2057 [P.O.:1500; I.V.:557] Out: 1025 [Urine:1025] Intake/Output from this shift: Total I/O In: -  Out: 900 [Urine:900]  Labs:  [facility] 12/15/12 0343 12/14/12 0650 12/13/12 0541  WBC 12.9* 5.9 11.1*  HGB 9.2* 9.0* 8.9*  PLT 502* 419* 397  LABCREA -- -- --  CREATININE 1.90* 1.72* 1.92*   Estimated Creatinine Clearance: 27 ml/min (by C-G formula based on Cr of 1.9). No results found for this basename: VANCOTROUGH:2,VANCOPEAK:2,VANCORANDOM:2,GENTTROUGH:2,GENTPEAK:2,GENTRANDOM:2,TOBRATROUGH:2,TOBRAPEAK:2,TOBRARND:2,AMIKACINPEAK:2,AMIKACINTROU:2,AMIKACIN:2, in the last 72 hours   Microbiology: Recent Results (from the past 720 hour(s))  URINE CULTURE     Status: Normal   Collection Time   12/04/12  7:59 PM      Component Value Range Status Comment   Specimen Description URINE, CLEAN CATCH   Final    Special Requests NONE   Final    Culture  Setup Time 12/05/2012 06:55   Final    Colony Count NO GROWTH   Final    Culture NO GROWTH   Final    Report Status 12/06/2012 FINAL   Final   MRSA PCR SCREENING     Status: Normal   Collection Time   12/08/12 11:07 AM      Component Value Range Status Comment   MRSA by PCR NEGATIVE  NEGATIVE Final     Medical History: Past Medical History  Diagnosis Date  . Hypertension   . Arthritis   . Stroke   . Malaria     Medications:  Scheduled:    . albuterol  2.5 mg Nebulization Q4H  . amLODipine  10 mg Oral Daily  .  antiseptic oral rinse  15 mL Mouth Rinse BID  . cloNIDine  0.1 mg Oral BID  . ferrous sulfate  325 mg Oral q morning - 10a  . furosemide  40 mg Intravenous Q12H  . heparin  5,000 Units Subcutaneous Q8H  . latanoprost  1 drop Both Eyes QHS  . levofloxacin  750 mg Oral Q48H  . levothyroxine  75 mcg Oral QAC breakfast  . methylPREDNISolone (SOLU-MEDROL) injection  125 mg Intravenous Q6H  . polyethylene glycol  17 g Oral Daily  . simvastatin  20 mg Oral q morning - 10a  . vancomycin  1,000 mg Intravenous Q24H  . [DISCONTINUED] albuterol  2.5 mg Nebulization Q6H  . [DISCONTINUED] albuterol  2.5 mg Nebulization Q6H  . [DISCONTINUED] cephALEXin  500 mg Oral Q8H  . [DISCONTINUED] furosemide  20 mg Intravenous Once   Assessment: Vancomycin added for suspected pneumonia. Patient on Levaquin 750 mg every other day  Goal of Therapy:  Vancomycin trough level 15-20 mcg/ml  Plan:  Vancomycin 1 GM IV every 24 hours Vancomycin trough at steady state Monitor renal function Labs per protocol  Raquel James, Dany Walther Bennett 12/15/2012,3:55 PM

## 2012-12-15 NOTE — Clinical Documentation Improvement (Signed)
CHF DOCUMENTATION CLARIFICATION QUERY  THIS DOCUMENT IS NOT A PERMANENT PART OF THE MEDICAL RECORD  TO RESPOND TO THE THIS QUERY, FOLLOW THE INSTRUCTIONS BELOW:  1. If needed, update documentation for the patient's encounter via the notes activity.  2. Access this query again and click edit on the In [employer].  3. After updating, or not, click F2 to complete all highlighted (required) fields concerning your review. Select "additional documentation in the medical record" OR "no additional documentation provided".  4. Click Sign note button.  5. The deficiency will fall out of your In Basket *Please let us know if you are not able to complete this workflow by phone or e-mail (listed below).  Please update your documentation within the medical record to reflect your response to this query.                                                                                    12/15/12  Dear Dr. [facility],  In a better effort to capture your patient's severity of illness, reflect appropriate length of stay and utilization of resources, a review of the patient medical record has revealed the following indicators the diagnosis of Heart Failure.   Based on your clinical judgment, please clarify and document in a progress note and/or discharge summary the clinical condition associated with the following supporting information: In responding to this query please exercise your independent judgment.  The fact that a query is asked, does not imply that any particular answer is desired or expected.  Please clarify acuity of documented diastolic CHF  Possible Clinical Conditions?  Chronic Diastolic Congestive Heart Failure Acute Diastolic Congestive Heart Failure Acute on Chronic Diastolic Congestive Heart Failure Other Condition________________________________________ Cannot Clinically Determine  Clinical Information:  Risk Factors: 1/20progress note: fluid overload/diastolic chf    Signs & Symptoms  1/20 progress note: peripheral pitting edema in his legs Wheezing Shortness of breath  Diagnostics: BNP: Pending  1/13 Echo results: EF:60-65%  Radiology:IMPRESSION:Bibasilar predominant air space disease and bilateral pleural effusions, slightly worsened from 12/13/2012.  Treatment: IV Lasix 40mg  q12h BNP ordered for 1/21   Reviewed: additional documentation in the medical record  Thank You,  Debora T Williams RN, MSN Clinical Documentation Specialist: Office# [phone] [facility] Health Information Management [place]

## 2012-12-15 NOTE — Evaluation (Signed)
Physical Therapy Evaluation Patient Details Name: Reginald Waller MRN: [id number] DOB: 1919/09/12 Today's Date: 12/15/2012 Time: 1040-1130 PT Time Calculation (min): 50 min  PT Assessment / Plan / Recommendation Clinical Impression  Pt readmitted with dyspnea. His functional status has not changed since last eval on 12-09-12.  He has been on 2 L O2 while at rest, but we needed to increase O2 to 3 L for gait.  He tends to breathe through his mouth and needed frequent cues to breath through his nose.  Pt is certainly deconditioned at baseline, but is functioning at too high a level to qualify for SNF.  I spoke with him about he and wife moving into an ACLF or having a paid CG with them during the day.  In my opinion, he  and wife (with dementia) should not be living alone.    PT Assessment  Patient needs continued PT services    Follow Up Recommendations  Home health PT    Does the patient have the potential to tolerate intense rehabilitation      Barriers to Discharge Decreased caregiver support      Equipment Recommendations  None recommended by PT    Recommendations for Other Services     Frequency Min 3X/week    Precautions / Restrictions Precautions Precautions: Fall Restrictions Weight Bearing Restrictions: No   Pertinent Vitals/Pain       Mobility  Bed Mobility Bed Mobility: Supine to Sit;Sit to Supine Supine to Sit: 5: Supervision;HOB flat Sitting - Scoot to Edge of Bed: 6: Modified independent (Device/Increase time) Sit to Supine: Not Tested (comment) Transfers Sit to Stand: 5: Supervision;From bed;Without upper extremity assist Stand to Sit: 6: Modified independent (Device/Increase time);To chair/3-in-1;With upper extremity assist Ambulation/Gait Ambulation/Gait Assistance: 5: Supervision Ambulation Distance (Feet): 250 Feet Assistive device: Rolling walker Ambulation/Gait Assistance Details: pt on 3 L O2 during gait...needed to stop and rest x2 while standing  but has good gait stability Gait Pattern: Shuffle;Trunk flexed Gait velocity: WNL Stairs: No Wheelchair Mobility Wheelchair Mobility: No    Shoulder Instructions     Exercises     PT Diagnosis: Difficulty walking;Generalized weakness  PT Problem List: Decreased strength;Decreased activity tolerance;Decreased balance;Decreased mobility;Cardiopulmonary status limiting activity PT Treatment Interventions: Gait training;DME instruction;Therapeutic exercise;Patient/family education   PT Goals Acute Rehab PT Goals Time For Goal Achievement: 12/22/12 Potential to Achieve Goals: Good Pt will Ambulate: >150 feet;with modified independence;with rolling walker PT Goal: Ambulate - Progress: Goal set today  Visit Information  Last PT Received On: 12/15/12    Subjective Data  Subjective: having a little trouble breathing Patient Stated Goal: return home   Prior Functioning       Cognition  Overall Cognitive Status: Appears within functional limits for tasks assessed/performed Arousal/Alertness: Awake/alert Orientation Level: Appears intact for tasks assessed Behavior During Session: [facility] for tasks performed    Extremity/Trunk Assessment Right Lower Extremity Assessment RLE ROM/Strength/Tone: [facility] for tasks assessed (has OA of hip) RLE Sensation: WFL - Light Touch RLE Coordination: WFL - gross motor Left Lower Extremity Assessment LLE ROM/Strength/Tone: WFL for tasks assessed LLE Sensation: WFL - Light Touch LLE Coordination: WFL - gross motor   Balance Balance Balance Assessed: Yes Static Standing Balance Static Standing - Balance Support: Bilateral upper extremity supported Static Standing - Level of Assistance: 6: Modified independent (Device/Increase time)  End of Session PT - End of Session Equipment Utilized During Treatment: Gait belt Activity Tolerance: Patient tolerated treatment well Patient left: in chair;with call bell/phone within reach;with chair  alarm set Nurse  Communication: Mobility status  GP     Konrad Penta 12/15/2012, 11:42 AM

## 2012-12-15 NOTE — Progress Notes (Signed)
UR Chart Review Completed  

## 2012-12-15 NOTE — Progress Notes (Signed)
[phone]

## 2012-12-15 NOTE — Progress Notes (Signed)
CRITICAL VALUE ALERT  Critical value received:  Non hemorrhagic posterior RIGHt frontal lobe infacrt  Date of notification:  12/15/2012  Time of notification:  1638  Critical value read back:yes  Nurse who received alert:  M. Corgan Mormile,RN  MD notified (1st page):  Dr Karilyn Cota  Time of first page:  1638  MD notified (2nd page):  Time of second page:  Responding MD:  Dr Karilyn Cota  Time MD responded:  [phone]

## 2012-12-16 ENCOUNTER — Encounter (HOSPITAL_COMMUNITY): Payer: Self-pay | Admitting: Oncology

## 2012-12-16 DIAGNOSIS — I6350 Cerebral infarction due to unspecified occlusion or stenosis of unspecified cerebral artery: Secondary | ICD-10-CM

## 2012-12-16 DIAGNOSIS — I5031 Acute diastolic (congestive) heart failure: Secondary | ICD-10-CM | POA: Diagnosis present

## 2012-12-16 DIAGNOSIS — C689 Malignant neoplasm of urinary organ, unspecified: Secondary | ICD-10-CM

## 2012-12-16 DIAGNOSIS — I639 Cerebral infarction, unspecified: Secondary | ICD-10-CM | POA: Diagnosis present

## 2012-12-16 DIAGNOSIS — J449 Chronic obstructive pulmonary disease, unspecified: Secondary | ICD-10-CM

## 2012-12-16 HISTORY — DX: Malignant neoplasm of urinary organ, unspecified: C68.9

## 2012-12-16 LAB — LIPID PANEL
Cholesterol: 120 mg/dL (ref 0–200)
HDL: 49 mg/dL (ref >39)
Total CHOL/HDL Ratio: 2.4 RATIO

## 2012-12-16 LAB — COMPREHENSIVE METABOLIC PANEL
AST: 30 U/L (ref 0–37)
Albumin: 2.5 g/dL — ABNORMAL LOW (ref 3.5–5.2)
Alkaline Phosphatase: 72 U/L (ref 39–117)
BUN: 49 mg/dL — ABNORMAL HIGH (ref 6–23)
Creatinine, Ser: 1.83 mg/dL — ABNORMAL HIGH (ref 0.50–1.35)
Potassium: 4.8 mEq/L (ref 3.5–5.1)
Total Protein: 5.5 g/dL — ABNORMAL LOW (ref 6.0–8.3)

## 2012-12-16 LAB — CBC
HCT: 26.8 % — ABNORMAL LOW (ref 39.0–52.0)
MCHC: 33.2 g/dL (ref 30.0–36.0)
Platelets: 412 10*3/uL — ABNORMAL HIGH (ref 150–400)
RDW: 14.9 % (ref 11.5–15.5)

## 2012-12-16 LAB — TROPONIN I: Troponin I: 0.41 ng/mL (ref <0.30)

## 2012-12-16 LAB — PRO B NATRIURETIC PEPTIDE: Pro B Natriuretic peptide (BNP): 17611 pg/mL — ABNORMAL HIGH (ref 0–450)

## 2012-12-16 MED ORDER — HYDRALAZINE HCL 20 MG/ML IJ SOLN: 10.0000 mg | Freq: Four times a day (QID) | INTRAMUSCULAR | Status: DC | PRN

## 2012-12-16 MED ORDER — ASPIRIN 300 MG RE SUPP
300.0000 mg | Freq: Every day | RECTAL | Status: DC
  Administered 2012-12-16: 300 mg via RECTAL
  Filled 2012-12-16: qty 1

## 2012-12-16 MED ORDER — STARCH (THICKENING) PO POWD
ORAL | Status: DC | PRN
  Filled 2012-12-16: qty 227

## 2012-12-16 NOTE — Progress Notes (Signed)
NAME:  Reginald Waller, Reginald Waller NO.:  [id number]  MEDICAL RECORD NO.:  [id number]  LOCATION:  IC02                          FACILITY:  [facility]  PHYSICIAN:  Ky Barban, M.D.DATE OF BIRTH:  26-Jan-1919  DATE OF PROCEDURE: DATE OF DISCHARGE:                                PROGRESS NOTE   [name] is a gentleman 79 years old.  He was just discharged after having a TUR bladder tumor last week, doing fine, having no complaints. He was afebrile.  He went home and next day, he came back to the emergency room, having hypoxia.  Chest x-ray showed that he also is known to have bladder cancer which is high-grade, invading into the muscles and causing obstruction of the left kidney.  He has severe left hydroureter nephrosis.  It also shows that he has bilateral atelectasis that could be the reason he became hypoxic.  Anyway, also, just before his discharge, they had to put the Foley catheter in because he was unable to void.  He was sent home with the catheter.  He was supposed to come and see me in the office today when I got the message that he is in the hospital.  The patient has no significant voiding difficulty in the past.  His urine absolutely clear.  Pathology report shows he had high- grade bladder urothelial cancer, invading the muscles.  I had told his wife and other family members the situation that I will get Dr. Mariel Sleet to see if anything he can offer to this patient who is elderly, has multiple other problems.  Tomorrow, if he is like this, I will take his catheter out and see if he can void.  Otherwise, I will put the catheter back in and then I have to cystoscope again and see if he has any bladder neck obstruction.  He has not developed any CVA during this time.  He is afebrile, having no significant complaints at this point.  His WBC count today it is 12.9, on December 12, 2012, it was 9.3.  His hematocrit is 27.8, today it is 28.6.  His urine shows no growth  on December 04, 2012.     Ky Barban, M.D.     MIJ/MEDQ  D:  12/15/2012  T:  12/16/2012  Job:  [id number]

## 2012-12-16 NOTE — Evaluation (Signed)
Physical Therapy Evaluation Patient Details Name: Reginald Waller MRN: [id number] DOB: 09/23/1919 Today's Date: 12/16/2012 Time: 1191-4782 PT Time Calculation (min): 30 min  PT Assessment / Plan / Recommendation Clinical Impression  Pt mobilitiy has decreased significantly with stroke.  Pt has decreased strength and balance who will benefit from acute and SNF level.    PT Assessment  Patient needs continued PT services    Follow Up Recommendations  SNF    Does the patient have the potential to tolerate intense rehabilitation    no  Barriers to Discharge  none      Equipment Recommendations  None recommended by PT    Recommendations for Other Services     Frequency Min 5X/week    Precautions / Restrictions Precautions Precautions: Fall Restrictions Weight Bearing Restrictions: No         mobility  Bed Mobility Supine to Sit: 3: Mod assist Sitting - Scoot to Edge of Bed: 3: Mod assist Sit to Supine: 2: Max assist Transfers Transfers: Sit to Stand Sit to Stand: 1: +2 Total assist Stand to Sit: 3: Mod assist Ambulation/Gait Ambulation/Gait Assistance: Not tested (comment)           PT Diagnosis: Difficulty walking;Generalized weakness;Hemiplegia non-dominant side  PT Problem List: Decreased strength;Decreased balance;Decreased mobility PT Treatment Interventions: Functional mobility training;Therapeutic activities;Therapeutic exercise;Balance training   PT Goals Acute Rehab PT Goals PT Goal Formulation: With patient/family Time For Goal Achievement: 12/19/12 Potential to Achieve Goals: Good Pt will go Supine/Side to Sit: with min assist PT Goal: Supine/Side to Sit - Progress: Goal set today Pt will Sit at Edge of Bed: with modified independence;3-5 min PT Goal: Sit at Edge Of Bed - Progress: Goal set today Pt will go Sit to Supine/Side: with mod assist PT Goal: Sit to Supine/Side - Progress: Goal set today Pt will go Sit to Stand: with max assist PT  Goal: Sit to Stand - Progress: Goal set today Pt will go Stand to Sit: with mod assist PT Goal: Stand to Sit - Progress: Goal set today Pt will Stand: with min assist;1 - 2 min PT Goal: Stand - Progress: Goal set today Pt will Ambulate: 1 - 15 feet;with max assist;with rolling walker PT Goal: Ambulate - Progress: Goal set today  Visit Information  Last PT Received On: 12/16/12    Subjective Data  Subjective: Pt states he was doing well until yesterday.states he is having trouble lifting his L arm Patient Stated Goal: To be stronger   Prior Functioning  Home Living Lives With: Spouse Available Help at Discharge: Family Type of Home: House Home Access: Level entry Home Layout: One level Firefighter: Standard Home Adaptive Equipment: Straight cane (wife states he has been using his walker here lately) Prior Function Level of Independence: Independent with assistive device(s) Vocation: Retired Musician: No difficulties Dominant Hand: Right    Cognition       Extremity/Trunk Assessment Left Lower Extremity Assessment LLE ROM/Strength/Tone: Deficits LLE ROM/Strength/Tone Deficits: strength generally 2/5 for hip; 3+ for knee   Balance Balance Balance Assessed: Yes Static Sitting Balance Static Sitting - Balance Support: Right upper extremity supported Static Sitting - Level of Assistance: 5: Stand by assistance (loses balance x 3 x during 5 min trial) Static Standing Balance Static Standing - Level of Assistance: 1: +2 Total assist  End of Session PT - End of Session Equipment Utilized During Treatment: Gait belt Activity Tolerance: Patient limited by fatigue Patient left: in bed;with call bell/phone within reach;with  nursing in room  GP     RUSSELL,CINDY 12/16/2012, 4:02 PM

## 2012-12-16 NOTE — Evaluation (Signed)
Clinical/Bedside Swallow Evaluation Patient Details  Name: Reginald Waller MRN: [id number] Date of Birth: June 11, 1919  Today's Date: 12/16/2012 Time: 1530-1600 SLP Time Calculation (min): 30 min  Past Medical History:  Past Medical History  Diagnosis Date  . Hypertension   . Arthritis   . Stroke   . Malaria   . Urothelial cancer 12/16/2012   Past Surgical History:  Past Surgical History  Procedure Date  . Hernia repair   . Cataract extraction   . Appendectomy   . Eye surgery   . Cystoscopy 12/08/2012    Procedure: CYSTOSCOPY FLEXIBLE;  Surgeon: Ky Barban, MD;  Location: [facility];  Service: Urology;  Laterality: N/A;  . Transurethral resection of bladder tumor 12/09/2012    Procedure: TRANSURETHRAL RESECTION OF BLADDER TUMOR (TURBT);  Surgeon: Ky Barban, MD;  Location: [facility];  Service: Urology;  Laterality: N/A;   HPI:  Reginald Waller is a 79 y.o. male who was discharged Sunday (TURPT by Dr. Jerre Simon). He went home, he was supposed to have a home oxygen delivered but did not and just after midnight last night, he became short of breath. He presented to the emergency room with dyspnea. He has had no fever. Shortly after admission he had a change in status and MRI showed acute non-hemorrhagic posterior right frontal lobe small infarct. He was having difficulty managing his secretions yesterday, but says he is hungry today. He has had some water today.   Assessment / Plan / Recommendation Clinical Impression  Functionally moderate oral phase dysphagia with suspected pharyngeal component characterized by weak oral sling with weakness on left side and difficulty containing and propelling bolus posteriorly. Poor lip closure resulting in loss of bolus anteriorly. Although pt did not have any overt coughing, pharyngeal swallow lacked coordination and pt presented with intermittent wet vocal quallity after impulsive sequential swallows of thin via straw sips. Recommend D2 with  nectar-thick liquids with strict use of aspiration precautions. SLP to re-eval (likely with MBSS) on Thursday.  In addition to dysphagia therapy and follow up, pt will also need cognitive/linguistic evaluation and treatment for dysarthria and possible cognitive deficits (impulsivity, self monitoring).   Aspiration Risk  Moderate    Diet Recommendation Dysphagia 2 (Fine chop);Nectar-thick liquid   Liquid Administration via: Cup Medication Administration: Crushed with puree Supervision: Full supervision/cueing for compensatory strategies (feeder assist, watch for impulsivity) Compensations: Slow rate;Small sips/bites;Check for pocketing;Check for anterior loss;Follow solids with liquid Postural Changes and/or Swallow Maneuvers: Seated upright 90 degrees;Upright 30-60 min after meal    Other  Recommendations Recommended Consults: MBS Oral Care Recommendations: Oral care BID;Staff/trained caregiver to provide oral care Other Recommendations: Order thickener from pharmacy;Clarify dietary restrictions   Follow Up Recommendations  Inpatient Rehab;Skilled Nursing facility    Frequency and Duration min 3x week  1 week       SLP Swallow Goals Patient will consume recommended diet without observed clinical signs of aspiration with: Minimal assistance Patient will utilize recommended strategies during swallow to increase swallowing safety with: Minimal assistance   Swallow Study Prior Functional Status  Type of Home: House Lives With: Spouse Available Help at Discharge: Family Vocation: Retired    General Date of Onset: 12/15/12 HPI: Reginald Waller is a 79 y.o. male who was discharged Sunday (TURPT by Dr. Jerre Simon). He went home, he was supposed to have a home oxygen delivered but did not and just after midnight last night, he became short of breath. He presented to the emergency room with  dyspnea. He has had no fever. Shortly after admission he had a change in status and MRI showed acute  non-hemorrhagic posterior right frontal lobe small infarct. He was having difficulty managing his secretions yesterday, but says he is hungry today. He has had some water today. Type of Study: Bedside swallow evaluation Diet Prior to this Study: NPO Temperature Spikes Noted: No Respiratory Status: Supplemental O2 delivered via (comment) History of Recent Intubation: No Behavior/Cognition: Alert;Cooperative;Pleasant mood;Hard of hearing Oral Cavity - Dentition: Dentures, bottom (ill-fitting upper dentures removed) Self-Feeding Abilities: Needs assist Patient Positioning: Other (comment) (seen sitting on edge of bed with PT) Baseline Vocal Quality: Clear Volitional Cough: Strong;Congested Volitional Swallow: Able to elicit    Oral/Motor/Sensory Function Overall Oral Motor/Sensory Function: Impaired Labial ROM: Reduced left Labial Symmetry: Abnormal symmetry left Labial Strength: Reduced Labial Sensation: Reduced Lingual ROM: Reduced left Lingual Symmetry: Abnormal symmetry left Lingual Strength: Reduced Lingual Sensation: Reduced Facial ROM: Reduced left Facial Symmetry: Left droop Facial Strength: Reduced Velum:  (could not visualize) Mandible: Within Functional Limits   Ice Chips Ice chips: Impaired Presentation: Spoon Oral Phase Impairments: Reduced labial seal;Reduced lingual movement/coordination;Impaired anterior to posterior transit Oral Phase Functional Implications: Left anterior spillage   Thin Liquid Thin Liquid: Impaired Presentation: Cup;Straw Oral Phase Impairments: Reduced labial seal;Impaired anterior to posterior transit Oral Phase Functional Implications: Left anterior spillage Pharyngeal  Phase Impairments: Suspected delayed Swallow;Wet Vocal Quality    Nectar Thick Nectar Thick Liquid: Within functional limits Presentation: Cup;Straw   Honey Thick Honey Thick Liquid: Not tested   Puree Puree: Impaired Presentation: Spoon Oral Phase Impairments: Reduced  lingual movement/coordination;Impaired anterior to posterior transit Oral Phase Functional Implications: Left lateral sulci pocketing;Prolonged oral transit;Oral residue   Solid     Thank you,  Havery Moros, CCC-SLP [phone]    Solid: Impaired Presentation: Spoon Oral Phase Impairments: Reduced lingual movement/coordination;Impaired anterior to posterior transit Oral Phase Functional Implications: Left lateral sulci pocketing;Oral residue       PORTER,DABNEY 12/16/2012,5:14 PM

## 2012-12-16 NOTE — Consult Note (Signed)
[facility] Consultation Oncology  Name: LUCILLE CRICHLOW      MRN: [id number]    Location: IC02/IC02-01  Date: 12/16/2012 Time:2:06 PM   REFERRING PHYSICIAN:  Crista Curb, MD  REASON FOR CONSULT:  High Grade Uorthelial Cancer   DIAGNOSIS:  High Grade Urothelial Cancer  HISTORY OF PRESENT ILLNESS:   This is a 79 year old Caucasian man who presented to the ED after being discharged with SOB.  Mr. Savini has a past medical history significant for HTN and hypothyroidism.  The patient's story is the following: The patient presented to the ED on 12/04/2012 for abdominal pain, back pain, and constipation.  All of which were worsening.  While in the ED, he developed severe pain and a CT scan was completed revealing a bladder mass, compressing on the ureter and causing the secondary hydronephrosis that was demonstrated.  The patient was admitted and Urologist was properly consulted.  The patient was seen by Dr. Jerre Simon who performed a cystoscopy on 12/08/12 and a TURBT the following day on 12/09/12.  Biopsy results revealed a high grade urothelial carcinoma involving the muscularis propria.  Following this, the patient had difficulty voiding and therefore a Foley Catheter was placed.  Hemoglobin-stained urine was appreciated.  During this hospitalization, the patient was noted to have a left arm cellulitis and was negative for DVT on doppler studies.  The patient was discharged on 12/12/2012 with home oxygen.  Unfortunately, the home oxygen was not delivered and the patient become SOB and re-presented to the ED the following day on 12/13/2012 with increased dyspnea.  He was subsequently admitted and his hospital course was fairly uneventful until 12/15/2012 when he was exhibiting signs of left sided weakness, left facial droop with facial asymmetry, slurred speech.  He was diagnosed with an acute, nonhemorrhagic, posterior right frontal lobe small infarct by MRI.  Oncology was consulted for his newly  diagnosed high-grade, urothelial carcinoma involving the muscularis propria.  The patient is seen in ICU2 with his wife, Corrie Dandy at the bedside.  Justine is very vocal and is noted to have slurred speech.  Majority of his speech is recognizable and discernable, but there are times I am unable to understand what is saying. 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  He validates the story I dictate above.  His number one complaint is that he is hungry and he has not been fed.  Unfortunately, he is NPO until speech pathology can evaluate the patient for his ability to swallow.  I educated the patient on the need to continue IV fluids and he can go a number of days without food.  Hopefully his ability to swallow can be evaluated soon.  The patient denies any complaints otherwise, but wishes to speak about a host of other things including his time in the war, etc.  I spent some time with the patient today and his wife discussing his diagnosis of high grade urothelial carcinoma.  It has not invaded through the muscularis propria and he therefore has stage II disease.  Due to his age and other comorbidities, the patient is not a chemotherapy candidate.  It is reasonable for the patient to be seen by radiation oncologist for his/her opinion regarding the role of radiation in this patient's care.  At this time, we will see how well the patient recovers from his stroke and near time of discharge, the patient should have a radiation oncology  consultation arranged.    The patient denies any headaches, dizziness, double vision, fevers, chills, night sweats, nausea, vomiting, diarrhea, constipation, chest pain, heart palpitations, blood in stool, black tarry stool, urinary pain/burning/frequency.  He does note that his abdominal pain and back pain are much improved compared to his initial diagnosis.    PAST MEDICAL HISTORY:   Past Medical History  Diagnosis  Date  . Hypertension   . Arthritis   . Stroke   . Malaria     ALLERGIES: Allergies  Allergen Reactions  . Shellfish Allergy Other (See Comments)    Swelling of throat and tongue.      MEDICATIONS: I have reviewed the patient's current medications.     PAST SURGICAL HISTORY Past Surgical History  Procedure Date  . Hernia repair   . Cataract extraction   . Appendectomy   . Eye surgery   . Cystoscopy 12/08/2012    Procedure: CYSTOSCOPY FLEXIBLE;  Surgeon: Ky Barban, MD;  Location: [facility];  Service: Urology;  Laterality: N/A;  . Transurethral resection of bladder tumor 12/09/2012    Procedure: TRANSURETHRAL RESECTION OF BLADDER TUMOR (TURBT);  Surgeon: Ky Barban, MD;  Location: [facility];  Service: Urology;  Laterality: N/A;    FAMILY HISTORY: Family History  Problem Relation Age of Onset  . Diabetes Mother   . Diabetes Father   . Cancer Sister     breast  . Cancer Sister     thyroid  . Cancer Brother     leukemia  . Cancer Brother   . Coronary artery disease Brother     SOCIAL HISTORY:  reports that he has never smoked. He does not have any smokeless tobacco history on file. He reports that he does not drink alcohol or use illicit drugs.  PERFORMANCE STATUS: The patient's performance status is 3 - Symptomatic, >50% confined to bed  PHYSICAL EXAM: Most Recent Vital Signs: Blood pressure 77/62, pulse 87, temperature 98.8 F (37.1 C), temperature source Oral, resp. rate 16, height 5\' 11"  (1.803 m), weight 170 lb 6.7 oz (77.3 kg), SpO2 91.00%. General appearance: alert, cooperative, appears stated age, no distress and dysarthria, hungry Head: Left facial droop with facial asymmetry Eyes: negative findings: lids and lashes normal and conjunctivae and sclerae normal Throat: normal findings: lips normal without lesions Neck: no adenopathy and supple, symmetrical, trachea midline Lungs: clear to auscultation bilaterally Chest wall: no tenderness Heart:  regular rate and rhythm, S1, S2 normal, no murmur, click, rub or gallop Abdomen: soft, non-tender; bowel sounds normal; no masses,  no organomegaly Extremities: left sided weakness.  Left arm strength is 3/5 Skin: thin skin Lymph nodes: Cervical, supraclavicular, and axillary nodes normal. Neurologic: Left sided weakness  LABORATORY DATA:  Results for orders placed during the hospital encounter of 12/13/12 (from the past 48 hour(s))  CBC     Status: Abnormal   Collection Time   12/15/12  3:43 AM      Component Value Range Comment   WBC 12.9 (*) 4.0 - 10.5 K/uL    RBC 3.08 (*) 4.22 - 5.81 MIL/uL    Hemoglobin 9.2 (*) 13.0 - 17.0 g/dL    HCT 16.1 (*) 09.6 - 52.0 %    MCV 92.9  78.0 - 100.0 fL    MCH 29.9  26.0 - 34.0 pg    MCHC 32.2  30.0 - 36.0 g/dL    RDW 04.5  40.9 - 81.1 %    Platelets 502 (*) 150 - 400 K/uL  BASIC METABOLIC PANEL     Status: Abnormal   Collection Time   12/15/12  3:43 AM      Component Value Range Comment   Sodium 140  135 - 145 mEq/L    Potassium 4.5  3.5 - 5.1 mEq/L    Chloride 108  96 - 112 mEq/L    CO2 21  19 - 32 mEq/L    Glucose, Bld 112 (*) 70 - 99 mg/dL    BUN 49 (*) 6 - 23 mg/dL    Creatinine, Ser 3.08 (*) 0.50 - 1.35 mg/dL    Calcium 8.6  8.4 - 65.7 mg/dL    GFR calc non Af Amer 29 (*) >90 mL/min    GFR calc Af Amer 34 (*) >90 mL/min   BLOOD GAS, ARTERIAL     Status: Abnormal   Collection Time   12/15/12  2:55 PM      Component Value Range Comment   O2 Content 3.0      pH, Arterial 7.440  7.350 - 7.450    pCO2 arterial 27.6 (*) 35.0 - 45.0 mmHg    pO2, Arterial 72.8 (*) 80.0 - 100.0 mmHg    Bicarbonate 18.4 (*) 20.0 - 24.0 mEq/L    TCO2 16.5  0 - 100 mmol/L    Acid-base deficit 5.0 (*) 0.0 - 2.0 mmol/L    O2 Saturation 94.9      Patient temperature 37.0      Collection site RIGHT RADIAL      Drawn by COLLECTED BY RT      Sample type ARTERIAL      Allens test (pass/fail) PASS  PASS   URINALYSIS, ROUTINE W REFLEX MICROSCOPIC     Status:  Abnormal   Collection Time   12/15/12  4:10 PM      Component Value Range Comment   Color, Urine YELLOW  YELLOW    APPearance CLEAR  CLEAR    Specific Gravity, Urine 1.020  1.005 - 1.030    pH 5.5  5.0 - 8.0    Glucose, UA NEGATIVE  NEGATIVE mg/dL    Hgb urine dipstick LARGE (*) NEGATIVE    Bilirubin Urine NEGATIVE  NEGATIVE    Ketones, ur NEGATIVE  NEGATIVE mg/dL    Protein, ur 846 (*) NEGATIVE mg/dL    Urobilinogen, UA 0.2  0.0 - 1.0 mg/dL    Nitrite NEGATIVE  NEGATIVE    Leukocytes, UA TRACE (*) NEGATIVE   MRSA PCR SCREENING     Status: Normal   Collection Time   12/15/12  4:10 PM      Component Value Range Comment   MRSA by PCR NEGATIVE  NEGATIVE   URINE MICROSCOPIC-ADD ON     Status: Abnormal   Collection Time   12/15/12  4:10 PM      Component Value Range Comment   WBC, UA 7-10  <3 WBC/hpf    RBC / HPF TOO NUMEROUS TO COUNT  <3 RBC/hpf    Bacteria, UA MANY (*) RARE   TROPONIN I     Status: Abnormal   Collection Time   12/15/12  4:13 PM      Component Value Range Comment   Troponin I 0.55 (*) <0.30 ng/mL   TROPONIN I     Status: Abnormal   Collection Time   12/15/12 10:12 PM      Component Value Range Comment   Troponin I 0.51 (*) <0.30 ng/mL   TROPONIN I     Status:  Abnormal   Collection Time   12/16/12  4:15 AM      Component Value Range Comment   Troponin I 0.41 (*) <0.30 ng/mL   LIPID PANEL     Status: Normal   Collection Time   12/16/12  4:15 AM      Component Value Range Comment   Cholesterol 120  0 - 200 mg/dL    Triglycerides 63  <409 mg/dL    HDL 49  >81 mg/dL    Total CHOL/HDL Ratio 2.4      VLDL 13  0 - 40 mg/dL    LDL Cholesterol 58  0 - 99 mg/dL   CBC     Status: Abnormal   Collection Time   12/16/12  4:16 AM      Component Value Range Comment   WBC 7.5  4.0 - 10.5 K/uL    RBC 2.90 (*) 4.22 - 5.81 MIL/uL    Hemoglobin 8.9 (*) 13.0 - 17.0 g/dL    HCT 19.1 (*) 47.8 - 52.0 %    MCV 92.4  78.0 - 100.0 fL    MCH 30.7  26.0 - 34.0 pg    MCHC 33.2   30.0 - 36.0 g/dL    RDW 29.5  62.1 - 30.8 %    Platelets 412 (*) 150 - 400 K/uL   COMPREHENSIVE METABOLIC PANEL     Status: Abnormal   Collection Time   12/16/12  4:16 AM      Component Value Range Comment   Sodium 138  135 - 145 mEq/L    Potassium 4.8  3.5 - 5.1 mEq/L    Chloride 106  96 - 112 mEq/L    CO2 22  19 - 32 mEq/L    Glucose, Bld 177 (*) 70 - 99 mg/dL    BUN 49 (*) 6 - 23 mg/dL    Creatinine, Ser 6.57 (*) 0.50 - 1.35 mg/dL    Calcium 8.4  8.4 - 84.6 mg/dL    Total Protein 5.5 (*) 6.0 - 8.3 g/dL    Albumin 2.5 (*) 3.5 - 5.2 g/dL    AST 30  0 - 37 U/L    ALT 21  0 - 53 U/L    Alkaline Phosphatase 72  39 - 117 U/L    Total Bilirubin 0.4  0.3 - 1.2 mg/dL    GFR calc non Af Amer 31 (*) >90 mL/min    GFR calc Af Amer 35 (*) >90 mL/min   PRO B NATRIURETIC PEPTIDE     Status: Abnormal   Collection Time   12/16/12  4:16 AM      Component Value Range Comment   Pro B Natriuretic peptide (BNP) 17611.0 (*) 0 - 450 pg/mL       RADIOGRAPHY: Mr Brain Wo Contrast  12/15/2012  *RADIOLOGY REPORT*  Clinical Data: Altered mental status.  History of prior stroke.  MRI HEAD WITHOUT CONTRAST  Technique:  Multiplanar, multiecho pulse sequences of the brain and surrounding structures were obtained according to standard protocol without intravenous contrast.  Comparison: None.  Findings: Motion degraded exam.  Acute non hemorrhagic posterior right frontal lobe small infarct. This appears slightly rounded rather than wedge-shaped and if the patient had any progressive symptoms, follow-up imaging to confirm that this represents an infarct may be considered.  Prominent small vessel disease type changes.  Global atrophy.  Ventricular prominence felt to be related atrophy rather than hydrocephalus.  No intracranial hemorrhage.  No intracranial mass lesion  detected on this unenhanced exam.  Major intracranial vascular structures are patent.  Prominent panus C1-2 level.  IMPRESSION:  Motion degraded exam.   Acute non hemorrhagic posterior right frontal lobe small infarct.  Prominent small vessel disease type changes.  This has been made a PRA call report utilizing dashboard call feature.   Original Report Authenticated By: Lacy Duverney, M.D.    Dg Chest Port 1 View  12/15/2012  *RADIOLOGY REPORT*  Clinical Data: Shortness of breath.  PORTABLE CHEST - 1 VIEW  Comparison: 12/13/2012.  Findings: Patient is rotated.  Heart size is grossly stable.  There is bibasilar dependent air space disease and bilateral pleural effusions.  Findings may be superimposed on chronic lung disease. Probable slight overall worsening from 12/14/2011.  IMPRESSION: Bibasilar predominant air space disease and bilateral pleural effusions, slightly worsened from 12/13/2012.   Original Report Authenticated By: Leanna Battles, M.D.        PATHOLOGY:   12/09/12  Diagnosis 1. Bladder, transurethral resection, base of bladder tumor - INVASIVE HIGH GRADE UROTHELIAL CARCINOMA, INVOLVING THE MUSCULARIS PROPRIA. 2. Bladder, transurethral resection, bladder tumor - INVASIVE HIGH GRADE UROTHELIAL CARCINOMA INVOLVING THE MUSCULARIS PROPRIA. Abigail Miyamoto MD Pathologist, Electronic Signature (Case signed 12/10/2012)    ASSESSMENT:  1. High grade, urothelial cancer invading the muscular propria, S/P TURBT on 12/09/2012 by Dr. Jerre Simon. 2. Acute non-hemorrhagic posterior right frontal lobe small infarct causing left sided weakness, dysarthria, and left facial droop. 3. Severe mitral annular calcification, per CT 4. Dehydration, BUN: Creatinine ration is > 20 at 26.7 5. Anemia, likely secondary to acute blood loss +/- heart failure +/- comorbidities.    6. Minimal thrombocytosis at 412,000.  No intervention required. 7. Acute diastolic heart failure, BNP > 04,540 8. Elevated troponin 9. Left arm cellulitis 10. Left sided weakeness   PLAN:  1. Will order anemia panel, although anemia is likely secondary to acute blood loss +/- heart  failure +/- comorbidities.  2. Patient is not a chemotherapy candidate for his Stage II, high-Grade Urothelial Carcinoma.   3. Recommend consultation with radiation oncology to evaluate the role for radiation therapy.   4. Continue supportive care.  5. Will continue to follow while an inpatient.   All questions were answered. The patient knows to call the clinic with any problems, questions or concerns. We can certainly see the patient much sooner if necessary.  The patient and plan discussed with Glenford Peers, MD and he is in agreement with the aforementioned.  KEFALAS,THOMAS

## 2012-12-16 NOTE — Progress Notes (Signed)
Pt has had a new stroke.  Because of change in status, we will need to d/c PT orders.  Please reconsult when felt appropriate.  Thank you.

## 2012-12-16 NOTE — Progress Notes (Addendum)
[facility] PROGRESS NOTE  Reginald Waller ZOX:[id number] DOB: May 02, 1919 DOA: 12/13/2012 PCP: Reginald Ruths, MD  Assessment/Plan:  Acute non hemorrhagic posterior right frontal lobe small infarct TPA not ordered do to recent surgical procedure and hematuria Residual dysarthria, and left-sided weakness Checking lipid panel, 2-D echo previously done (1/14) Currently n.p.o., was having trouble managing secretions yesterday. Speech therapy consulted Started on daily full strength aspirin, (PR for now) Reconsult physical therapy, and occupational therapy to determine disposition. Anticipate he will need skilled nursing rehabilitation. Discussed with niece Reginald Waller. Will check fasting lipid profile. Hold off on carotid Dopplers for now, as it is unclear that patient would be a candidate for carotid endarterectomy with high-grade bladder cancer. Await guidance from Dr. Mariel Waller regarding prognosis and potential treatment.  High-grade bladder cancer with muscle involvement Recent TURBT by Dr. Jerre Waller Residual urinary retention, Foley catheter in place Hematuria appears resolved today. Oncology consulted for discussion of potential treatment options and prognosis. Certainly, patient will need to be stabilized from a stroke perspective prior to the institution of treatment, but this may guide how the stroke is treated and worked up depending on his prognosis.  Acute diastolic heart failure On January 20 patient was observed to have fluid overload evidenced by pitting edema and shortness of breath/respiratory distress. Improved today. Suspect more related to fluid overload and bronchospasm. Significant improvement on January 21. Respiratory distress resolved. IV steroids discontinued. Recent echo 1/13/ 2014 showed LVEF of 60-65% with mild concentric hypertrophy. BNP 17,611 on January 21 IV Lasix 40 mg twice a day started January/20th.  Elevated troponin Troponins were cycled during the  patient's CVA event Peak was 0.55 EKG unchanged from previous. No evidence of acute coronary syndrome. Likely related to renal insufficiency and acute on chronic diastolic heart failure. Creatinine is mildly elevated over baseline as well which could have contributed to elevated troponin  Elevated creatinine Recent baseline creatinine 1.62 Nephrotoxic medications have been discontinued, will monitor vancomycin closely  Left arm cellulitis Originally caused by IV infiltration Doppler ultrasound negative for DVT Appears to be improving since vancomycin started. Will discontinue levofloxacin.  History of Iron deficiency Anemia Related to recent hematuria as well as chronic kidney disease Hemoglobin remained stable at approximately 9. No anemia panel since 2009. Will repeat.  Code Status: DNR Reginald Waller Family Communication:   Niece Reginald Waller at bedside Disposition Plan: Likely SNF.   PT / OT re-consulted.   Consultants:  Urology  Oncology  Procedures:    Antibiotics:  Levaquin previous admission -> 12/16/12  Vancomycin 12/15/2012  HPI/Subjective: Complaining of left leg pain.    Objective: Filed Vitals:   12/16/12 0626 12/16/12 0700 12/16/12 0701 12/16/12 0822  BP:  104/43    Pulse:      Temp:      TempSrc:      Resp: 13 13 12    Height:      Weight:      SpO2:    94%    Intake/Output Summary (Last 24 hours) at 12/16/12 0941 Last data filed at 12/16/12 0700  Gross per 24 hour  Intake    929 ml  Output   3625 ml  Net  -2696 ml   Filed Weights   12/14/12 0520 12/15/12 1600 12/16/12 0500  Weight: 79.5 kg (175 lb 4.3 oz) 79 kg (174 lb 2.6 oz) 77.3 kg (170 lb 6.7 oz)    Exam:   General:  Alert, awake, no apparent distress  Cardiovascular: Regular rate and rhythm, no murmurs rubs or  gallops  Respiratory: Slight anterior wheeze, no accessory muscle movement  Abdomen: Soft, slight distention, nontender, no masses, positive bowel sounds  Neuro:  Very hard of  hearing, new findings: Slight tongue deviation left, significant dysarthria that started 1/20, slight left-sided facial droop, left sided extremity weakness, left upper extremity 2/6, left lower extremity 3/6.  Skin: Swelling and erythema in left upper extremity slightly improved, noticed new erythema on the medial side of the biceps area in right upper extremity  Data Reviewed: Basic Metabolic Panel:  Lab 12/16/12 1610 12/15/12 0343 12/14/12 0650 12/13/12 0541 12/12/12 0406  NA 138 140 136 138 136  K 4.8 4.5 4.8 4.9 4.6  CL 106 108 105 107 107  CO2 22 21 19 20 22   GLUCOSE 177* 112* 137* 114* 89  BUN 49* 49* 36* 33* 31*  CREATININE 1.83* 1.90* 1.72* 1.92* 1.76*  CALCIUM 8.4 8.6 8.6 8.5 8.5  MG -- -- -- -- --  PHOS -- -- -- -- --   Liver Function Tests:  Lab 12/16/12 0416 12/14/12 0650  AST 30 26  ALT 21 16  ALKPHOS 72 78  BILITOT 0.4 0.3  PROT 5.5* 5.7*  ALBUMIN 2.5* 2.4*   CBC:  Lab 12/16/12 0416 12/15/12 0343 12/14/12 0650 12/13/12 0541 12/12/12 0406  WBC 7.5 12.9* 5.9 11.1* 9.3  NEUTROABS -- -- -- 8.0* --  HGB 8.9* 9.2* 9.0* 8.9* 9.1*  HCT 26.8* 28.6* 27.8* 27.2* 27.8*  MCV 92.4 92.9 93.0 93.5 93.9  PLT 412* 502* 419* 397 395   Cardiac Enzymes:  Lab 12/16/12 0415 12/15/12 2212 12/15/12 1613  CKTOTAL -- -- --  CKMB -- -- --  CKMBINDEX -- -- --  TROPONINI 0.41* 0.51* 0.55*   BNP (last 3 results)  Basename 12/16/12 0416 12/11/12 0404 12/08/12 0534  PROBNP 17611.0* 6791.0* 16208.0*   CBG: No results found for this basename: GLUCAP:5 in the last 168 hours  Recent Results (from the past 240 hour(s))  MRSA PCR SCREENING     Status: Normal   Collection Time   12/08/12 11:07 AM      Component Value Range Status Comment   MRSA by PCR NEGATIVE  NEGATIVE Final   MRSA PCR SCREENING     Status: Normal   Collection Time   12/15/12  4:10 PM      Component Value Range Status Comment   MRSA by PCR NEGATIVE  NEGATIVE Final      Studies: Mr Brain Wo  Contrast  12/15/2012  *RADIOLOGY REPORT*  Clinical Data: Altered mental status.  History of prior stroke.  MRI HEAD WITHOUT CONTRAST  Technique:  Multiplanar, multiecho pulse sequences of the brain and surrounding structures were obtained according to standard protocol without intravenous contrast.  Comparison: None.  Findings: Motion degraded exam.  Acute non hemorrhagic posterior right frontal lobe small infarct. This appears slightly rounded rather than wedge-shaped and if the patient had any progressive symptoms, follow-up imaging to confirm that this represents an infarct may be considered.  Prominent small vessel disease type changes.  Global atrophy.  Ventricular prominence felt to be related atrophy rather than hydrocephalus.  No intracranial hemorrhage.  No intracranial mass lesion detected on this unenhanced exam.  Major intracranial vascular structures are patent.  Prominent panus C1-2 level.  IMPRESSION:  Motion degraded exam.  Acute non hemorrhagic posterior right frontal lobe small infarct.  Prominent small vessel disease type changes.  This has been made a PRA call report utilizing dashboard call feature.   Original Report  Authenticated By: Lacy Duverney, M.D.    Dg Chest Port 1 View  12/15/2012  *RADIOLOGY REPORT*  Clinical Data: Shortness of breath.  PORTABLE CHEST - 1 VIEW  Comparison: 12/13/2012.  Findings: Patient is rotated.  Heart size is grossly stable.  There is bibasilar dependent air space disease and bilateral pleural effusions.  Findings may be superimposed on chronic lung disease. Probable slight overall worsening from 12/14/2011.  IMPRESSION: Bibasilar predominant air space disease and bilateral pleural effusions, slightly worsened from 12/13/2012.   Original Report Authenticated By: Leanna Battles, M.D.     Scheduled Meds:    . albuterol  2.5 mg Nebulization Q4H  . antiseptic oral rinse  15 mL Mouth Rinse BID  . aspirin  300 mg Rectal Daily  . furosemide  40 mg Intravenous  Q12H  . heparin  5,000 Units Subcutaneous Q8H  . latanoprost  1 drop Both Eyes QHS  . levothyroxine  75 mcg Oral QAC breakfast  . vancomycin  1,000 mg Intravenous Q24H   Continuous Infusions:    . sodium chloride 20 mL/hr at 12/14/12 1610    Principal Problem:  *Atelectasis Active Problems:  Bladder cancer  Acute renal failure  Hypertension  CVA (cerebral vascular accident)  Acute diastolic heart failure    Time spent: 40 min.    Stephani Police  [facility] Pager [phone]. If 8PM-8AM, please contact night-coverage at [email or web address], password [facility] 12/16/2012, 9:41 AM  LOS: 3 days    Chart reviewed. Patient interviewed and examined with Ms. Elyn Peers. Agree. Changes made in bold. Transfer to step down. Patient had an echocardiogram earlier in the month. Hold off on carotid Dopplers, as patient likely not a candidate for carotid endarterectomy. Await speech therapy recommendations regarding diet. Will also need language therapy for dysarthria. Aspirin PR for now. Patient did not get TPA due to recent TURBT. PT OT and social work consult for likely placement. Will get oncology's opinion regarding eventual treatment options and prognosis. Will need to be stabilized neurologically prior to institution of any treatment, of course.

## 2012-12-17 ENCOUNTER — Inpatient Hospital Stay (HOSPITAL_COMMUNITY): Payer: Medicare Other

## 2012-12-17 DIAGNOSIS — D649 Anemia, unspecified: Secondary | ICD-10-CM

## 2012-12-17 LAB — CBC
MCH: 30.3 pg (ref 26.0–34.0)
MCHC: 32.9 g/dL (ref 30.0–36.0)
MCV: 92.2 fL (ref 78.0–100.0)
Platelets: 462 10*3/uL — ABNORMAL HIGH (ref 150–400)
RDW: 14.9 % (ref 11.5–15.5)

## 2012-12-17 LAB — RETICULOCYTES
RBC.: 3.07 MIL/uL — ABNORMAL LOW (ref 4.22–5.81)
Retic Count, Absolute: 79.8 10*3/uL (ref 19.0–186.0)
Retic Ct Pct: 2.6 % (ref 0.4–3.1)

## 2012-12-17 LAB — VITAMIN B12: Vitamin B-12: 567 pg/mL (ref 211–911)

## 2012-12-17 LAB — IRON AND TIBC: Saturation Ratios: 7 % — ABNORMAL LOW (ref 20–55)

## 2012-12-17 LAB — URINE CULTURE: Colony Count: NO GROWTH

## 2012-12-17 LAB — BASIC METABOLIC PANEL
Calcium: 8.4 mg/dL (ref 8.4–10.5)
Creatinine, Ser: 1.77 mg/dL — ABNORMAL HIGH (ref 0.50–1.35)
GFR calc Af Amer: 37 mL/min — ABNORMAL LOW (ref >90)
GFR calc non Af Amer: 32 mL/min — ABNORMAL LOW (ref >90)

## 2012-12-17 MED ORDER — GABAPENTIN 300 MG PO CAPS
300.0000 mg | ORAL_CAPSULE | Freq: Once | ORAL | Status: AC
  Administered 2012-12-17: 300 mg via ORAL
  Filled 2012-12-17: qty 1

## 2012-12-17 MED ORDER — ASPIRIN EC 325 MG PO TBEC
325.0000 mg | DELAYED_RELEASE_TABLET | Freq: Every day | ORAL | Status: DC
  Administered 2012-12-17: 325 mg via ORAL
  Filled 2012-12-17: qty 1

## 2012-12-17 MED ORDER — MORPHINE SULFATE 2 MG/ML IJ SOLN
2.0000 mg | INTRAMUSCULAR | Status: DC | PRN
  Administered 2012-12-17: 2 mg via INTRAVENOUS
  Filled 2012-12-17: qty 1

## 2012-12-17 MED ORDER — SIMVASTATIN 20 MG PO TABS
20.0000 mg | ORAL_TABLET | Freq: Every day | ORAL | Status: DC
  Administered 2012-12-17 – 2012-12-18 (×2): 20 mg via ORAL
  Filled 2012-12-17 (×2): qty 1

## 2012-12-17 MED ORDER — FUROSEMIDE 20 MG PO TABS
20.0000 mg | ORAL_TABLET | Freq: Every day | ORAL | Status: DC
  Administered 2012-12-17 – 2012-12-19 (×3): 20 mg via ORAL
  Filled 2012-12-17 (×3): qty 1

## 2012-12-17 MED ORDER — LORAZEPAM 0.5 MG PO TABS: 0.2500 mg | ORAL_TABLET | Freq: Four times a day (QID) | ORAL | Status: DC | PRN

## 2012-12-17 NOTE — Clinical Social Work Note (Signed)
Clinical Social Work Department CLINICAL SOCIAL WORK PLACEMENT NOTE 12/17/2012  Patient:  LYNNE, RIGHI  Account Number:  [id number] Admit date:  12/13/2012  Clinical Social Worker:  Santa Genera, CLINICAL SOCIAL WORKER  Date/time:  12/17/2012 10:30 AM  Clinical Social Work is seeking post-discharge placement for this patient at the following level of care:   SKILLED NURSING   (*CSW will update this form in Epic as items are completed)   12/17/2012  Patient/family provided with [facility] Department of Clinical Social Work's list of facilities offering this level of care within the geographic area requested by the patient (or if unable, by the patient's family).  12/17/2012  Patient/family informed of their freedom to choose among providers that offer the needed level of care, that participate in Medicare, Medicaid or managed care program needed by the patient, have an available bed and are willing to accept the patient.  12/17/2012  Patient/family informed of [facility]' ownership interest in [facility], as well as of the fact that they are under no obligation to receive care at this facility.  PASARR submitted to EDS on 12/16/2012 PASARR number received from [employer] on 12/16/2012  FL2 transmitted to all facilities in geographic area requested by pt/family on  12/17/2012 FL2 transmitted to all facilities within larger geographic area on   Patient informed that his/her managed care company has contracts with or will negotiate with  certain facilities, including the following:     Patient/family informed of bed offers received:   Patient chooses bed at  Physician recommends and patient chooses bed at    Patient to be transferred to  on   Patient to be transferred to facility by   The following physician request were entered in Epic:   Additional Comments:  Santa Genera, LCSW Clinical Social Worker [phone])

## 2012-12-17 NOTE — Evaluation (Signed)
Occupational Therapy Evaluation Patient Details Name: Reginald Waller MRN: [id number] DOB: 1919/04/05 Today's Date: 12/17/2012 Time: 4098-1191 OT Time Calculation (min): 27 min  OT Assessment / Plan / Recommendation Clinical Impression  Patient is a 79 y/o male s/p Atelectasis presenting to acute OT with deficits below. Patient had recently experienced a R CVA and was re-admitted to [facility]. Patient will benefit from OT services to increase ADL performance, functional transfers and Bil strength and endurance. Recommend Snf at D/C.    OT Assessment  Patient needs continued OT Services    Follow Up Recommendations  SNF    Barriers to Discharge Decreased caregiver support    Equipment Recommendations  3 in 1 bedside comode;Tub/shower bench       Frequency  Min 2X/week    Precautions / Restrictions Precautions Precautions: Fall   Pertinent Vitals/Pain No complaints.    ADL  Lower Body Dressing: Performed;+1 Total assistance Where Assessed - Lower Body Dressing: Supine, head of bed up    OT Diagnosis: Generalized weakness;Hemiplegia non-dominant side  OT Problem List: Decreased strength;Decreased range of motion;Decreased activity tolerance;Impaired balance (sitting and/or standing);Decreased coordination;Impaired UE functional use;Increased edema OT Treatment Interventions: Self-care/ADL training;Therapeutic exercise;Energy conservation;Neuromuscular education;Therapeutic activities;Manual therapy;Balance training;Patient/family education   OT Goals Acute Rehab OT Goals OT Goal Formulation: With patient Time For Goal Achievement: 12/31/12 Potential to Achieve Goals: Good ADL Goals Pt Will Perform Grooming: with min assist;Sitting, edge of bed ADL Goal: Grooming - Progress: Goal set today Pt Will Perform Upper Body Bathing: with mod assist;Sitting, edge of bed ADL Goal: Upper Body Bathing - Progress: Goal set today Pt Will Perform Lower Body Bathing: with max assist;Sit to stand  from bed ADL Goal: Lower Body Bathing - Progress: Goal set today Pt Will Perform Upper Body Dressing: with min assist;Sitting, bed ADL Goal: Upper Body Dressing - Progress: Goal set today Pt Will Perform Lower Body Dressing: with mod assist;Sit to stand from bed ADL Goal: Lower Body Dressing - Progress: Goal set today Pt Will Transfer to Toilet: with mod assist;3-in-1;Stand pivot transfer ADL Goal: Toilet Transfer - Progress: Goal set today Pt Will Perform Toileting - Clothing Manipulation: with mod assist;Sitting on 3-in-1 or toilet ADL Goal: Toileting - Clothing Manipulation - Progress: Goal set today Pt Will Perform Toileting - Hygiene: with mod assist;Leaning right and/or left on 3-in-1/toilet;Sit to stand from 3-in-1/toilet ADL Goal: Toileting - Hygiene - Progress: Goal set today Arm Goals Pt Will Perform AROM: with minimal assist;Left upper extremity;1 set;10 reps (to increase strength and ROM) Arm Goal: AROM - Progress: Goal set today Miscellaneous OT Goals Miscellaneous OT Goal #1: Patient will increase static sitting balance while performing a daily task with Supervision. OT Goal: Miscellaneous Goal #1 - Progress: Goal set today  Visit Information  Last OT Received On: 12/17/12 Assistance Needed: +1    Subjective Data  Subjective: "My left arm is real heavy and numb." Patient Stated Goal: To get stronger.   Prior Functioning     Home Living Lives With: Spouse Available Help at Discharge: Family Type of Home: House Home Access: Level entry Home Layout: One level Firefighter: Standard Home Adaptive Equipment: Straight cane Prior Function Level of Independence: Independent with assistive device(s) Able to Take Stairs?: No Driving: No Vocation: Retired Comments: WW[employer] Musician: No difficulties Dominant Hand: Right         Vision/Perception  No report of visual impairment   Cognition  Overall Cognitive Status: Appears within  functional limits for tasks assessed/performed Arousal/Alertness: Awake/alert  Orientation Level: Appears intact for tasks assessed Behavior During Session: [facility] for tasks performed    Extremity/Trunk Assessment Right Upper Extremity Assessment RUE ROM/Strength/Tone: Deficits RUE ROM/Strength/Tone Deficits: A/ROM WFL in all ranges. MMT: 4-/5 RUE Coordination: WFL - gross/fine motor Left Upper Extremity Assessment LUE ROM/Strength/Tone: Deficits LUE ROM/Strength/Tone Deficits: shoulder elevation/retraction: 3-/5; elbow flexion/extention: 3/5; shoulder flexion/extension: 1/5; wrist flexion/extension: 3-/5; weak gross grasp; edema fingers to elbow. LUE Sensation: WFL - Light Touch;WFL - Proprioception LUE Coordination: Deficits LUE Coordination Deficits: Impaired fine and gross motor coordination     Mobility Transfers Transfers: Sit to Stand;Stand to Sit Sit to Stand: 2: Max assist Stand to Sit: 2: Max assist     Shoulder Instructions Positioning of UE while sleeping: Caregiver independent with task   Exercise General Exercises - Upper Extremity Shoulder Flexion:  (isometric 5x5) Shoulder Extension: Other (comment) (isometric; 5x5) Other Exercises Other Exercises: Weightbearing; Left forearm 5x5; extended arm; 5x5; mod assist Other Exercises: Educated family on edema management: edema massage and UE positioning in bed.      End of Session OT - End of Session Activity Tolerance: Patient tolerated treatment well Patient left: in bed;with call bell/phone within reach;with family/visitor present;with bed alarm set Nurse Communication: Other (comment) (oxygen stats and need for bandage replacement)    Limmie Patricia, OTR/L 12/17/2012, 3:29 PM

## 2012-12-17 NOTE — Progress Notes (Signed)
Physical Therapy Treatment Patient Details Name: Reginald Waller MRN: [id number] DOB: 1919-11-26 Today's Date: 12/17/2012 Time: 6578-4696 PT Time Calculation (min): 27 min  PT Assessment / Plan / Recommendation Comments on Treatment Session  Pt required max A for STS activity today and max cueing for proper posture.  Has significant lean to L and posterior which is corrected with verbal and TC to top of head.  Required Max A to sit EOB today due to leaning preferrence.  Required active assitance for L LE strength.     Follow Up Recommendations        Does the patient have the potential to tolerate intense rehabilitation     Barriers to Discharge        Equipment Recommendations       Recommendations for Other Services    Frequency     Plan Discharge plan remains appropriate    Precautions / Restrictions Restrictions Weight Bearing Restrictions: No   Pertinent Vitals/Pain No Pain/No Dyspnea    Mobility  Bed Mobility Bed Mobility: Sit to Supine;Scooting to [facility];Supine to Sit Supine to Sit: 3: Mod assist Sitting - Scoot to Edge of Bed: 4: Min assist Sit to Supine: 2: Max assist Scooting to HOB: 1: +2 Total assist Scooting to [facility]: Patient Percentage: 30% Details for Bed Mobility Assistance: cueing for UE and LE placement for improved bed mobility Transfers Transfers: Sit to Stand;Stand to Sit Sit to Stand: 2: Max assist Stand to Sit: 3: Mod assist Details for Transfer Assistance: cueing for R UE placement, encouraged to use L UE for WB purposes. x5 for activity tolerance and demonstration.     Exercises General Exercises - Lower Extremity Long Arc Quad: AROM;AAROM;5 reps;10 reps;Both Heel Slides: AROM;AAROM;Both;5 reps;10 reps Hip ABduction/ADduction: AROM;Both;10 reps;AAROM;5 reps Straight Leg Raises: AROM;AAROM;Both;5 reps;10 reps   PT Diagnosis:    PT Problem List:   PT Treatment Interventions:     PT Goals    Visit Information  Last PT Received On: 12/17/12    Subjective Data  Subjective: Pt reports that he thinks he can stand better today.    Cognition       Balance  Balance Balance Assessed: Yes Static Sitting Balance Static Sitting - Balance Support: Right upper extremity supported;Left upper extremity supported;Feet supported Static Sitting - Level of Assistance: 2: Max assist;Patient percentage (comment) (30%, posterior and L lean) Static Sitting - Comment/# of Minutes: sitting on EOB x8 minutes total Static Standing Balance Static Standing - Balance Support: Bilateral upper extremity supported Static Standing - Level of Assistance: 2: Max assist (x2 on RW) Static Standing - Comment/# of Minutes: 5x10-15 sec holds w/cueing for posture  End of Session PT - End of Session Equipment Utilized During Treatment: Gait belt Activity Tolerance: Patient limited by fatigue Patient left: in bed;with call bell/phone within reach;with nursing in room;with family/visitor present Nurse Communication: Mobility status   GP     Reginald Waller 12/17/2012, 11:32 AM

## 2012-12-17 NOTE — Clinical Social Work Psychosocial (Deleted)
     Clinical Social Work Department BRIEF PSYCHOSOCIAL ASSESSMENT 12/17/2012  Patient:  Reginald Waller, Reginald Waller     Account Number:  [id number]     Admit date:  12/13/2012  Clinical Social Worker:  Santa Genera, CLINICAL SOCIAL WORKER  Date/Time:  12/17/2012 10:30 AM  Referred by:  Physician  Date Referred:  12/17/2012 Referred for  SNF Placement   Other Referral:   Interview type:  Patient Other interview type:   Also spoke w wife at bedside    PSYCHOSOCIAL DATA Living Status:  FAMILY Admitted from facility:   Level of care:   Primary support name:  Mamie Nick Primary support relationship to patient:  SPOUSE Degree of support available:   Significant    CURRENT CONCERNS Current Concerns  Post-Acute Placement   Other Concerns:    SOCIAL WORK ASSESSMENT / PLAN CSW met w patient at bedside, patient somewhat alert but w slurred speech.  Discussed PT recommendation for SNF placement at discharge and process of placement, patient indicated that he needed help w his legs.  Wife in room, patient agreeable to CSW talking w wife.    CSW explained SNF placement process and gave SNF list. Wife says she cannot take care of patient's needs at home and hopes that rehab bed will be available in [place] area.  Wife requested that CSW talk w her neice, Laverle Hobby, and explain process.  Wife lives indepedently and drives.  Husband and wife have been married over 65 years.   Assessment/plan status:  Psychosocial Support/Ongoing Assessment of Needs Other assessment/ plan:   Information/referral to community resources:   SNF list    PATIENTS/FAMILYS RESPONSE TO PLAN OF CARE: Patient and wife willing to work w CSW on placement process.  Want neices involved in decision, CSW will contact neices.

## 2012-12-17 NOTE — Clinical Social Work Note (Signed)
Clinical Social Work Department BRIEF PSYCHOSOCIAL ASSESSMENT 12/17/2012  Patient:  Reginald Waller, Reginald Waller     Account Number:  [id number]     Admit date:  12/13/2012  Clinical Social Worker:  Santa Genera, CLINICAL SOCIAL WORKER  Date/Time:  12/17/2012 10:30 AM  Referred by:  Physician  Date Referred:  12/17/2012 Referred for  SNF Placement   Other Referral:   Interview type:  Patient Other interview type:   Also spoke w wife at bedside    PSYCHOSOCIAL DATA Living Status:  FAMILY Admitted from facility:   Level of care:   Primary support name:  Reginald Waller Primary support relationship to patient:  SPOUSE Degree of support available:   Significant    CURRENT CONCERNS Current Concerns  Post-Acute Placement   Other Concerns:    SOCIAL WORK ASSESSMENT / PLAN CSW met w patient at bedside, patient somewhat alert but w slurred speech.  Discussed PT recommendation for SNF placement at discharge and process of placement, patient indicated that he needed help w his legs.  Wife in room, patient agreeable to CSW talking w wife.    CSW explained SNF placement process and gave SNF list. Wife says she cannot take care of patient's needs at home and hopes that rehab bed will be available in [place] area.  Wife requested that CSW talk w her neice, Reginald Waller, and explain process.  Wife lives indepedently and drives.  Husband and wife have been married over 65 years.   Assessment/plan status:  Psychosocial Support/Ongoing Assessment of Needs Other assessment/ plan:   Information/referral to community resources:   SNF list    PATIENT'S/FAMILY'S RESPONSE TO PLAN OF CARE: Patient and wife willing to work w CSW on placement process.  Want neices involved in decision, CSW will contact neices.

## 2012-12-17 NOTE — Progress Notes (Signed)
Subjective: Patient seen in bed.  No family at the bedside.   Patient reports that he quit smoking 60 years ago.  He feels better today.  In my opinion, I think his speech is better today than yesterday.  Again, we spent some time discussing his [employer] career which he is certainly proud of.  He received a few honors and medals while serving in the service.   Objective: Vital signs in last 24 hours: Temp:  [97.4 F (36.3 C)-97.9 F (36.6 C)] 97.8 F (36.6 C) (01/22 1348) Pulse Rate:  [88] 88  (01/22 1348) Resp:  [12-24] 16  (01/22 1348) BP: (76-144)/(47-69) 141/54 mmHg (01/22 1348) SpO2:  [91 %-95 %] 91 % (01/22 1453) Weight:  [166 lb 7.2 oz (75.5 kg)] 166 lb 7.2 oz (75.5 kg) (01/22 0500)  Intake/Output from previous day: 01/21 0800 - 01/22 0759 In: 1238 [P.O.:50; I.V.:980; IV Piggyback:208] Out: 3300 [Urine:3300] Intake/Output this shift: Total I/O In: 280 [P.O.:240; I.V.:40] Out: -   General appearance: alert, cooperative, appears stated age and dysarthria Extremities: unable to raise left arm, decreased dorsiflexion and extension of foot, decreased strength in left LE.  Lab Results:   Basename 12/17/12 0500 12/16/12 0416  WBC 14.3* 7.5  HGB 9.3* 8.9*  HCT 28.3* 26.8*  PLT 462* 412*   BMET  Basename 12/17/12 0500 12/16/12 0416  NA 139 138  K 4.1 4.8  CL 104 106  CO2 25 22  GLUCOSE 132* 177*  BUN 53* 49*  CREATININE 1.77* 1.83*  CALCIUM 8.4 8.4    Studies/Results: US Carotid Duplex Bilateral  12/17/2012  *RADIOLOGY REPORT*  Clinical Data: Stroke., hypertension, previous tobacco use.  BILATERAL CAROTID DUPLEX ULTRASOUND  Technique: Wallace Cullens scale imaging, color Doppler and duplex ultrasound was performed of bilateral carotid and vertebral arteries in the neck.  Comparison: None available  Criteria:  Quantification of carotid stenosis is based on velocity parameters that correlate the residual internal carotid diameter with NASCET-based stenosis levels, using the  diameter of the distal internal carotid lumen as the denominator for stenosis measurement.  The following velocity measurements were obtained:                   PEAK SYSTOLIC/END DIASTOLIC RIGHT ICA:                        184/39cm/sec CCA:                        71/17cm/sec SYSTOLIC ICA/CCA RATIO:     2.57 DIASTOLIC ICA/CCA RATIO:    2.27 ECA:                        350cm/sec  LEFT ICA:                        130/21cm/sec CCA:                        100/10cm/sec SYSTOLIC ICA/CCA RATIO:     1.31 DIASTOLIC ICA/CCA RATIO:    2.13 ECA:                        216cm/sec  Findings:  RIGHT CAROTID ARTERY: Patchy nonocclusive plaque through the common carotid artery.  There is eccentric calcified plaque in the carotid bulb extending to the proximal ICA.  Markedly elevated peak systolic velocities  in the proximal external carotid artery with focal aliasing on color Doppler interrogation.  There are moderately increased peak systolic velocities in the ICA just distal to the plaque.  Segments of the proximal ICA are obscured by distal acoustic shadowing from plaque calcification.  RIGHT VERTEBRAL ARTERY:  Normal flow direction and waveform.  LEFT CAROTID ARTERY: Eccentric partially calcified plaque in the mid and distal common carotid artery.  Circumferential irregular plaque in the carotid bulb and proximal ICA resulting at least mild stenosis.  There is moderate stenosis at the origin of the external carotid artery with elevated peak systolic velocities.  LEFT VERTEBRAL ARTERY:  Normal flow direction and waveform.  IMPRESSION:  1.  Bilateral carotid bifurcation and proximal ICA plaque, resulting in at least 50-69% diameter stenosis on the right, less than 50% diameter stenosis on the left. The exam does not exclude plaque ulceration or embolization.  Continued surveillance recommended. 2.  Bilateral external carotid artery origin stenoses, right worse than left.   Original Report Authenticated By: D. Andria Rhein, MD    Dg  Chest Port 1 View  12/17/2012  *RADIOLOGY REPORT*  Clinical Data: Wheezing, elevated white blood cell count  PORTABLE CHEST - 1 VIEW  Comparison: Portable chest x-ray of 12/15/2012  Findings: The lungs are not as well aerated and there is little change to slight worsening of lower lobe opacities most consistent with pneumonia and effusions.  Mild cardiomegaly is stable.  No acute skeletal abnormality is seen.  There are degenerative changes noted in the shoulders.  IMPRESSION: Slight decrease in aeration with some worsening of bibasilar opacities most consistent with pneumonia and effusions.  An element of CHF cannot be excluded.   Original Report Authenticated By: Dwyane Dee, M.D.     Medications: I have reviewed the patient's current medications.  Assessment/Plan: 1. High grade, urothelial cancer invading the muscular propria, S/P TURBT on 12/09/2012 by Dr. Jerre Simon.  2. Acute non-hemorrhagic posterior right frontal lobe small infarct causing left sided weakness, dysarthria, and left facial droop.  3. B/L carotid plaque causing 50-69% stenosis. 4. Severe mitral annular calcification, per CT  5. Dehydration, BUN: Creatinine ration is > 20 at 26.7  6. Anemia, likely secondary to acute blood loss +/- heart failure +/- comorbidities.  Anemia panel reveals an anemia of chronic disease picture.   7. Minimal thrombocytosis at 412,000. No intervention required.  8. Acute diastolic heart failure, BNP > 19,147  9. Elevated troponin  10. Left arm cellulitis  11. Left sided weakeness  Patient and plan discussed with Dr. Glenford Peers and he is in agreement with the aforementioned.  Patient seen and examined by Dr. Mariel Sleet as well.      LOS: 4 days    KEFALAS,THOMAS 12/17/2012

## 2012-12-17 NOTE — Progress Notes (Signed)
[facility] PROGRESS NOTE  Reginald Waller ZOX:[id number] DOB: 23-Oct-1919 DOA: 12/13/2012 PCP: Kirk Ruths, MD  Patient complains of left thigh pain that radiates up to his hip.  Assessment/Plan:  Acute non hemorrhagic posterior right frontal lobe small infarct TPA not ordered do to recent surgical procedure and hematuria Residual dysarthria (some improvement noted in speech today), and left-sided weakness Lipid panel essentially normal, 2-D echo previously done (1/14) Started on daily full strength aspirin Anticipate he will need skilled nursing rehabilitation.  Left thigh pain radiating to hip Uncertain etiology. No lesions or erythema found on exam Check Doppler ultrasound to rule out DVT Mobilize as much as possible Utilize Tylenol for pain  New leukocytosis White count changed from 7.5, on January 21, to 14.3 on January 22 No fever On vancomycin Will monitor.  High-grade bladder cancer with muscle involvement Recent TURBT by Dr. Jerre Simon Residual urinary retention, Foley catheter in place. Mgmt per Dr. Jerre Simon Hematuria appears resolved.  Urine in Foley bag normal color Oncology recommends radiology oncology consultation to be scheduled at the time of discharge (likely with Dr. Thersa Salt)  Acute diastolic heart failure On January 20 patient was observed to have fluid overload evidenced by pitting edema and shortness of breath/respiratory distress. Significant improvement on January 21. Respiratory distress resolved. IV steroids discontinued. Recent echo 1/13/ 2014 showed LVEF of 60-65% with mild concentric hypertrophy. BNP 17,611 on January 21 IV Lasix 40 mg twice a day was discontinued.  Will start low dose oral Lasix as he seems to reaccumulate fluid quickly. monitor blood pressure  Elevated troponin Troponins were cycled during the patient's CVA event Peak was 0.55 EKG unchanged from previous. No evidence of acute coronary syndrome. Likely related to renal  insufficiency and acute on chronic diastolic heart failure.  Elevated creatinine Recent baseline creatinine 1.62 Nephrotoxic medications have been discontinued, will monitor vancomycin closely  Left arm cellulitis Originally caused by IV infiltration Doppler ultrasound negative for DVT Appears to be improving since vancomycin started. Will discontinue levofloxacin. On IV vancomycin (started January 20?)  History of Iron deficiency Anemia Related to recent hematuria as well as chronic kidney disease Hemoglobin remained stable at approximately 9.  Hypertension BP currently soft BP meds currently being held, other than Lasix   Code Status: DNR [name] Family Communication:   Wife Mary at bedside Disposition Plan:  Transfer to telemetry bed. SNF at discharge.   Possibly Thursday or Friday   Consultants:  Urology  Oncology  Procedures:    Antibiotics:  Levaquin previous admission -> 12/16/12  Vancomycin 12/15/2012  HPI/Subjective: Complaining of left leg pain.    Objective: Filed Vitals:   12/17/12 0500 12/17/12 0600 12/17/12 0615 12/17/12 0650  BP: 124/58 139/64 128/60   Pulse:      Temp:      TempSrc:      Resp: 13 19 12    Height:      Weight: 75.5 kg (166 lb 7.2 oz)     SpO2:    93%    Intake/Output Summary (Last 24 hours) at 12/17/12 1258 Last data filed at 12/17/12 0900  Gross per 24 hour  Intake   1274 ml  Output   2300 ml  Net  -1026 ml   Filed Weights   12/15/12 1600 12/16/12 0500 12/17/12 0500  Weight: 79 kg (174 lb 2.6 oz) 77.3 kg (170 lb 6.7 oz) 75.5 kg (166 lb 7.2 oz)    Exam:   General:  Alert, awake, appears calm  Cardiovascular: Regular rate  and rhythm, no murmurs rubs or gallops, 1+ edema on left lower extremity  Respiratory: Slight anterior wheeze, no accessory muscle movement  Abdomen: Soft, slight distention, nontender, no masses, positive bowel sounds, Foley catheter still in place  Neuro:  Very hard of hearing, dysarthria  slightly improved today-speech is more clear. Still with significant left sided extremity weakness.  Skin: Swelling and erythema in left upper extremity remains. erythema on the medial side of the biceps area in right upper extremity  Left lower extremity: no signs of erythema, or skin lesion  Data Reviewed: Basic Metabolic Panel:  Lab 12/17/12 1610 12/16/12 0416 12/15/12 0343 12/14/12 0650 12/13/12 0541  NA 139 138 140 136 138  K 4.1 4.8 4.5 4.8 4.9  CL 104 106 108 105 107  CO2 25 22 21 19 20   GLUCOSE 132* 177* 112* 137* 114*  BUN 53* 49* 49* 36* 33*  CREATININE 1.77* 1.83* 1.90* 1.72* 1.92*  CALCIUM 8.4 8.4 8.6 8.6 8.5  MG -- -- -- -- --  PHOS -- -- -- -- --   Liver Function Tests:  Lab 12/16/12 0416 12/14/12 0650  AST 30 26  ALT 21 16  ALKPHOS 72 78  BILITOT 0.4 0.3  PROT 5.5* 5.7*  ALBUMIN 2.5* 2.4*   CBC:  Lab 12/17/12 0500 12/16/12 0416 12/15/12 0343 12/14/12 0650 12/13/12 0541  WBC 14.3* 7.5 12.9* 5.9 11.1*  NEUTROABS -- -- -- -- 8.0*  HGB 9.3* 8.9* 9.2* 9.0* 8.9*  HCT 28.3* 26.8* 28.6* 27.8* 27.2*  MCV 92.2 92.4 92.9 93.0 93.5  PLT 462* 412* 502* 419* 397   Cardiac Enzymes:  Lab 12/16/12 0415 12/15/12 2212 12/15/12 1613  CKTOTAL -- -- --  CKMB -- -- --  CKMBINDEX -- -- --  TROPONINI 0.41* 0.51* 0.55*   BNP (last 3 results)  Basename 12/16/12 0416 12/11/12 0404 12/08/12 0534  PROBNP 17611.0* 6791.0* 16208.0*   CBG: No results found for this basename: GLUCAP:5 in the last 168 hours  Recent Results (from the past 240 hour(s))  MRSA PCR SCREENING     Status: Normal   Collection Time   12/08/12 11:07 AM      Component Value Range Status Comment   MRSA by PCR NEGATIVE  NEGATIVE Final   MRSA PCR SCREENING     Status: Normal   Collection Time   12/15/12  4:10 PM      Component Value Range Status Comment   MRSA by PCR NEGATIVE  NEGATIVE Final   URINE CULTURE     Status: Normal   Collection Time   12/15/12  4:10 PM      Component Value Range Status  Comment   Specimen Description URINE, CLEAN CATCH   Final    Special Requests NONE   Final    Culture  Setup Time 12/16/2012 03:24   Final    Colony Count NO GROWTH   Final    Culture NO GROWTH   Final    Report Status 12/17/2012 FINAL   Final      Studies: Mr Brain Wo Contrast  12/15/2012  *RADIOLOGY REPORT*  Clinical Data: Altered mental status.  History of prior stroke.  MRI HEAD WITHOUT CONTRAST  Technique:  Multiplanar, multiecho pulse sequences of the brain and surrounding structures were obtained according to standard protocol without intravenous contrast.  Comparison: None.  Findings: Motion degraded exam.  Acute non hemorrhagic posterior right frontal lobe small infarct. This appears slightly rounded rather than wedge-shaped and if the patient had  any progressive symptoms, follow-up imaging to confirm that this represents an infarct may be considered.  Prominent small vessel disease type changes.  Global atrophy.  Ventricular prominence felt to be related atrophy rather than hydrocephalus.  No intracranial hemorrhage.  No intracranial mass lesion detected on this unenhanced exam.  Major intracranial vascular structures are patent.  Prominent panus C1-2 level.  IMPRESSION:  Motion degraded exam.  Acute non hemorrhagic posterior right frontal lobe small infarct.  Prominent small vessel disease type changes.  This has been made a PRA call report utilizing dashboard call feature.   Original Report Authenticated By: Lacy Duverney, M.D.    Dg Chest Port 1 View  12/17/2012  *RADIOLOGY REPORT*  Clinical Data: Wheezing, elevated white blood cell count  PORTABLE CHEST - 1 VIEW  Comparison: Portable chest x-ray of 12/15/2012  Findings: The lungs are not as well aerated and there is little change to slight worsening of lower lobe opacities most consistent with pneumonia and effusions.  Mild cardiomegaly is stable.  No acute skeletal abnormality is seen.  There are degenerative changes noted in the  shoulders.  IMPRESSION: Slight decrease in aeration with some worsening of bibasilar opacities most consistent with pneumonia and effusions.  An element of CHF cannot be excluded.   Original Report Authenticated By: Dwyane Dee, M.D.    Dg Chest Port 1 View  12/15/2012  *RADIOLOGY REPORT*  Clinical Data: Shortness of breath.  PORTABLE CHEST - 1 VIEW  Comparison: 12/13/2012.  Findings: Patient is rotated.  Heart size is grossly stable.  There is bibasilar dependent air space disease and bilateral pleural effusions.  Findings may be superimposed on chronic lung disease. Probable slight overall worsening from 12/14/2011.  IMPRESSION: Bibasilar predominant air space disease and bilateral pleural effusions, slightly worsened from 12/13/2012.   Original Report Authenticated By: Leanna Battles, M.D.     Scheduled Meds:    . albuterol  2.5 mg Nebulization Q4H  . antiseptic oral rinse  15 mL Mouth Rinse BID  . aspirin EC  325 mg Oral Daily  . heparin  5,000 Units Subcutaneous Q8H  . latanoprost  1 drop Both Eyes QHS  . levothyroxine  75 mcg Oral QAC breakfast  . simvastatin  20 mg Oral q1800  . vancomycin  1,000 mg Intravenous Q24H   Continuous Infusions:    . sodium chloride 20 mL/hr at 12/17/12 0900    Principal Problem:  *Atelectasis Active Problems:  Bladder cancer  Acute renal failure  Hypertension  CVA (cerebral vascular accident)  Acute diastolic heart failure    Time spent: 40 min.    Conley Canal  [facility] Pager [phone]. If 8PM-8AM, please contact night-coverage at [email or web address], password [facility] 12/17/2012, 12:58 PM  LOS: 4 days    Attending note Patient interviewed and examined.  Agree with above.  Monitor for temps and worsening leukocytosis. Resume iron.  Likely SNF tomorrow. Doppler leg to r/o DVT.  Crista Curb, M.D.

## 2012-12-18 ENCOUNTER — Inpatient Hospital Stay (HOSPITAL_COMMUNITY): Payer: Medicare Other

## 2012-12-18 DIAGNOSIS — E860 Dehydration: Secondary | ICD-10-CM

## 2012-12-18 DIAGNOSIS — D473 Essential (hemorrhagic) thrombocythemia: Secondary | ICD-10-CM

## 2012-12-18 LAB — CBC
Hemoglobin: 10.2 g/dL — ABNORMAL LOW (ref 13.0–17.0)
MCH: 30.2 pg (ref 26.0–34.0)
MCHC: 32.4 g/dL (ref 30.0–36.0)
MCV: 92.9 fL (ref 78.0–100.0)
Platelets: 511 10*3/uL — ABNORMAL HIGH (ref 150–400)
Platelets: 517 10*3/uL — ABNORMAL HIGH (ref 150–400)
RBC: 3.38 MIL/uL — ABNORMAL LOW (ref 4.22–5.81)
RBC: 3.64 MIL/uL — ABNORMAL LOW (ref 4.22–5.81)
RDW: 15 % (ref 11.5–15.5)
WBC: 19.7 10*3/uL — ABNORMAL HIGH (ref 4.0–10.5)

## 2012-12-18 LAB — BASIC METABOLIC PANEL
BUN: 51 mg/dL — ABNORMAL HIGH (ref 6–23)
Calcium: 8.3 mg/dL — ABNORMAL LOW (ref 8.4–10.5)
GFR calc Af Amer: 45 mL/min — ABNORMAL LOW (ref >90)
GFR calc non Af Amer: 39 mL/min — ABNORMAL LOW (ref >90)
Potassium: 4.1 mEq/L (ref 3.5–5.1)
Sodium: 140 mEq/L (ref 135–145)

## 2012-12-18 MED ORDER — AMOXICILLIN-POT CLAVULANATE 400-57 MG/5ML PO SUSR
875.0000 mg | Freq: Two times a day (BID) | ORAL | Status: DC
  Administered 2012-12-18 – 2012-12-19 (×3): 875 mg via ORAL
  Filled 2012-12-18 (×9): qty 10.9

## 2012-12-18 MED ORDER — ASPIRIN 325 MG PO TABS
325.0000 mg | ORAL_TABLET | Freq: Every day | ORAL | Status: DC
  Administered 2012-12-18: 325 mg via ORAL
  Filled 2012-12-18: qty 1

## 2012-12-18 MED ORDER — MUSCLE RUB 10-15 % EX CREA: 1.0000 "application " | TOPICAL_CREAM | CUTANEOUS | Status: DC | PRN

## 2012-12-18 MED ORDER — AMOXICILLIN-POT CLAVULANATE 875-125 MG PO TABS
1.0000 | ORAL_TABLET | Freq: Two times a day (BID) | ORAL | Status: DC
Start: 2012-12-18 — End: 2012-12-18
  Filled 2012-12-18: qty 1

## 2012-12-18 MED ORDER — AMLODIPINE BESYLATE 5 MG PO TABS
5.0000 mg | ORAL_TABLET | Freq: Every day | ORAL | Status: DC
  Administered 2012-12-18 – 2012-12-19 (×2): 5 mg via ORAL
  Filled 2012-12-18 (×2): qty 1

## 2012-12-18 MED ORDER — DOCUSATE SODIUM 100 MG PO CAPS
200.0000 mg | ORAL_CAPSULE | Freq: Every day | ORAL | Status: DC
  Administered 2012-12-18 – 2012-12-19 (×2): 200 mg via ORAL
  Filled 2012-12-18 (×2): qty 2

## 2012-12-18 MED ORDER — POLYSACCHARIDE IRON COMPLEX 150 MG PO CAPS
150.0000 mg | ORAL_CAPSULE | Freq: Every day | ORAL | Status: DC
  Administered 2012-12-18 – 2012-12-19 (×2): 150 mg via ORAL
  Filled 2012-12-18 (×2): qty 1

## 2012-12-18 NOTE — Procedures (Signed)
Objective Swallowing Evaluation: Modified Barium Swallowing Study  Patient Details  Name: Reginald Waller MRN: [id number] Date of Birth: May 21, 1919  Today's Date: 12/18/2012 Time: 4098-1191 SLP Time Calculation (min): 25 min  Past Medical History:  Past Medical History  Diagnosis Date  . Hypertension   . Arthritis   . Stroke   . Malaria   . Urothelial cancer 12/16/2012   Past Surgical History:  Past Surgical History  Procedure Date  . Hernia repair   . Cataract extraction   . Appendectomy   . Eye surgery   . Cystoscopy 12/08/2012    Procedure: CYSTOSCOPY FLEXIBLE;  Surgeon: Ky Barban, MD;  Location: [facility];  Service: Urology;  Laterality: N/A;  . Transurethral resection of bladder tumor 12/09/2012    Procedure: TRANSURETHRAL RESECTION OF BLADDER TUMOR (TURBT);  Surgeon: Ky Barban, MD;  Location: [facility];  Service: Urology;  Laterality: N/A;   HPI:  Reginald Waller is a 79 y.o. male who was discharged Sunday (TURPT by Dr. Jerre Simon). He went home, he was supposed to have a home oxygen delivered but did not and just after midnight last night, he became short of breath. He presented to the emergency room with dyspnea. He has had no fever. Shortly after admission he had a change in status and MRI showed acute non-hemorrhagic posterior right frontal lobe small infarct. BSE completed  Tuesday with recommendation for D2/chopped with nectar-thick liquids. RN reports that he is tolerating well but needs cues to slow down when drinking.     Assessment / Plan / Recommendation Clinical Impression  Dysphagia Diagnosis: Mild oral phase dysphagia;Mild pharyngeal phase dysphagia Clinical impression: Mild/mod oropharyngeal phase dysphagia in setting of acute CVA characterized by decreased oral control of liquid bolus, weak lingual manipulation and propulsion, premature spillage over base of tongue into pharynx, with slight delay in swallow initiation and decreased laryngeal closure  resulting in aspiration of thins before the swallow elicited with strong cough (but couldn't clear entirely) and penetration of thins. Chin tuck posture attempted, however it was difficult for pt to implement and not consistently effective. Recommend: D3/mech soft with nectar-thick liquids with aspiration precautions (alert and upright for all po, take small sips/ok for straw with single sips, alternate solids and liquids as needed,  watch for impulsivity/rate).     Treatment Recommendation  Therapy as outlined in treatment plan below    Diet Recommendation Dysphagia 3 (Mechanical Soft);Nectar-thick liquid   Liquid Administration via: Cup Medication Administration: Crushed with puree Supervision: Full supervision/cueing for compensatory strategies Compensations: Slow rate;Small sips/bites;Follow solids with liquid;Multiple dry swallows after each bite/sip Postural Changes and/or Swallow Maneuvers: Seated upright 90 degrees;Upright 30-60 min after meal;Out of bed for meals    Other  Recommendations Oral Care Recommendations: Oral care BID Other Recommendations: Clarify dietary restrictions;Order thickener from pharmacy   Follow Up Recommendations  Skilled Nursing facility;Inpatient Rehab    Frequency and Duration min 3x week  2 weeks      SLP Swallow Goals Patient will consume recommended diet without observed clinical signs of aspiration with: Minimal assistance Patient will utilize recommended strategies during swallow to increase swallowing safety with: Minimal assistance   General Date of Onset: 12/15/12 HPI: Reginald Waller is a 79 y.o. male who was discharged Sunday (TURPT by Dr. Jerre Simon). He went home, he was supposed to have a home oxygen delivered but did not and just after midnight last night, he became short of breath. He presented to the emergency room with dyspnea.  He has had no fever. Shortly after admission he had a change in status and MRI showed acute non-hemorrhagic  posterior right frontal lobe small infarct. BSE completed  Tuesday with recommendation for D2/chopped with nectar-thick liquids. RN reports that he is tolerating well but needs cues to slow down when drinking. Type of Study: Modified Barium Swallowing Study Reason for Referral: Objectively evaluate swallowing function Previous Swallow Assessment: BSE 12/16/12: D2/NTL Diet Prior to this Study: Dysphagia 2 (chopped);Nectar-thick liquids Temperature Spikes Noted: No Respiratory Status: Supplemental O2 delivered via (comment) (2 L) History of Recent Intubation: No Behavior/Cognition: Alert;Cooperative;Pleasant mood;Hard of hearing Oral Cavity - Dentition: Dentures, bottom (ill-fitting upper dentures removed) Oral Motor / Sensory Function: Impaired - see Bedside swallow eval Self-Feeding Abilities: Needs set up Patient Positioning: Upright in chair Baseline Vocal Quality: Clear Volitional Cough: Strong;Congested Volitional Swallow: Able to elicit Anatomy: Within functional limits Pharyngeal Secretions: Not observed secondary MBS    Reason for Referral Objectively evaluate swallowing function   Oral Phase Oral Preparation/Oral Phase Oral Phase: Impaired Oral - Nectar Oral - Nectar Cup: Within functional limits Oral - Nectar Straw: Within functional limits Oral - Thin Oral - Thin Cup:  (premature spillage) Oral - Solids Oral - Puree: Within functional limits Oral - Mechanical Soft: Weak lingual manipulation;Reduced posterior propulsion;Lingual/palatal residue;Piecemeal swallowing Oral - Pill: Reduced posterior propulsion (Pt unable to propel pill posteriorly with NTL, expectorated) Oral Phase - Comment Oral Phase - Comment: Decreased control with premature spillage over base of tongue especially with liquids   Pharyngeal Phase Pharyngeal Phase Pharyngeal Phase: Impaired Pharyngeal - Nectar Pharyngeal - Nectar Cup: Premature spillage to valleculae;Premature spillage to pyriform  sinuses;Pharyngeal residue - valleculae;Lateral channel residue;Delayed swallow initiation (trace residuals, benefits from repeat swallow) Pharyngeal - Nectar Straw: Delayed swallow initiation;Premature spillage to valleculae;Premature spillage to pyriform sinuses;Reduced epiglottic inversion;Reduced airway/laryngeal closure;Penetration/Aspiration during swallow;Lateral channel residue;Pharyngeal residue - valleculae Penetration/Aspiration details (nectar straw): Material does not enter airway;Material enters airway, remains ABOVE vocal cords then ejected out;Material enters airway, remains ABOVE vocal cords and not ejected out Pharyngeal - Thin Pharyngeal - Thin Cup: Delayed swallow initiation;Premature spillage to pyriform sinuses;Premature spillage to valleculae;Reduced epiglottic inversion;Reduced airway/laryngeal closure;Penetration/Aspiration before swallow;Penetration/Aspiration during swallow;Trace aspiration;Moderate aspiration;Pharyngeal residue - valleculae;Compensatory strategies attempted (Comment);Lateral channel residue (Chin tuck, very difficult for him to hold bolus orally befor) Penetration/Aspiration details (thin cup): Material enters airway, passes BELOW cords and not ejected out despite cough attempt by patient;Material enters airway, CONTACTS cords then ejected out;Material enters airway, remains ABOVE vocal cords then ejected out Pharyngeal - Solids Pharyngeal - Puree: Within functional limits Pharyngeal - Mechanical Soft: Within functional limits Pharyngeal Phase - Comment Pharyngeal Comment: Premature spillage with liquids with slight delay in swallow initiation  Cervical Esophageal Phase     Thank you,  Havery Moros, CCC-SLP [phone]   Cervical Esophageal Phase Cervical Esophageal Phase: [facility]         PORTER,DABNEY 12/18/2012, 2:28 PM

## 2012-12-18 NOTE — Progress Notes (Signed)
ANTIBIOTIC CONSULT NOTE   Pharmacy Consult for Vancomycin Indication: pneumonia  Allergies  Allergen Reactions  . Shellfish Allergy Other (See Comments)    Swelling of throat and tongue.    Patient Measurements: Height: 5\' 11"  (180.3 cm) Weight: 166 lb 7.2 oz (75.5 kg) IBW/kg (Calculated) : 75.3  Ad  Vital Signs: Temp: 97.4 F (36.3 C) (01/23 0530) Temp src: Oral (01/23 0530) BP: 162/63 mmHg (01/23 0530) Pulse Rate: 84  (01/23 0530) Intake/Output from previous day: 01/22 0701 - 01/23 0700 In: 940 [P.O.:480; I.V.:460] Out: 1400 [Urine:1400] Intake/Output from this shift:    Labs:  Basename 12/18/12 0355 12/17/12 0500 12/16/12 0416  WBC 17.2* 14.3* 7.5  HGB 10.2* 9.3* 8.9*  PLT 511* 462* 412*  LABCREA -- -- --  CREATININE 1.49* 1.77* 1.83*   Estimated Creatinine Clearance: 34.4 ml/min (by C-G formula based on Cr of 1.49). No results found for this basename: VANCOTROUGH:2,VANCOPEAK:2,VANCORANDOM:2,GENTTROUGH:2,GENTPEAK:2,GENTRANDOM:2,TOBRATROUGH:2,TOBRAPEAK:2,TOBRARND:2,AMIKACINPEAK:2,AMIKACINTROU:2,AMIKACIN:2, in the last 72 hours   Microbiology: Recent Results (from the past 720 hour(s))  URINE CULTURE     Status: Normal   Collection Time   12/04/12  7:59 PM      Component Value Range Status Comment   Specimen Description URINE, CLEAN CATCH   Final    Special Requests NONE   Final    Culture  Setup Time 12/05/2012 06:55   Final    Colony Count NO GROWTH   Final    Culture NO GROWTH   Final    Report Status 12/06/2012 FINAL   Final   MRSA PCR SCREENING     Status: Normal   Collection Time   12/08/12 11:07 AM      Component Value Range Status Comment   MRSA by PCR NEGATIVE  NEGATIVE Final   MRSA PCR SCREENING     Status: Normal   Collection Time   12/15/12  4:10 PM      Component Value Range Status Comment   MRSA by PCR NEGATIVE  NEGATIVE Final   URINE CULTURE     Status: Normal   Collection Time   12/15/12  4:10 PM      Component Value Range Status Comment     Specimen Description URINE, CLEAN CATCH   Final    Special Requests NONE   Final    Culture  Setup Time 12/16/2012 03:24   Final    Colony Count NO GROWTH   Final    Culture NO GROWTH   Final    Report Status 12/17/2012 FINAL   Final     Medical History: Past Medical History  Diagnosis Date  . Hypertension   . Arthritis   . Stroke   . Malaria   . Urothelial cancer 12/16/2012    Medications:  Scheduled:     . albuterol  2.5 mg Nebulization Q4H  . amLODipine  5 mg Oral Daily  . antiseptic oral rinse  15 mL Mouth Rinse BID  . aspirin  325 mg Oral Daily  . furosemide  20 mg Oral Daily  . [COMPLETED] gabapentin  300 mg Oral Once  . heparin  5,000 Units Subcutaneous Q8H  . iron polysaccharides  150 mg Oral Daily  . latanoprost  1 drop Both Eyes QHS  . levothyroxine  75 mcg Oral QAC breakfast  . simvastatin  20 mg Oral q1800  . vancomycin  1,000 mg Intravenous Q24H  . [DISCONTINUED] aspirin EC  325 mg Oral Daily   Assessment: Vancomycin added for suspected pneumonia. SCr is improving.  Estimated Creatinine Clearance: 34.4 ml/min (by C-G formula based on Cr of 1.49).  Goal of Therapy:  Vancomycin trough level 15-20 mcg/ml  Plan:  Vancomycin 1 GM IV every 24 hours Vancomycin trough before dose today. Monitor renal function Labs per protocol  Valrie Hart A 12/18/2012,10:56 AM

## 2012-12-18 NOTE — Progress Notes (Signed)
12/18/12 1244 Patient noted to have bloody urine this morning about 1130. Voided 100 ml bloody urine independently. No c/o pain or discomfort when voiding. No blood noted to urine while foley catheter in place yesterday per report from nursing staff. Notified Dr Lendell Caprice and Algis Downs, PA. Stated would d/c aspirin and heparin. Will order SCDs for VTE prophylaxis. Notified patient has already had morning dose of aspirin. Stated okay. Instructed nursing to notify Dr Jerre Simon of bloody urine output. Called Dr Jerre Simon and left message with OR nurse since he was in surgery. Stated would notify and he would call us back after surgery later this afternoon. Nursing to monitor. Earnstine Regal, RN

## 2012-12-18 NOTE — Therapy (Signed)
Speech Pathology  MBSS to be completed this AM. Results to follow.  Thank you,  Havery Moros, CCC-SLP [phone]

## 2012-12-18 NOTE — Progress Notes (Signed)
12/18/12 1733 Patient assisted up to chair this afternoon with two assist. Left sided weakness, requires assist for activity. Tolerated fairly well, unsteady gait. Chair alarm on for safety, call light placed within reach, pt demonstrates correct use. Instructed to call for assistance when needed and not attempt getting up on his own. Stated "i'll call, i can't get up". Nursing to monitor. Earnstine Regal, RN

## 2012-12-18 NOTE — Progress Notes (Signed)
[facility] PROGRESS NOTE  Reginald Waller JYN:[id number] DOB: 1918/12/07 DOA: 12/13/2012 PCP: Kirk Ruths, MD  Patient complains of left thigh pain that radiates up to his hip.  Assessment/Plan:  Acute hematuria (1/23) Mr. Jawad developed acute hematuria after the removal of his foley catheter.   Perhaps this was trauma induced. Dr. Jerre Simon has been made aware and will see the patient Heparin and full strength aspirin discontinued (despite recent CVA), SCDs placed Check CBC this evening and again in the morning  Acute non hemorrhagic posterior right frontal lobe small infarct TPA not ordered do to recent surgical procedure and hematuria Residual dysarthria (some improvement noted in speech again today), and left-sided weakness Lipid panel essentially normal, 2-D echo previously done (1/14) Carotid Dopplers demonstrate bilateral stenosis at the carotid artery origin right (50-69%) greater than left (less than 50%). Aspirin discontinued due to acute hematuria SNF, [facility] at discharge  Left thigh pain radiating to hip Uncertain etiology. No lesions or erythema found on exam Check Doppler ultrasound negative for DVT Mobilize as much as possible Utilize Tylenol and BenGay for pain  New leukocytosis White count changed from 7.5, on January 21, to 17.2 on January 22 Uncertain etiology No fever, chest x-ray is equivocal, urine culture shows no growth, clinical exam unrevealing Discontinued vancomycin today and started Augmentin orally. Will monitor.  High-grade bladder cancer with muscle involvement Recent TURBT by Dr. Jerre Simon Residual urinary retention, Foley catheter removed January 23. Mgmt per Dr. Jerre Simon. Hematuria had resolved with Foley in place but returns with removal of Foley catheter today Oncology recommends radiology oncology consultation to be scheduled at the time of discharge (likely with Dr. Thersa Salt).  Appreciate oncology's recommendations.  Acute  diastolic heart failure On January 20 patient was observed to have fluid overload evidenced by pitting edema and shortness of breath/respiratory distress. Significant improvement on January 21. Respiratory distress resolved. Recent echo 1/13/ 2014 showed LVEF of 60-65% with mild concentric hypertrophy. BNP 17,611 on January 21 On Lasix 20 mg by mouth daily  Elevated troponin Troponins were cycled during the patient's CVA event Peak was 0.55 EKG unchanged from previous. No evidence of acute coronary syndrome. Likely related to renal insufficiency and acute on chronic diastolic heart failure.  Elevated creatinine Recent baseline creatinine 1.62 Patient's creatinine now below baseline (1.49) Nephrotoxic medications have been discontinued  Left arm cellulitis Originally caused by IV infiltration Doppler ultrasound negative for DVT Appears to be improving. Vancomycin IV discontinued. Started Augmentin by mouth.  History of Iron deficiency Anemia Related to recent hematuria as well as chronic kidney disease Hemoglobin remained stable at approximately 9.  Hypertension BP beginning to rebound from soft to slightly elevated. Restarted amlodipine (1/23) at 5 mg daily   Code Status: DNR [name] Family Communication:    Disposition Plan:  Transfer to telemetry bed. SNF at discharge.   Possibly Friday   Consultants:  Urology  Oncology  Procedures:    Antibiotics:  Levaquin previous admission -> 12/16/12  Vancomycin 12/15/2012 - 12/18/12  Augmentin 12/18/12  HPI/Subjective: Complaining of left leg pain.    Objective: Filed Vitals:   12/18/12 0530 12/18/12 0836 12/18/12 1149 12/18/12 1408  BP: 162/63   132/70  Pulse: 84   92  Temp: 97.4 F (36.3 C)   97.9 F (36.6 C)  TempSrc: Oral     Resp: 16   18  Height:      Weight:      SpO2: 97% 96% 95% 98%    Intake/Output Summary (Last  24 hours) at 12/18/12 1522 Last data filed at 12/18/12 1300  Gross per 24 hour  Intake    1260 ml  Output   1950 ml  Net   -690 ml   Filed Weights   12/15/12 1600 12/16/12 0500 12/17/12 0500  Weight: 79 kg (174 lb 2.6 oz) 77.3 kg (170 lb 6.7 oz) 75.5 kg (166 lb 7.2 oz)    Exam:   General:  Alert, awake, appears calm  Cardiovascular: Regular rate and rhythm, no murmurs rubs or gallops, 1+ edema on left lower extremity  Respiratory: Slight anterior wheeze, no accessory muscle movement  Abdomen: Soft, slight distention, nontender, no masses, positive bowel sounds, Foley catheter still in place  Neuro:  Very hard of hearing, dysarthria slightly improved today-speech is more clear. Still with significant left sided extremity weakness.  Skin: Swelling and erythema in left upper extremity remains. erythema on the medial side of the biceps area in right upper extremity  Left lower extremity: no signs of erythema, or skin lesion  Data Reviewed: Basic Metabolic Panel:  Lab 12/18/12 1610 12/17/12 0500 12/16/12 0416 12/15/12 0343 12/14/12 0650  NA 140 139 138 140 136  K 4.1 4.1 4.8 4.5 4.8  CL 105 104 106 108 105  CO2 27 25 22 21 19   GLUCOSE 92 132* 177* 112* 137*  BUN 51* 53* 49* 49* 36*  CREATININE 1.49* 1.77* 1.83* 1.90* 1.72*  CALCIUM 8.3* 8.4 8.4 8.6 8.6  MG -- -- -- -- --  PHOS -- -- -- -- --   Liver Function Tests:  Lab 12/16/12 0416 12/14/12 0650  AST 30 26  ALT 21 16  ALKPHOS 72 78  BILITOT 0.4 0.3  PROT 5.5* 5.7*  ALBUMIN 2.5* 2.4*   CBC:  Lab 12/18/12 0355 12/17/12 0500 12/16/12 0416 12/15/12 0343 12/14/12 0650 12/13/12 0541  WBC 17.2* 14.3* 7.5 12.9* 5.9 --  NEUTROABS -- -- -- -- -- 8.0*  HGB 10.2* 9.3* 8.9* 9.2* 9.0* --  HCT 31.5* 28.3* 26.8* 28.6* 27.8* --  MCV 93.2 92.2 92.4 92.9 93.0 --  PLT 511* 462* 412* 502* 419* --   Cardiac Enzymes:  Lab 12/16/12 0415 12/15/12 2212 12/15/12 1613  CKTOTAL -- -- --  CKMB -- -- --  CKMBINDEX -- -- --  TROPONINI 0.41* 0.51* 0.55*   BNP (last 3 results)  Basename 12/16/12 0416 12/11/12 0404  12/08/12 0534  PROBNP 17611.0* 6791.0* 16208.0*   CBG: No results found for this basename: GLUCAP:5 in the last 168 hours  Recent Results (from the past 240 hour(s))  MRSA PCR SCREENING     Status: Normal   Collection Time   12/15/12  4:10 PM      Component Value Range Status Comment   MRSA by PCR NEGATIVE  NEGATIVE Final   URINE CULTURE     Status: Normal   Collection Time   12/15/12  4:10 PM      Component Value Range Status Comment   Specimen Description URINE, CLEAN CATCH   Final    Special Requests NONE   Final    Culture  Setup Time 12/16/2012 03:24   Final    Colony Count NO GROWTH   Final    Culture NO GROWTH   Final    Report Status 12/17/2012 FINAL   Final      Studies: US Carotid Duplex Bilateral  12/17/2012  *RADIOLOGY REPORT*  Clinical Data: Stroke., hypertension, previous tobacco use.  BILATERAL CAROTID DUPLEX ULTRASOUND  Technique: Wallace Cullens scale  imaging, color Doppler and duplex ultrasound was performed of bilateral carotid and vertebral arteries in the neck.  Comparison: None available  Criteria:  Quantification of carotid stenosis is based on velocity parameters that correlate the residual internal carotid diameter with NASCET-based stenosis levels, using the diameter of the distal internal carotid lumen as the denominator for stenosis measurement.  The following velocity measurements were obtained:                   PEAK SYSTOLIC/END DIASTOLIC RIGHT ICA:                        184/39cm/sec CCA:                        71/17cm/sec SYSTOLIC ICA/CCA RATIO:     2.57 DIASTOLIC ICA/CCA RATIO:    2.27 ECA:                        350cm/sec  LEFT ICA:                        130/21cm/sec CCA:                        100/10cm/sec SYSTOLIC ICA/CCA RATIO:     1.31 DIASTOLIC ICA/CCA RATIO:    2.13 ECA:                        216cm/sec  Findings:  RIGHT CAROTID ARTERY: Patchy nonocclusive plaque through the common carotid artery.  There is eccentric calcified plaque in the carotid bulb  extending to the proximal ICA.  Markedly elevated peak systolic velocities in the proximal external carotid artery with focal aliasing on color Doppler interrogation.  There are moderately increased peak systolic velocities in the ICA just distal to the plaque.  Segments of the proximal ICA are obscured by distal acoustic shadowing from plaque calcification.  RIGHT VERTEBRAL ARTERY:  Normal flow direction and waveform.  LEFT CAROTID ARTERY: Eccentric partially calcified plaque in the mid and distal common carotid artery.  Circumferential irregular plaque in the carotid bulb and proximal ICA resulting at least mild stenosis.  There is moderate stenosis at the origin of the external carotid artery with elevated peak systolic velocities.  LEFT VERTEBRAL ARTERY:  Normal flow direction and waveform.  IMPRESSION:  1.  Bilateral carotid bifurcation and proximal ICA plaque, resulting in at least 50-69% diameter stenosis on the right, less than 50% diameter stenosis on the left. The exam does not exclude plaque ulceration or embolization.  Continued surveillance recommended. 2.  Bilateral external carotid artery origin stenoses, right worse than left.   Original Report Authenticated By: D. Andria Rhein, MD    US Venous Img Lower Unilateral Left  12/18/2012  *RADIOLOGY REPORT*  Clinical Data: Left leg pain, evaluate for DVT, former smoker  LEFT LOWER EXTREMITY VENOUS DUPLEX ULTRASOUND  Technique:  Gray-scale sonography with graded compression, as well as color Doppler and duplex ultrasound were performed to evaluate the deep venous system of the lower extremity from the level of the common femoral vein through the popliteal and proximal calf veins. Spectral Doppler was utilized to evaluate flow at rest and with distal augmentation maneuvers.  Comparison:  None.  Findings:  Normal compressibility of the common femoral, superficial femoral, and popliteal veins is demonstrated, as well as the visualized proximal  calf veins.   No filling defects to suggest DVT on grayscale or color Doppler imaging.  Doppler waveforms show normal direction of venous flow, normal respiratory phasicity and response to augmentation.  IMPRESSION: No evidence of deep vein thrombosis within the left lower extremity.   Original Report Authenticated By: Tacey Ruiz, MD    Dg Chest Port 1 View  12/17/2012  *RADIOLOGY REPORT*  Clinical Data: Wheezing, elevated white blood cell count  PORTABLE CHEST - 1 VIEW  Comparison: Portable chest x-ray of 12/15/2012  Findings: The lungs are not as well aerated and there is little change to slight worsening of lower lobe opacities most consistent with pneumonia and effusions.  Mild cardiomegaly is stable.  No acute skeletal abnormality is seen.  There are degenerative changes noted in the shoulders.  IMPRESSION: Slight decrease in aeration with some worsening of bibasilar opacities most consistent with pneumonia and effusions.  An element of CHF cannot be excluded.   Original Report Authenticated By: Dwyane Dee, M.D.    Dg Swallowing Func-speech Pathology  12/18/2012  Dorene Ar, CCC-SLP     12/18/2012  2:30 PM Objective Swallowing Evaluation: Modified Barium Swallowing Study   Patient Details  Name: PRITHVI KOOI MRN: [id number] Date of Birth: [birth year]  Today's Date: 12/18/2012 Time: 3244-0102 SLP Time Calculation (min): 25 min  Past Medical History:  Past Medical History  Diagnosis Date  . Hypertension   . Arthritis   . Stroke   . Malaria   . Urothelial cancer 12/16/2012   Past Surgical History:  Past Surgical History  Procedure Date  . Hernia repair   . Cataract extraction   . Appendectomy   . Eye surgery   . Cystoscopy 12/08/2012    Procedure: CYSTOSCOPY FLEXIBLE;  Surgeon: Ky Barban,  MD;  Location: [facility];  Service: Urology;  Laterality: N/A;  . Transurethral resection of bladder tumor 12/09/2012    Procedure: TRANSURETHRAL RESECTION OF BLADDER TUMOR (TURBT);   Surgeon: Ky Barban, MD;  Location: [facility];  Service:  Urology;  Laterality: N/A;   HPI:  ELLA GUILLOTTE is a 79 y.o. male who was discharged Sunday  (TURPT by Dr. Jerre Simon). He went home, he was supposed to have a  home oxygen delivered but did not and just after midnight last  night, he became short of breath. He presented to the emergency  room with dyspnea. He has had no fever. Shortly after admission  he had a change in status and MRI showed acute non-hemorrhagic  posterior right frontal lobe small infarct. BSE completed   Tuesday with recommendation for D2/chopped with nectar-thick  liquids. RN reports that he is tolerating well but needs cues to  slow down when drinking.     Assessment / Plan / Recommendation Clinical Impression  Dysphagia Diagnosis: Mild oral phase dysphagia;Mild pharyngeal  phase dysphagia Clinical impression: Mild/mod oropharyngeal phase dysphagia in  setting of acute CVA characterized by decreased oral control of  liquid bolus, weak lingual manipulation and propulsion, premature  spillage over base of tongue into pharynx, with slight delay in  swallow initiation and decreased laryngeal closure resulting in  aspiration of thins before the swallow elicited with strong cough  (but couldn't clear entirely) and penetration of thins. Chin tuck  posture attempted, however it was difficult for pt to implement  and not consistently effective. Recommend: D3/mech soft with  nectar-thick liquids with aspiration precautions (alert and  upright for all po, take small sips/ok for straw  with single  sips, alternate solids and liquids as needed,  watch for  impulsivity/rate).     Treatment Recommendation  Therapy as outlined in treatment plan below    Diet Recommendation Dysphagia 3 (Mechanical Soft);Nectar-thick  liquid   Liquid Administration via: Cup Medication Administration: Crushed with puree Supervision: Full supervision/cueing for compensatory strategies Compensations: Slow rate;Small sips/bites;Follow solids with  liquid;Multiple dry  swallows after each bite/sip Postural Changes and/or Swallow Maneuvers: Seated upright 90  degrees;Upright 30-60 min after meal;Out of bed for meals    Other  Recommendations Oral Care Recommendations: Oral care BID Other Recommendations: Clarify dietary restrictions;Order  thickener from pharmacy   Follow Up Recommendations  Skilled Nursing facility;Inpatient Rehab    Frequency and Duration min 3x week  2 weeks      SLP Swallow Goals Patient will consume recommended diet without observed clinical  signs of aspiration with: Minimal assistance Patient will utilize recommended strategies during swallow to  increase swallowing safety with: Minimal assistance   General Date of Onset: 12/15/12 HPI: MAKYLE ESLICK is a 79 y.o. male who was discharged Sunday  (TURPT by Dr. Jerre Simon). He went home, he was supposed to have a  home oxygen delivered but did not and just after midnight last  night, he became short of breath. He presented to the emergency  room with dyspnea. He has had no fever. Shortly after admission  he had a change in status and MRI showed acute non-hemorrhagic  posterior right frontal lobe small infarct. BSE completed   Tuesday with recommendation for D2/chopped with nectar-thick  liquids. RN reports that he is tolerating well but needs cues to  slow down when drinking. Type of Study: Modified Barium Swallowing Study Reason for Referral: Objectively evaluate swallowing function Previous Swallow Assessment: BSE 12/16/12: D2/NTL Diet Prior to this Study: Dysphagia 2 (chopped);Nectar-thick  liquids Temperature Spikes Noted: No Respiratory Status: Supplemental O2 delivered via (comment) (2 L) History of Recent Intubation: No Behavior/Cognition: Alert;Cooperative;Pleasant mood;Hard of  hearing Oral Cavity - Dentition: Dentures, bottom (ill-fitting upper  dentures removed) Oral Motor / Sensory Function: Impaired - see Bedside swallow  eval Self-Feeding Abilities: Needs set up Patient Positioning: Upright in chair  Baseline Vocal Quality: Clear Volitional Cough: Strong;Congested Volitional Swallow: Able to elicit Anatomy: Within functional limits Pharyngeal Secretions: Not observed secondary MBS    Reason for Referral Objectively evaluate swallowing function   Oral Phase Oral Preparation/Oral Phase Oral Phase: Impaired Oral - Nectar Oral - Nectar Cup: Within functional limits Oral - Nectar Straw: Within functional limits Oral - Thin Oral - Thin Cup:  (premature spillage) Oral - Solids Oral - Puree: Within functional limits Oral - Mechanical Soft: Weak lingual manipulation;Reduced  posterior propulsion;Lingual/palatal residue;Piecemeal swallowing Oral - Pill: Reduced posterior propulsion (Pt unable to propel  pill posteriorly with NTL, expectorated) Oral Phase - Comment Oral Phase - Comment: Decreased control with premature spillage  over base of tongue especially with liquids   Pharyngeal Phase Pharyngeal Phase Pharyngeal Phase: Impaired Pharyngeal - Nectar Pharyngeal - Nectar Cup: Premature spillage to  valleculae;Premature spillage to pyriform sinuses;Pharyngeal  residue - valleculae;Lateral channel residue;Delayed swallow  initiation (trace residuals, benefits from repeat swallow) Pharyngeal - Nectar Straw: Delayed swallow initiation;Premature  spillage to valleculae;Premature spillage to pyriform  sinuses;Reduced epiglottic inversion;Reduced airway/laryngeal  closure;Penetration/Aspiration during swallow;Lateral channel  residue;Pharyngeal residue - valleculae Penetration/Aspiration details (nectar straw): Material does not  enter airway;Material enters airway, remains ABOVE vocal cords  then ejected out;Material enters airway, remains ABOVE vocal  cords and  not ejected out Pharyngeal - Thin Pharyngeal - Thin Cup: Delayed swallow initiation;Premature  spillage to pyriform sinuses;Premature spillage to  valleculae;Reduced epiglottic inversion;Reduced airway/laryngeal  closure;Penetration/Aspiration before   swallow;Penetration/Aspiration during swallow;Trace  aspiration;Moderate aspiration;Pharyngeal residue -  valleculae;Compensatory strategies attempted (Comment);Lateral  channel residue (Chin tuck, very difficult for him to hold bolus  orally befor) Penetration/Aspiration details (thin cup): Material enters  airway, passes BELOW cords and not ejected out despite cough  attempt by patient;Material enters airway, CONTACTS cords then  ejected out;Material enters airway, remains ABOVE vocal cords  then ejected out Pharyngeal - Solids Pharyngeal - Puree: Within functional limits Pharyngeal - Mechanical Soft: Within functional limits Pharyngeal Phase - Comment Pharyngeal Comment: Premature spillage with liquids with slight  delay in swallow initiation  Cervical Esophageal Phase     Thank you,  Havery Moros, CCC-SLP [phone]   Cervical Esophageal Phase Cervical Esophageal Phase: [facility]         PORTER,DABNEY 12/18/2012, 2:28 PM      Scheduled Meds:    . albuterol  2.5 mg Nebulization Q4H  . amLODipine  5 mg Oral Daily  . amoxicillin-clavulanate  875 mg Oral Q12H  . antiseptic oral rinse  15 mL Mouth Rinse BID  . furosemide  20 mg Oral Daily  . iron polysaccharides  150 mg Oral Daily  . latanoprost  1 drop Both Eyes QHS  . levothyroxine  75 mcg Oral QAC breakfast  . simvastatin  20 mg Oral q1800   Continuous Infusions:    . sodium chloride 20 mL/hr at 12/17/12 0900    Principal Problem:  *Atelectasis Active Problems:  Bladder cancer  Acute renal failure  Hypertension  CVA (cerebral vascular accident)  Acute diastolic heart failure    Time spent: 40 min.    Conley Canal  [facility] Pager [phone]. If 8PM-8AM, please contact night-coverage at [email or web address], password [facility] 12/18/2012, 3:22 PM  LOS: 5 days   Patient interviewed and examined.  Will monitor for another 24 hours, then hopefully to SNF tomorrow if stable. Crista Curb, M.D.

## 2012-12-18 NOTE — Progress Notes (Signed)
Subjective: Patient seen in bed.  No family at the bedside.   He denies any complaints and he understands that he is going to be discharged, likely, tomorrow to a rehabilitation facility under skilled nursing care.   I discussed the patient's case with Dr. Thersa Salt (Rad Onc) and he is agreeable to see the patient and consultation for consideration of radiation therapy for his Stage II Urothelial Carcinoma.  Dr. Thersa Salt is aware that a PET scan has not been performed.  Objective: Vital signs in last 24 hours: Temp:  [97.4 F (36.3 C)-97.9 F (36.6 C)] 97.9 F (36.6 C) (01/23 1408) Pulse Rate:  [84-92] 92  (01/23 1408) Resp:  [16-18] 18  (01/23 1408) BP: (132-162)/(56-70) 132/70 mmHg (01/23 1408) SpO2:  [90 %-98 %] 95 % (01/23 1519)  Intake/Output from previous day: 01/22 0800 - 01/23 0759 In: 940 [P.O.:480; I.V.:460] Out: 1550 [Urine:1550] Intake/Output this shift: Total I/O In: 600 [P.O.:600] Out: 400 [Urine:400]  General appearance: alert, cooperative, appears stated age and dysarthria Extremities: unable to raise left arm, decreased dorsiflexion and extension of foot, decreased strength in left LE.  Lab Results:   Basename 12/18/12 0355 12/17/12 0500  WBC 17.2* 14.3*  HGB 10.2* 9.3*  HCT 31.5* 28.3*  PLT 511* 462*   BMET  Basename 12/18/12 0355 12/17/12 0500  NA 140 139  K 4.1 4.1  CL 105 104  CO2 27 25  GLUCOSE 92 132*  BUN 51* 53*  CREATININE 1.49* 1.77*  CALCIUM 8.3* 8.4    Studies/Results: US Carotid Duplex Bilateral  12/17/2012  *RADIOLOGY REPORT*  Clinical Data: Stroke., hypertension, previous tobacco use.  BILATERAL CAROTID DUPLEX ULTRASOUND  Technique: Wallace Cullens scale imaging, color Doppler and duplex ultrasound was performed of bilateral carotid and vertebral arteries in the neck.  Comparison: None available  Criteria:  Quantification of carotid stenosis is based on velocity parameters that correlate the residual internal carotid diameter with NASCET-based  stenosis levels, using the diameter of the distal internal carotid lumen as the denominator for stenosis measurement.  The following velocity measurements were obtained:                   PEAK SYSTOLIC/END DIASTOLIC RIGHT ICA:                        184/39cm/sec CCA:                        71/17cm/sec SYSTOLIC ICA/CCA RATIO:     2.57 DIASTOLIC ICA/CCA RATIO:    2.27 ECA:                        350cm/sec  LEFT ICA:                        130/21cm/sec CCA:                        100/10cm/sec SYSTOLIC ICA/CCA RATIO:     1.31 DIASTOLIC ICA/CCA RATIO:    2.13 ECA:                        216cm/sec  Findings:  RIGHT CAROTID ARTERY: Patchy nonocclusive plaque through the common carotid artery.  There is eccentric calcified plaque in the carotid bulb extending to the proximal ICA.  Markedly elevated peak systolic velocities in the proximal external carotid artery  with focal aliasing on color Doppler interrogation.  There are moderately increased peak systolic velocities in the ICA just distal to the plaque.  Segments of the proximal ICA are obscured by distal acoustic shadowing from plaque calcification.  RIGHT VERTEBRAL ARTERY:  Normal flow direction and waveform.  LEFT CAROTID ARTERY: Eccentric partially calcified plaque in the mid and distal common carotid artery.  Circumferential irregular plaque in the carotid bulb and proximal ICA resulting at least mild stenosis.  There is moderate stenosis at the origin of the external carotid artery with elevated peak systolic velocities.  LEFT VERTEBRAL ARTERY:  Normal flow direction and waveform.  IMPRESSION:  1.  Bilateral carotid bifurcation and proximal ICA plaque, resulting in at least 50-69% diameter stenosis on the right, less than 50% diameter stenosis on the left. The exam does not exclude plaque ulceration or embolization.  Continued surveillance recommended. 2.  Bilateral external carotid artery origin stenoses, right worse than left.   Original Report Authenticated By:  D. Andria Rhein, MD    US Venous Img Lower Unilateral Left  12/18/2012  *RADIOLOGY REPORT*  Clinical Data: Left leg pain, evaluate for DVT, former smoker  LEFT LOWER EXTREMITY VENOUS DUPLEX ULTRASOUND  Technique:  Gray-scale sonography with graded compression, as well as color Doppler and duplex ultrasound were performed to evaluate the deep venous system of the lower extremity from the level of the common femoral vein through the popliteal and proximal calf veins. Spectral Doppler was utilized to evaluate flow at rest and with distal augmentation maneuvers.  Comparison:  None.  Findings:  Normal compressibility of the common femoral, superficial femoral, and popliteal veins is demonstrated, as well as the visualized proximal calf veins.  No filling defects to suggest DVT on grayscale or color Doppler imaging.  Doppler waveforms show normal direction of venous flow, normal respiratory phasicity and response to augmentation.  IMPRESSION: No evidence of deep vein thrombosis within the left lower extremity.   Original Report Authenticated By: Tacey Ruiz, MD    Dg Chest Port 1 View  12/17/2012  *RADIOLOGY REPORT*  Clinical Data: Wheezing, elevated white blood cell count  PORTABLE CHEST - 1 VIEW  Comparison: Portable chest x-ray of 12/15/2012  Findings: The lungs are not as well aerated and there is little change to slight worsening of lower lobe opacities most consistent with pneumonia and effusions.  Mild cardiomegaly is stable.  No acute skeletal abnormality is seen.  There are degenerative changes noted in the shoulders.  IMPRESSION: Slight decrease in aeration with some worsening of bibasilar opacities most consistent with pneumonia and effusions.  An element of CHF cannot be excluded.   Original Report Authenticated By: Dwyane Dee, M.D.    Dg Swallowing Func-speech Pathology  12/18/2012  Dorene Ar, CCC-SLP     12/18/2012  2:30 PM Objective Swallowing Evaluation: Modified Barium Swallowing Study    Patient Details  Name: Reginald Waller MRN: [id number] Date of Birth: [birth year]  Today's Date: 12/18/2012 Time: 1308-6578 SLP Time Calculation (min): 25 min  Past Medical History:  Past Medical History  Diagnosis Date  . Hypertension   . Arthritis   . Stroke   . Malaria   . Urothelial cancer 12/16/2012   Past Surgical History:  Past Surgical History  Procedure Date  . Hernia repair   . Cataract extraction   . Appendectomy   . Eye surgery   . Cystoscopy 12/08/2012    Procedure: CYSTOSCOPY FLEXIBLE;  Surgeon: Ky Barban,  MD;  Location:  [facility];  Service: Urology;  Laterality: N/A;  . Transurethral resection of bladder tumor 12/09/2012    Procedure: TRANSURETHRAL RESECTION OF BLADDER TUMOR (TURBT);   Surgeon: Ky Barban, MD;  Location: [facility];  Service:  Urology;  Laterality: N/A;   HPI:  STYLIANOS STRADLING is a 79 y.o. male who was discharged Sunday  (TURPT by Dr. Jerre Simon). He went home, he was supposed to have a  home oxygen delivered but did not and just after midnight last  night, he became short of breath. He presented to the emergency  room with dyspnea. He has had no fever. Shortly after admission  he had a change in status and MRI showed acute non-hemorrhagic  posterior right frontal lobe small infarct. BSE completed   Tuesday with recommendation for D2/chopped with nectar-thick  liquids. RN reports that he is tolerating well but needs cues to  slow down when drinking.     Assessment / Plan / Recommendation Clinical Impression  Dysphagia Diagnosis: Mild oral phase dysphagia;Mild pharyngeal  phase dysphagia Clinical impression: Mild/mod oropharyngeal phase dysphagia in  setting of acute CVA characterized by decreased oral control of  liquid bolus, weak lingual manipulation and propulsion, premature  spillage over base of tongue into pharynx, with slight delay in  swallow initiation and decreased laryngeal closure resulting in  aspiration of thins before the swallow elicited with strong cough  (but couldn't  clear entirely) and penetration of thins. Chin tuck  posture attempted, however it was difficult for pt to implement  and not consistently effective. Recommend: D3/mech soft with  nectar-thick liquids with aspiration precautions (alert and  upright for all po, take small sips/ok for straw with single  sips, alternate solids and liquids as needed,  watch for  impulsivity/rate).     Treatment Recommendation  Therapy as outlined in treatment plan below    Diet Recommendation Dysphagia 3 (Mechanical Soft);Nectar-thick  liquid   Liquid Administration via: Cup Medication Administration: Crushed with puree Supervision: Full supervision/cueing for compensatory strategies Compensations: Slow rate;Small sips/bites;Follow solids with  liquid;Multiple dry swallows after each bite/sip Postural Changes and/or Swallow Maneuvers: Seated upright 90  degrees;Upright 30-60 min after meal;Out of bed for meals    Other  Recommendations Oral Care Recommendations: Oral care BID Other Recommendations: Clarify dietary restrictions;Order  thickener from pharmacy   Follow Up Recommendations  Skilled Nursing facility;Inpatient Rehab    Frequency and Duration min 3x week  2 weeks      SLP Swallow Goals Patient will consume recommended diet without observed clinical  signs of aspiration with: Minimal assistance Patient will utilize recommended strategies during swallow to  increase swallowing safety with: Minimal assistance   General Date of Onset: 12/15/12 HPI: JOBY RICHART is a 79 y.o. male who was discharged Sunday  (TURPT by Dr. Jerre Simon). He went home, he was supposed to have a  home oxygen delivered but did not and just after midnight last  night, he became short of breath. He presented to the emergency  room with dyspnea. He has had no fever. Shortly after admission  he had a change in status and MRI showed acute non-hemorrhagic  posterior right frontal lobe small infarct. BSE completed   Tuesday with recommendation for D2/chopped with  nectar-thick  liquids. RN reports that he is tolerating well but needs cues to  slow down when drinking. Type of Study: Modified Barium Swallowing Study Reason for Referral: Objectively evaluate swallowing function Previous Swallow Assessment: BSE 12/16/12: D2/NTL Diet Prior to  this Study: Dysphagia 2 (chopped);Nectar-thick  liquids Temperature Spikes Noted: No Respiratory Status: Supplemental O2 delivered via (comment) (2 L) History of Recent Intubation: No Behavior/Cognition: Alert;Cooperative;Pleasant mood;Hard of  hearing Oral Cavity - Dentition: Dentures, bottom (ill-fitting upper  dentures removed) Oral Motor / Sensory Function: Impaired - see Bedside swallow  eval Self-Feeding Abilities: Needs set up Patient Positioning: Upright in chair Baseline Vocal Quality: Clear Volitional Cough: Strong;Congested Volitional Swallow: Able to elicit Anatomy: Within functional limits Pharyngeal Secretions: Not observed secondary MBS    Reason for Referral Objectively evaluate swallowing function   Oral Phase Oral Preparation/Oral Phase Oral Phase: Impaired Oral - Nectar Oral - Nectar Cup: Within functional limits Oral - Nectar Straw: Within functional limits Oral - Thin Oral - Thin Cup:  (premature spillage) Oral - Solids Oral - Puree: Within functional limits Oral - Mechanical Soft: Weak lingual manipulation;Reduced  posterior propulsion;Lingual/palatal residue;Piecemeal swallowing Oral - Pill: Reduced posterior propulsion (Pt unable to propel  pill posteriorly with NTL, expectorated) Oral Phase - Comment Oral Phase - Comment: Decreased control with premature spillage  over base of tongue especially with liquids   Pharyngeal Phase Pharyngeal Phase Pharyngeal Phase: Impaired Pharyngeal - Nectar Pharyngeal - Nectar Cup: Premature spillage to  valleculae;Premature spillage to pyriform sinuses;Pharyngeal  residue - valleculae;Lateral channel residue;Delayed swallow  initiation (trace residuals, benefits from repeat swallow)  Pharyngeal - Nectar Straw: Delayed swallow initiation;Premature  spillage to valleculae;Premature spillage to pyriform  sinuses;Reduced epiglottic inversion;Reduced airway/laryngeal  closure;Penetration/Aspiration during swallow;Lateral channel  residue;Pharyngeal residue - valleculae Penetration/Aspiration details (nectar straw): Material does not  enter airway;Material enters airway, remains ABOVE vocal cords  then ejected out;Material enters airway, remains ABOVE vocal  cords and not ejected out Pharyngeal - Thin Pharyngeal - Thin Cup: Delayed swallow initiation;Premature  spillage to pyriform sinuses;Premature spillage to  valleculae;Reduced epiglottic inversion;Reduced airway/laryngeal  closure;Penetration/Aspiration before  swallow;Penetration/Aspiration during swallow;Trace  aspiration;Moderate aspiration;Pharyngeal residue -  valleculae;Compensatory strategies attempted (Comment);Lateral  channel residue (Chin tuck, very difficult for him to hold bolus  orally befor) Penetration/Aspiration details (thin cup): Material enters  airway, passes BELOW cords and not ejected out despite cough  attempt by patient;Material enters airway, CONTACTS cords then  ejected out;Material enters airway, remains ABOVE vocal cords  then ejected out Pharyngeal - Solids Pharyngeal - Puree: Within functional limits Pharyngeal - Mechanical Soft: Within functional limits Pharyngeal Phase - Comment Pharyngeal Comment: Premature spillage with liquids with slight  delay in swallow initiation  Cervical Esophageal Phase     Thank you,  Havery Moros, CCC-SLP [phone]   Cervical Esophageal Phase Cervical Esophageal Phase: [facility]         PORTER,DABNEY 12/18/2012, 2:28 PM      Medications: I have reviewed the patient's current medications.  Assessment/Plan: 1. High grade, urothelial cancer invading the muscular propria, S/P TURBT on 12/09/2012 by Dr. Jerre Simon.  Case discussed with Dr. Thersa Salt (Rad Onc).  He would like to see the patient  in 2 weeks in consultation.  A referral to his office has been made by the [facility].  We do not have the appointment date and time presently. Will add to follow-up instructions when I am informed of this appointment. 2. Acute non-hemorrhagic posterior right frontal lobe small infarct causing left sided weakness, dysarthria, and left facial droop.  3. B/L carotid plaque causing 50-69% stenosis. 4. Severe mitral annular calcification, per CT  5. Dehydration, BUN: Creatinine ration is > 20 at 34.2.  Creatinine is stable.  6. Anemia, likely secondary to acute  blood loss +/- heart failure +/- comorbidities.  Anemia panel reveals an anemia of chronic disease picture.   7. Thrombocytosis at 511,000. No intervention required.  8. Acute diastolic heart failure, BNP > 40,981  9. Elevated troponin  10. Left arm cellulitis  11. Left sided weakeness   Patient and plan will be discussed with Dr. Mariel Sleet in the near future.     LOS: 5 days    KEFALAS,THOMAS 12/18/2012

## 2012-12-18 NOTE — Progress Notes (Signed)
12/18/12 1634 Patient voiding bloody urine today, MD aware. Dr Jerre Simon to see patient after he is through with surgeries for the day. Bladder scan >999 ml per nurse tech. Paged Algis Downs, PA to notify, received call from Dr Jerre Simon. Stated aware of bloody urine, notified of bladder scan residual, order received to insert foley catheter. Received call from [place], [place] as well. Agreed with plan for foley catheter. Notified her of patient c/o constipation, stated would order colace. 14 french foley catheter inserted per M. Rubye Oaks, RN, pt tolerated well. 300 ml bloody urine output returned after insertion. Nursing to monitor. Earnstine Regal, RN

## 2012-12-18 NOTE — Progress Notes (Signed)
UR Chart Review Completed  

## 2012-12-18 NOTE — Progress Notes (Signed)
Note [id number]

## 2012-12-18 NOTE — Progress Notes (Signed)
Physical Therapy Treatment Patient Details Name: Reginald Waller MRN: [id number] DOB: 12/22/1918 Today's Date: 12/18/2012 Time: 1400-1420 PT Time Calculation (min): 20 min  PT Assessment / Plan / Recommendation Comments on Treatment Session  Pt continues to require max assist for STS. Pt tends to lean posteriorly when sitting at edge of bed. Pt able to sit at EOB x 1' independently before beginning to lean. Pt is cooperative and eager to participate in therapy. Pt required assistance with L LE exercises secondary to weakness.                      Plan  (Continue to progress per PT POC.)           Mobility  Transfers Transfers: Sit to Stand;Stand to Sit Sit to Stand: 2: Max assist Stand to Sit: 2: Max assist Details for Transfer Assistance: Cuing for hand placement; completed twice Ambulation/Gait Ambulation/Gait Assistance: Not tested (comment)    Exercises General Exercises - Lower Extremity Long Arc Quad: AROM;Both;10 reps;Seated Heel Slides: AROM;AAROM;Both;10 reps;Supine (AA on L) Hip ABduction/ADduction: AROM;Both;10 reps;Supine Straight Leg Raises: AROM;AAROM;Both;10 reps;Supine (AA on L)    Visit Information  Last PT Received On: 12/18/12    Subjective Data  Subjective: Pt states that his L hip cramps up from time to time.         End of Session PT - End of Session Equipment Utilized During Treatment: Gait belt Activity Tolerance: Patient limited by fatigue Patient left: in bed;with call bell/phone within reach;with bed alarm set   Seth Bake, PTA 12/18/2012, 2:54 PM

## 2012-12-18 NOTE — Progress Notes (Signed)
12/18/12 1848 Patient to have foley catheter irrigated until clear per Dr Jerre Simon instructions. Stated patient should keep foley catheter in place at discharge until follow-up with Dr Jerre Simon in one week from discharge. Earnstine Regal, RN

## 2012-12-18 NOTE — Clinical Social Work Placement (Signed)
    Clinical Social Work Department CLINICAL SOCIAL WORK PLACEMENT NOTE 12/19/2012  Patient:  Reginald Waller, Reginald Waller  Account Number:  [id number] Admit date:  12/13/2012  Clinical Social Worker:  Santa Genera, CLINICAL SOCIAL WORKER  Date/time:  12/17/2012 10:30 AM  Clinical Social Work is seeking post-discharge placement for this patient at the following level of care:   SKILLED NURSING   (*CSW will update this form in Epic as items are completed)   12/17/2012  Patient/family provided with [facility] Department of Clinical Social Work's list of facilities offering this level of care within the geographic area requested by the patient (or if unable, by the patient's family).  12/17/2012  Patient/family informed of their freedom to choose among providers that offer the needed level of care, that participate in Medicare, Medicaid or managed care program needed by the patient, have an available bed and are willing to accept the patient.  12/17/2012  Patient/family informed of [facility]' ownership interest in [facility], as well as of the fact that they are under no obligation to receive care at this facility.  PASARR submitted to EDS on 12/16/2012 PASARR number received from EDS on 12/16/2012  FL2 transmitted to all facilities in geographic area requested by pt/family on  12/17/2012 FL2 transmitted to all facilities within larger geographic area on   Patient informed that his/her managed care company has contracts with or will negotiate with  certain facilities, including the following:     Patient/family informed of bed offers received:  12/18/2012 Patient chooses bed at [name] Physician recommends and patient chooses bed at    Patient to be transferred to [facility] on  12/19/2012 Patient to be transferred to facility by [facility] staff via tunnel  The following physician request were entered in Epic:   Additional Comments:  Santa Genera,  LCSW Clinical Social Worker [phone])

## 2012-12-18 NOTE — Clinical Social Work Note (Signed)
Patient and family given bed offers received ([facility], [place], [facility], [place], and [facility]).  Patient chose [facility], [facility] at [facility] notified, family asked to contact facility to complete preadmission paperwork.    Santa Genera, LCSW Clinical Social Worker [phone])

## 2012-12-19 ENCOUNTER — Inpatient Hospital Stay
Admission: RE | Admit: 2012-12-19 | Discharge: 2013-02-09 | Disposition: A | Discharge status: Home / Self Care | Payer: PRIVATE HEALTH INSURANCE | Source: Skilled Nursing Facility | Attending: Internal Medicine | Admitting: Internal Medicine

## 2012-12-19 DIAGNOSIS — R05 Cough: Secondary | ICD-10-CM

## 2012-12-19 DIAGNOSIS — M25559 Pain in unspecified hip: Principal | ICD-10-CM

## 2012-12-19 LAB — CBC
Hemoglobin: 9.7 g/dL — ABNORMAL LOW (ref 13.0–17.0)
RBC: 3.18 MIL/uL — ABNORMAL LOW (ref 4.22–5.81)
WBC: 15.5 10*3/uL — ABNORMAL HIGH (ref 4.0–10.5)

## 2012-12-19 LAB — BASIC METABOLIC PANEL
GFR calc Af Amer: 51 mL/min — ABNORMAL LOW (ref >90)
GFR calc non Af Amer: 44 mL/min — ABNORMAL LOW (ref >90)
Potassium: 3.9 mEq/L (ref 3.5–5.1)
Sodium: 143 mEq/L (ref 135–145)

## 2012-12-19 LAB — HEMOGLOBIN A1C
Hgb A1c MFr Bld: 5.5 %
Mean Plasma Glucose: 111 mg/dL

## 2012-12-19 MED ORDER — POLYSACCHARIDE IRON COMPLEX 150 MG PO CAPS: 150.0000 mg | ORAL_CAPSULE | Freq: Every day | ORAL | Status: DC

## 2012-12-19 MED ORDER — STARCH (THICKENING) PO POWD: 1.0000 g | ORAL | Status: DC | PRN

## 2012-12-19 MED ORDER — LORAZEPAM 0.5 MG PO TABS: 0.2500 mg | ORAL_TABLET | Freq: Four times a day (QID) | ORAL | Status: DC | PRN

## 2012-12-19 MED ORDER — LEVOTHYROXINE SODIUM 100 MCG PO TABS: 100.0000 ug | ORAL_TABLET | Freq: Every morning | ORAL | Status: DC

## 2012-12-19 MED ORDER — ASPIRIN EC 325 MG PO TBEC: 325.0000 mg | DELAYED_RELEASE_TABLET | Freq: Every day | ORAL | Status: DC

## 2012-12-19 MED ORDER — MUSCLE RUB 10-15 % EX CREA: 1.0000 "application " | TOPICAL_CREAM | CUTANEOUS | Status: DC | PRN

## 2012-12-19 MED ORDER — DSS 100 MG PO CAPS: 200.0000 mg | ORAL_CAPSULE | Freq: Every day | ORAL | Status: DC

## 2012-12-19 MED ORDER — AMLODIPINE BESYLATE 5 MG PO TABS: 10.0000 mg | ORAL_TABLET | Freq: Every day | ORAL | Status: DC

## 2012-12-19 MED ORDER — AMOXICILLIN-POT CLAVULANATE 875-125 MG PO TABS: 1.0000 | ORAL_TABLET | Freq: Two times a day (BID) | ORAL | Status: DC

## 2012-12-19 MED ORDER — FUROSEMIDE 20 MG PO TABS: 20.0000 mg | ORAL_TABLET | Freq: Every day | ORAL | Status: DC

## 2012-12-19 NOTE — Clinical Social Work Note (Signed)
Reviewed chart, updated [facility] that patient will likely discharge w Foley in place, with need for Foley catheter to be irrigated.  Per admissions, facility is willing to accept patient w these care needs.  Santa Genera, LCSW Clinical Social Worker [phone])

## 2012-12-19 NOTE — Progress Notes (Signed)
General status looks fine  Drugs of Abuse  No results found for this basename: labopia, cocainscrnur, labbenz, amphetmu, thcu, labbarb    Looks sligtly brownish.can be discharged will see in office in 2 weeks.

## 2012-12-19 NOTE — Progress Notes (Signed)
Pt catheter irrigated around 2100 until clear.  Still had clots and blood in foley tube.  Irrigated again about 0100 until clear. Still bloody. Irrigated again at 0530, blood still in foley tubing as of end of shift.

## 2012-12-19 NOTE — Progress Notes (Signed)
Report called to [facility] at the [facility]. Caregiver verbalized understanding of report. Pt to be transferred via bed to [facility]. Pt a/o. Vss. Saline lock removed. Foley intact.

## 2012-12-19 NOTE — Progress Notes (Signed)
NAME:  RANSOM, NICKSON NO.:  [id number]  MEDICAL RECORD NO.:  [id number]  LOCATION:                                 FACILITY:  PHYSICIAN:  Ky Barban, M.D.DATE OF BIRTH:  05/28/1919  DATE OF PROCEDURE:  12/18/2012 DATE OF DISCHARGE:                                PROGRESS NOTE   Mr. 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  Postoperatively, he has gone into retention. He had a Foley catheter.  It was taken out today but I did not know that he has already been started on heparin and aspirin.  He is having gross hematuria and after that they have stopped his heparin and aspirin.  He cannot take it.  It is too early to take it and he told me he is having difficulty to void.  His residual urine was 900 mL.  In the meantime, he also has developed stroke with left-sided weakness.  We put the Foley catheter back in, and I have suggested that he keep the Foley catheter. He will be going for physiotherapy.  He can go up with a Foley catheter, and I will see him back in couple of weeks in the office.  Down the road, I want to do cystoscopy and see why he cannot void.  Before that, I want to give him 1 more voiding trial because it could because of he had a hematuria and blood clot could be that causing obstruction.  I have reviewed the consult note from oncologist.  They do not think he is a candidate for chemotherapy and we will have to get radiotherapist to look at him.  Otherwise, he is fully conscious, alert, oriented.     Ky Barban, M.D.     MIJ/MEDQ  D:  12/18/2012  T:  12/19/2012  Job:  [id number]

## 2012-12-19 NOTE — Discharge Instructions (Signed)
STROKE/TIA DISCHARGE INSTRUCTIONS SMOKING Cigarette smoking nearly doubles your risk of having a stroke & is the single most alterable risk factor  If you smoke or have smoked in the last 12 months, you are advised to quit smoking for your health.  Most of the excess cardiovascular risk related to smoking disappears within a year of stopping.  Ask you doctor about anti-smoking medications  [place] Quit Line: 1-800-QUIT NOW  Free Smoking Cessation Classes [phone]  CHOLESTEROL Know your levels; limit fat & cholesterol in your diet  Lipid Panel     Component Value Date/Time   CHOL 120 12/16/2012 0415   TRIG 63 12/16/2012 0415   HDL 49 12/16/2012 0415   CHOLHDL 2.4 12/16/2012 0415   VLDL 13 12/16/2012 0415   LDLCALC 58 12/16/2012 0415      Many patients benefit from treatment even if their cholesterol is at goal.  Goal: Total Cholesterol (CHOL) less than 160  Goal:  Triglycerides (TRIG) less than 150  Goal:  HDL greater than 40  Goal:  LDL (LDLCALC) less than 100   BLOOD PRESSURE American Stroke Association blood pressure target is less that 120/80 mm/Hg  Your discharge blood pressure is:  BP: 124/58 mmHg  Monitor your blood pressure  Limit your salt and alcohol intake  Many individuals will require more than one medication for high blood pressure  DIABETES (A1c is a blood sugar average for last 3 months) Goal HGBA1c is under 7% (HBGA1c is blood sugar average for last 3 months)  Diabetes: {STROKE DC DIABETES:22357}    No results found for this basename: HGBA1C     Your HGBA1c can be lowered with medications, healthy diet, and exercise.  Check your blood sugar as directed by your physician  Call your physician if you experience unexplained or low blood sugars.  PHYSICAL ACTIVITY/REHABILITATION Goal is 30 minutes at least 4 days per week    {STROKE DC ACTIVITY/REHAB:22359}  Activity decreases your risk of heart attack and stroke and makes your heart stronger.  It helps  control your weight and blood pressure; helps you relax and can improve your mood.  Participate in a regular exercise program.  Talk with your doctor about the best form of exercise for you (dancing, walking, swimming, cycling).  DIET/WEIGHT Goal is to maintain a healthy weight  Your discharge diet is: Dysphagia 3 with nectar thick liquids Your height is:  Height: 5\' 11"  (180.3 cm) Your current weight is: Weight: 75.5 kg (166 lb 7.2 oz) Your Body Mass Index (BMI) is:  BMI (Calculated): 24.3   Following the type of diet specifically designed for you will help prevent another stroke.  Your goal weight range is:    Your goal Body Mass Index (BMI) is 19-24.  Healthy food habits can help reduce 3 risk factors for stroke:  High cholesterol, hypertension, and excess weight.  RESOURCES Stroke/Support Group:  Call [phone]  they meet the 3rd Sunday of the month on the Rehab Unit at [facility], [place] ( no meetings June, July & Aug).  STROKE EDUCATION PROVIDED/REVIEWED AND GIVEN TO PATIENT Stroke warning signs and symptoms How to activate emergency medical system (call 911). Medications prescribed at discharge. Need for follow-up after discharge. Personal risk factors for stroke. Pneumonia vaccine given:   {STROKE DC YES/NO/DATE:22363} Flu vaccine given:   {STROKE DC YES/NO/DATE:22363} My questions have been answered, the writing is legible, and I understand these instructions.  I will adhere to these goals & educational materials that have been  provided to me after my discharge from the hospital.

## 2012-12-19 NOTE — Discharge Summary (Signed)
Physician Discharge Summary  Reginald Waller AVW:[id number] DOB: 1919/07/16 DOA: 12/13/2012  PCP: Kirk Ruths, MD  Admit date: 12/13/2012 Discharge date: 12/19/2012  Time spent: 60 minutes  Recommendations for Outpatient Follow-up:   Reginald Waller will need extensive physical therapy, speech therapy, and occupational therapy at the skilled nursing rehabilitation Center prior to returning to his own home.  Aspiration and fall precautions are needed  Please check white count, hemoglobin, and creatinine as well as volume status On Monday, January 27.  Discharge Diagnoses:  Principal Problem:  *Acute diastolic heart failure Active Problems:  CVA (cerebral vascular accident)  Pneumonia  Bladder cancer  Acute renal failure  Hypertension  Acute blood loss anemia  Left arm cellulitis  Atelectasis   Discharge Condition: stable.  With some residual dsyarthia and left sided extremity weakness.  Two person assist.  Needs aspiration and fall precautions.  Diet recommendation: dysphagia 3 (mech soft) with nectar thick liquids, low sodium.  Filed Weights   12/15/12 1600 12/16/12 0500 12/17/12 0500  Weight: 79 kg (174 lb 2.6 oz) 77.3 kg (170 lb 6.7 oz) 75.5 kg (166 lb 7.2 oz)    History of present illness at the time of admission:  Reginald Waller is a 79 year old gentleman who was discharged from [facility] on 12/12/2012 after having been diagnosed with high-grade urothelial cancer and undergoing TURBT. Also during that hospitalization he experienced acute diastolic heart failure, left upper extremity cellulitis, and bilateral pleural effusions with questionable bronchospasm versus pneumonia. He returned to the ED on January 18 with dyspnea and shortness of breath. No signs of pneumonia were found at that time, rather simply atelectasis. But he was requiring oxygen via nasal cannula to maintain an appropriate oxygen saturation so he was admitted.  Hospital Course:   Shortness  of breath Initially improved, he then developed respiratory distress on Monday, January 20. He was treated supportively and given a small dose of morphine to calm him and eased breathing.  BNP was elevated at 17,611 he was felt to be in volume overload with acute diastolic heart failure. He was treated with IV Lasix. The patient had had a recent pneumonia so his chest x-ray was equivocal. His respiratory distress resolved. He has had occasional wheezing throughout his hospitalization this is been treated with nebulizer treatments, and 20 mg of Lasix by mouth daily. His breathing is now completely stable.  Acute nonhemorrhagic posterior right frontal lobe infarct At approximately 1:45 PM on January 20 Reginald Waller was noted to have left-sided facial droop slurred speech and was acting noticeably different from baseline. On MRI he was found to have right frontal lobe infarct. TPA was not ordered due to recent surgical procedure and continued hematuria. He was transferred to the ICU and closely monitored. His lipids were found to be essentially normal, carotid Dopplers demonstrated noncritical bilateral stenosis at the carotid artery origin right greater than left. 2-D echo was not repeated as it had been recently done in January 2014. His dysarthria, left-sided extremity weakness and slight facial droop persist but have significantly improved since the stroke. He was evaluated by physical therapy, found to be a two-person assist, and skilled nursing rehabilitation was recommended. On speech therapy evaluation he was found to aspirate thin liquids. He requires a dysphagia 3 diet, nectar thick liquids, aspiration precautions. Aspirin and anticoagulation was initially stopped do to hematuria. On the day of discharge we will resume his 325 mg aspirin daily. We are hopeful that he will continue to improve and  return to his own home with his wife at some point.   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 Jerre Simon in early January. He experienced continued urinary retention and required Foley catheter. The day prior to discharge (January 23) a voiding trial was initiated and the catheter was removed. The patient developed bleeding from his urethra, and was again unable to void. Bladder scan demonstrated urinary retention of greater than 999 mL. A Foley catheter was reinserted. Dr. Jerre Simon was re-consulted. He recommended Foley urination and full clear. The Foley will remain in place. He will see Dr. Jerre Simon in followup in one week.  Oncology was consulted to provide recommendations regarding prognosis and potential treatment options. They felt that he was not a candidate for chemotherapy but would refer him to radiation oncology for evaluation. He has not yet had a PET scan, as he has been an inpatient. The cancer Center has arranged a followup appointment with Dr. Thersa Salt in 2 weeks, date and time currently pending.  Leukocytosis of uncertain etiology Between January 21 and January 22 the patient's white count climbed from 7.5-17.2.  He did not have a fever, his chest x-ray was equivocal, urine culture was negative, clinical exam was unrevealing.  At the time the patient was being treated with IV vancomycin.  We monitored him clinically he remained stable without signs of infection. His antibiotics were changed to oral Augmentin on January 23. On January 24 his white count is trending down.  We will continue Augmentin for 4 more days.  Elevated troponin  Troponins were cycled during the patient's CVA event.  Peak was 0.55.    EKG unchanged from previous. No evidence of acute coronary syndrome. Likely related to renal insufficiency and acute on chronic diastolic heart failure.  Acute diastolic heart failure. On January 20 patient was observed to have fluid overload evidenced by pitting edema and shortness of breath. He was treated with IV Lasix and had significant  improvement by January 21. His respiratory distress had resolved. Recent echo 12/08/12 showed LVEF of 60-65% with mild concentric hypertrophy. Recent BNP was 17,611 on 12/16/2012. He is now stable on 20 mg of Lasix by mouth daily.  Left arm cellulitis Originally caused by IV infiltration. Doppler ultrasound was performed and was negative for DVT. The patient's erythema and swelling have improved significantly during this hospitalization. He was originally treated for this with Levaquin which did not seem to help. He was changed to IV vancomycin and now has improved. He will be discharged on Augmentin for 4 more days.  History of iron deficiency anemia, now with acute hematuria Patient has a history of chronic kidney disease. His hematuria is decreasing significantly.  His hemoglobin remained stable at approximately 9.  He is on iron supplementation.  Hypertension Blood pressure was soft immediately after his stroke, and blood pressure medications were held.  His blood pressure is beginning to climb and we have resumed amlodipine 10 mg daily.  Consultations:  Dr. Jerre Simon of urology  Discharge Exam: Filed Vitals:   12/18/12 2043 12/18/12 2112 12/19/12 0028 12/19/12 0518  BP:  168/54  162/64  Pulse:  90  88  Temp:  97.8 F (36.6 C)  97.3 F (36.3 C)  TempSrc:  Oral  Oral  Resp:  20  18  Height:      Weight:      SpO2: 99% 92% 95% 97%    General: Patient is alert and oriented, no apparent  distress, speech seems more clear today Cardiovascular: Regular rate and rhythm, no murmurs rubs or gallops, no lower extremity edema Respiratory: Minimal expiratory wheezes, no accessory muscle movement. Extremities: Edema and erythema significantly reduced in left upper extremity. Now appears normal. Neuro: Patient alert and oriented, still with mild dysarthria, very hard of hearing at baseline, improved movement in left upper extremity today, but still with left-sided weakness in his extremities  compared to his right side.   Discharge Instructions      Discharge Orders    Future Orders Please Complete By Expires   Diet - low sodium heart healthy      Comments:   D3, Nectar Thick, Aspiration Precautions, Low Sodium   Increase activity slowly          Medication List     As of 12/19/2012 11:09 AM    STOP taking these medications         cloNIDine 0.1 MG tablet   Commonly known as: CATAPRES      ferrous sulfate 325 (65 FE) MG tablet      levofloxacin 750 MG tablet   Commonly known as: LEVAQUIN      polyethylene glycol powder powder   Commonly known as: GLYCOLAX/MIRALAX      TAKE these medications         albuterol (5 MG/ML) 0.5% nebulizer solution   Commonly known as: PROVENTIL   Take 0.5 mLs (2.5 mg total) by nebulization every 6 (six) hours as needed for wheezing or shortness of breath.      amLODipine 10 MG tablet   Commonly known as: NORVASC   Take 1 tablet (10 mg total) by mouth daily.      amoxicillin-clavulanate 875-125 MG per tablet   Commonly known as: AUGMENTIN   Take 1 tablet by mouth 2 (two) times daily.      ARTHRITIS PAIN RELIEVER 650 MG CR tablet   Generic drug: acetaminophen   Take 650 mg by mouth daily as needed. For pain      aspirin EC 325 MG tablet   Take 1 tablet (325 mg total) by mouth daily.      DSS 100 MG Caps   Take 200 mg by mouth daily.      food thickener Powd   Commonly known as: THICK IT   Take 1 g by mouth as needed (thicken liquids).      furosemide 20 MG tablet   Commonly known as: LASIX   Take 1 tablet (20 mg total) by mouth daily.      iron polysaccharides 150 MG capsule   Commonly known as: NIFEREX   Take 1 capsule (150 mg total) by mouth daily.      latanoprost 0.005 % ophthalmic solution   Commonly known as: XALATAN   Place 1 drop into both eyes at bedtime.      levothyroxine 100 MCG tablet   Commonly known as: SYNTHROID, LEVOTHROID   Take 1 tablet (100 mcg total) by mouth every morning.       LORazepam 0.5 MG tablet   Commonly known as: ATIVAN   Take 0.5 tablets (0.25 mg total) by mouth every 6 (six) hours as needed for anxiety.      Muscle Rub 10-15 % Crea   Apply 1 application topically as needed (to left thigh).      phenylephrine 0.5 % nasal solution   Commonly known as: NEO-SYNEPHRINE   Place 1 drop into the nose every 4 (four) hours as needed.  Nasal congestion      simvastatin 20 MG tablet   Commonly known as: ZOCOR   Take 20 mg by mouth every morning.         Follow-up Information    Follow up with Alleen Borne I, MD. Schedule an appointment as soon as possible for a visit in 1 week. (make appointment for one week from discharge per Dr Jerre Simon request)    Contact information:   [address] [place] [address] [phone]        followup with nursing home provider within a week.             Follow up with Dietrich Pates., MD. Schedule an appointment as soon as possible for a visit in 2 weeks.   Contact information:   [address] [place] [address] [phone]     to evaluate for potential radiation therapy.        The results of significant diagnostics from this hospitalization (including imaging, microbiology, ancillary and laboratory) are listed below for reference.    Significant Diagnostic Studies: Ct Abdomen Pelvis Wo Contrast  12/04/2012  *RADIOLOGY REPORT*  Clinical Data: Left lower quadrant and left flank pain.  Low back pain.  CT ABDOMEN AND PELVIS WITHOUT CONTRAST  Technique:  Multidetector CT imaging of the abdomen and pelvis was performed following the standard protocol without intravenous contrast.  Comparison: 12/11/2004.  Findings: Lung Bases: Small bilateral pleural effusions.  Basilar atelectasis.  Coronary artery atherosclerosis is present.  Severe mitral annular calcification.  Liver:  Unenhanced CT was performed per clinician order.  Lack of IV contrast limits sensitivity and specificity, especially for evaluation of  abdominal/pelvic solid viscera.  Old granulomatous calcifications.  Spleen:  Old granulomatous disease.  Gallbladder:  Dependently layering calcified gallstones.  Common bile duct:  Within normal limits for age.  Pancreas:  Stable prominence of the pancreatic duct.  Scattered pancreatic calcifications, likely associated with prior pancreatitis.  Adrenal glands:  Stable thickening of the left adrenal gland, likely representing adenoma.  Kidneys:  Severe left hydronephrosis.  Left hydroureter is present. Vascular calcifications are present in the kidneys.  No collecting system calculi.  No ureteral calculi.  The left hydroureter extends to the urinary bladder where there is mural thickening on the left side of the trigone, suspicious for an obstructing neoplasm.  Right kidney and ureter appear within normal limits.  Stomach:  Grossly normal.  Decompressed.  Small bowel:  Normal.  Colon:   High density along the cecum compatible with prior appendectomy.  Colonic diverticulosis without diverticulitis. There is thickening of the rectum.  Follow-up colonoscopy recommended.  Pelvic Genitourinary:  High attenuation of the left side of the bladder trigone as described above.  No free fluid.  Prostate gland appears within normal limits.  No inguinal adenopathy.  No pelvic sidewall adenopathy.  Bones:  Lumbar spondylosis with anterolisthesis of L4 on L5.  Old T11 compression fracture.  Vasculature: Severe atherosclerosis without aneurysm.  IMPRESSION: 1.  Severe left hydroureteronephrosis extending to the left UVJ. There is soft tissue attenuation in the left side of the bladder trigone, raising the possibility of neoplasm producing obstruction of the distal left ureter. No ureteral calculi are seen. Cystoscopy recommended. 2.  Thickening of the rectum is nonspecific.  Follow-up endoscopy is recommended. 3.  Small bilateral pleural effusions and basilar atelectasis. 4.  Cholelithiasis without CT evidence of cholecystitis. 5.   Atherosclerosis and coronary artery disease.   Original Report Authenticated By: Andreas Newport, M.D.  Dg Chest 1 View  12/13/2012  *RADIOLOGY REPORT*  Clinical Data: Shortness of breath.  CHEST - 1 VIEW  Comparison: 12/07/2012  Findings: Normal heart size and pulmonary vascularity. Emphysematous changes in the lungs.  Infiltration or airspace disease in the lung bases, greater on the left.  This is stable since previous study.  Changes could represent atelectasis or pneumonia.  No blunting of costophrenic angles.  No pneumothorax. Calcified and tortuous aorta.  Degenerative changes in the shoulders.  IMPRESSION: Emphysematous changes with infiltration or atelectasis in both lung bases, similar to previous study.   Original Report Authenticated By: Burman Nieves, M.D.    Dg Chest 2 View  12/04/2012  *RADIOLOGY REPORT*  Clinical Data: Chronic cough  CHEST - 2 VIEW  Comparison: 05/13/2008  Findings: The heart and pulmonary vascularity are within normal limits.  Some atelectasis is seen in the left lung base. Hyperinflation consistent with COPD is noted.  No acute bony abnormality is seen.  IMPRESSION: Atelectasis versus scarring in the left lung base.   Original Report Authenticated By: Alcide Clever, M.D.    Dg Hip Complete Left  12/04/2012  *RADIOLOGY REPORT*  Clinical Data: Hip pain  LEFT HIP - COMPLETE 2+ VIEW  Comparison: None.  Findings: Four views of the left hip submitted.  No acute fracture or subluxation.  Mild degenerative changes noted bilateral hip joint with marginal acetabular spurring. Mild atherosclerotic calcifications of femoral arteries. Pelvic phleboliths are noted. Mild degenerative changes bilateral inferior aspect SI joints.  IMPRESSION: No acute fracture or subluxation.  Mild degenerative changes bilateral hip joints.   Original Report Authenticated By: Natasha Mead, M.D.    Mr Brain Wo Contrast  12/15/2012  *RADIOLOGY REPORT*  Clinical Data: Altered mental status.  History of prior  stroke.  MRI HEAD WITHOUT CONTRAST  Technique:  Multiplanar, multiecho pulse sequences of the brain and surrounding structures were obtained according to standard protocol without intravenous contrast.  Comparison: None.  Findings: Motion degraded exam.  Acute non hemorrhagic posterior right frontal lobe small infarct. This appears slightly rounded rather than wedge-shaped and if the patient had any progressive symptoms, follow-up imaging to confirm that this represents an infarct may be considered.  Prominent small vessel disease type changes.  Global atrophy.  Ventricular prominence felt to be related atrophy rather than hydrocephalus.  No intracranial hemorrhage.  No intracranial mass lesion detected on this unenhanced exam.  Major intracranial vascular structures are patent.  Prominent panus C1-2 level.  IMPRESSION:  Motion degraded exam.  Acute non hemorrhagic posterior right frontal lobe small infarct.  Prominent small vessel disease type changes.  This has been made a PRA call report utilizing dashboard call feature.   Original Report Authenticated By: Lacy Duverney, M.D.    US Carotid Duplex Bilateral  12/17/2012  *RADIOLOGY REPORT*  Clinical Data: Stroke., hypertension, previous tobacco use.  BILATERAL CAROTID DUPLEX ULTRASOUND  Technique: Wallace Cullens scale imaging, color Doppler and duplex ultrasound was performed of bilateral carotid and vertebral arteries in the neck.  Comparison: None available  Criteria:  Quantification of carotid stenosis is based on velocity parameters that correlate the residual internal carotid diameter with NASCET-based stenosis levels, using the diameter of the distal internal carotid lumen as the denominator for stenosis measurement.  The following velocity measurements were obtained:                   PEAK SYSTOLIC/END DIASTOLIC RIGHT ICA:  184/39cm/sec CCA:                        71/17cm/sec SYSTOLIC ICA/CCA RATIO:     2.57 DIASTOLIC ICA/CCA RATIO:    2.27  ECA:                        350cm/sec  LEFT ICA:                        130/21cm/sec CCA:                        100/10cm/sec SYSTOLIC ICA/CCA RATIO:     1.31 DIASTOLIC ICA/CCA RATIO:    2.13 ECA:                        216cm/sec  Findings:  RIGHT CAROTID ARTERY: Patchy nonocclusive plaque through the common carotid artery.  There is eccentric calcified plaque in the carotid bulb extending to the proximal ICA.  Markedly elevated peak systolic velocities in the proximal external carotid artery with focal aliasing on color Doppler interrogation.  There are moderately increased peak systolic velocities in the ICA just distal to the plaque.  Segments of the proximal ICA are obscured by distal acoustic shadowing from plaque calcification.  RIGHT VERTEBRAL ARTERY:  Normal flow direction and waveform.  LEFT CAROTID ARTERY: Eccentric partially calcified plaque in the mid and distal common carotid artery.  Circumferential irregular plaque in the carotid bulb and proximal ICA resulting at least mild stenosis.  There is moderate stenosis at the origin of the external carotid artery with elevated peak systolic velocities.  LEFT VERTEBRAL ARTERY:  Normal flow direction and waveform.  IMPRESSION:  1.  Bilateral carotid bifurcation and proximal ICA plaque, resulting in at least 50-69% diameter stenosis on the right, less than 50% diameter stenosis on the left. The exam does not exclude plaque ulceration or embolization.  Continued surveillance recommended. 2.  Bilateral external carotid artery origin stenoses, right worse than left.   Original Report Authenticated By: D. Andria Rhein, MD    US Venous Img Lower Unilateral Left  12/18/2012  *RADIOLOGY REPORT*  Clinical Data: Left leg pain, evaluate for DVT, former smoker  LEFT LOWER EXTREMITY VENOUS DUPLEX ULTRASOUND  Technique:  Gray-scale sonography with graded compression, as well as color Doppler and duplex ultrasound were performed to evaluate the deep venous system of the  lower extremity from the level of the common femoral vein through the popliteal and proximal calf veins. Spectral Doppler was utilized to evaluate flow at rest and with distal augmentation maneuvers.  Comparison:  None.  Findings:  Normal compressibility of the common femoral, superficial femoral, and popliteal veins is demonstrated, as well as the visualized proximal calf veins.  No filling defects to suggest DVT on grayscale or color Doppler imaging.  Doppler waveforms show normal direction of venous flow, normal respiratory phasicity and response to augmentation.  IMPRESSION: No evidence of deep vein thrombosis within the left lower extremity.   Original Report Authenticated By: Tacey Ruiz, MD    US Venous Img Upper Uni Left  12/11/2012  *RADIOLOGY REPORT*  Clinical Data:  Left upper extremity edema, evaluate for DVT  LEFT UPPER EXTREMITY VENOUS DUPLEX ULTRASOUND  Technique:  Gray-scale sonography with graded compression, as well as color Doppler and duplex ultrasound were performed to evaluate the upper extremity deep venous  system from the level of the subclavian vein and including the jugular, axillary, basilic and upper cephalic vein.  Spectral Doppler was utilized to evaluate flow at rest and with distal augmentation maneuvers.  Comparison:  None.  Findings:  Normal compressibility of the upper extremity deep veins is demonstrated.  No venous filling defects visualized on grayscale or color Doppler US.  Normal direction of flow is seen throughout the deep veins.  Spectral Doppler waveforms show normal morphology at rest and with distal augmentation.  Subcutaneous edema is noted within the forearm.  IMPRESSION: No evidence of deep venous thrombosis within the left upper extremity.   Original Report Authenticated By: Tacey Ruiz, MD    Dg Chest Port 1 View  12/17/2012  *RADIOLOGY REPORT*  Clinical Data: Wheezing, elevated white blood cell count  PORTABLE CHEST - 1 VIEW  Comparison: Portable chest  x-ray of 12/15/2012  Findings: The lungs are not as well aerated and there is little change to slight worsening of lower lobe opacities most consistent with pneumonia and effusions.  Mild cardiomegaly is stable.  No acute skeletal abnormality is seen.  There are degenerative changes noted in the shoulders.  IMPRESSION: Slight decrease in aeration with some worsening of bibasilar opacities most consistent with pneumonia and effusions.  An element of CHF cannot be excluded.   Original Report Authenticated By: Dwyane Dee, M.D.    Dg Chest Port 1 View  12/15/2012  *RADIOLOGY REPORT*  Clinical Data: Shortness of breath.  PORTABLE CHEST - 1 VIEW  Comparison: 12/13/2012.  Findings: Patient is rotated.  Heart size is grossly stable.  There is bibasilar dependent air space disease and bilateral pleural effusions.  Findings may be superimposed on chronic lung disease. Probable slight overall worsening from 12/14/2011.  IMPRESSION: Bibasilar predominant air space disease and bilateral pleural effusions, slightly worsened from 12/13/2012.   Original Report Authenticated By: Leanna Battles, M.D.    EKG:  Sinus rhythm with 1st degree A-V block Anterior infarct , age undetermined  Microbiology: Recent Results (from the past 240 hour(s))  MRSA PCR SCREENING     Status: Normal   Collection Time   12/15/12  4:10 PM      Component Value Range Status Comment   MRSA by PCR NEGATIVE  NEGATIVE Final   URINE CULTURE     Status: Normal   Collection Time   12/15/12  4:10 PM      Component Value Range Status Comment   Specimen Description URINE, CLEAN CATCH   Final    Special Requests NONE   Final    Culture  Setup Time 12/16/2012 03:24   Final    Colony Count NO GROWTH   Final    Culture NO GROWTH   Final    Report Status 12/17/2012 FINAL   Final      Labs: Basic Metabolic Panel:  Lab 12/19/12 1610 12/18/12 0355 12/17/12 0500 12/16/12 0416 12/15/12 0343  NA 143 140 139 138 140  K 3.9 4.1 4.1 4.8 4.5  CL 108 105  104 106 108  CO2 28 27 25 22 21   GLUCOSE 99 92 132* 177* 112*  BUN 45* 51* 53* 49* 49*  CREATININE 1.35 1.49* 1.77* 1.83* 1.90*  CALCIUM 8.3* 8.3* 8.4 8.4 8.6  MG -- -- -- -- --  PHOS -- -- -- -- --   Liver Function Tests:  Lab 12/16/12 0416 12/14/12 0650  AST 30 26  ALT 21 16  ALKPHOS 72 78  BILITOT 0.4 0.3  PROT 5.5* 5.7*  ALBUMIN 2.5* 2.4*    CBC:  Lab 12/19/12 0345 12/18/12 1550 12/18/12 0355 12/17/12 0500 12/16/12 0416 12/13/12 0541  WBC 15.5* 19.7* 17.2* 14.3* 7.5 --  NEUTROABS -- -- -- -- -- 8.0*  HGB 9.7* 11.2* 10.2* 9.3* 8.9* --  HCT 30.0* 33.8* 31.5* 28.3* 26.8* --  MCV 94.3 92.9 93.2 92.2 92.4 --  PLT 460* 517* 511* 462* 412* --   Cardiac Enzymes:  Lab 12/16/12 0415 12/15/12 2212 12/15/12 1613  CKTOTAL -- -- --  CKMB -- -- --  CKMBINDEX -- -- --  TROPONINI 0.41* 0.51* 0.55*   BNP: BNP (last 3 results)  Basename 12/16/12 0416 12/11/12 0404 12/08/12 0534  PROBNP 17611.0* 6791.0* 62952.8*    Signed:  Conley Canal [phone] [facility] 12/19/2012, 11:09 AM

## 2012-12-19 NOTE — Clinical Social Work Note (Signed)
Patient discharging today to [facility], spoke w Admissions at [facility] and they are agreeable to patient transferring this afternoon.  Discharge summary faxed to facility via [facility].  FL2 reviewed w RN and updated. Discharge packet prepared and placed w RN.  Family notified and agreeable to patient transfer to [facility].    Santa Genera, LCSW Clinical Social Worker [phone])

## 2012-12-22 ENCOUNTER — Ambulatory Visit (HOSPITAL_COMMUNITY)
Admit: 2012-12-22 | Discharge: 2012-12-22 | Disposition: A | Discharge status: Home / Self Care | Payer: Medicare Other | Source: Ambulatory Visit | Attending: Internal Medicine | Admitting: Internal Medicine

## 2012-12-22 DIAGNOSIS — M25559 Pain in unspecified hip: Secondary | ICD-10-CM | POA: Insufficient documentation

## 2013-01-06 ENCOUNTER — Ambulatory Visit (HOSPITAL_COMMUNITY): Payer: Medicare Other | Attending: Internal Medicine

## 2013-01-06 DIAGNOSIS — J3489 Other specified disorders of nose and nasal sinuses: Secondary | ICD-10-CM | POA: Insufficient documentation

## 2013-01-06 DIAGNOSIS — R059 Cough, unspecified: Secondary | ICD-10-CM | POA: Insufficient documentation

## 2013-01-06 DIAGNOSIS — R05 Cough: Secondary | ICD-10-CM | POA: Insufficient documentation

## 2013-01-06 DIAGNOSIS — I517 Cardiomegaly: Secondary | ICD-10-CM | POA: Insufficient documentation

## 2013-03-06 ENCOUNTER — Other Ambulatory Visit (HOSPITAL_BASED_OUTPATIENT_CLINIC_OR_DEPARTMENT_OTHER): Payer: Self-pay | Admitting: Internal Medicine

## 2013-03-15 ENCOUNTER — Emergency Department (HOSPITAL_COMMUNITY)
Admission: EM | Admit: 2013-03-15 | Discharge: 2013-03-16 | Disposition: A | Discharge status: Home / Self Care | Payer: Medicare Other | Attending: Emergency Medicine | Admitting: Emergency Medicine

## 2013-03-15 ENCOUNTER — Encounter (HOSPITAL_COMMUNITY): Payer: Self-pay | Admitting: *Deleted

## 2013-03-15 DIAGNOSIS — J3489 Other specified disorders of nose and nasal sinuses: Secondary | ICD-10-CM | POA: Insufficient documentation

## 2013-03-15 DIAGNOSIS — M129 Arthropathy, unspecified: Secondary | ICD-10-CM | POA: Insufficient documentation

## 2013-03-15 DIAGNOSIS — I10 Essential (primary) hypertension: Secondary | ICD-10-CM | POA: Insufficient documentation

## 2013-03-15 DIAGNOSIS — R0602 Shortness of breath: Secondary | ICD-10-CM | POA: Insufficient documentation

## 2013-03-15 DIAGNOSIS — Z8673 Personal history of transient ischemic attack (TIA), and cerebral infarction without residual deficits: Secondary | ICD-10-CM | POA: Insufficient documentation

## 2013-03-15 DIAGNOSIS — Z79899 Other long term (current) drug therapy: Secondary | ICD-10-CM | POA: Insufficient documentation

## 2013-03-15 DIAGNOSIS — R0981 Nasal congestion: Secondary | ICD-10-CM

## 2013-03-15 DIAGNOSIS — Z8559 Personal history of malignant neoplasm of other urinary tract organ: Secondary | ICD-10-CM | POA: Insufficient documentation

## 2013-03-15 DIAGNOSIS — Z8613 Personal history of malaria: Secondary | ICD-10-CM | POA: Insufficient documentation

## 2013-03-15 NOTE — ED Notes (Signed)
Pt arrived vis EMS from home with the complaint of SOB & nasal congestion.

## 2013-03-16 ENCOUNTER — Emergency Department (HOSPITAL_COMMUNITY): Payer: Medicare Other

## 2013-03-16 ENCOUNTER — Other Ambulatory Visit: Payer: Self-pay

## 2013-03-16 MED ORDER — IPRATROPIUM BROMIDE 0.02 % IN SOLN
0.5000 mg | Freq: Once | RESPIRATORY_TRACT | Status: AC
  Administered 2013-03-16: 0.5 mg via RESPIRATORY_TRACT
  Filled 2013-03-16: qty 2.5

## 2013-03-16 MED ORDER — ALBUTEROL SULFATE (5 MG/ML) 0.5% IN NEBU
2.5000 mg | INHALATION_SOLUTION | Freq: Once | RESPIRATORY_TRACT | Status: AC
  Administered 2013-03-16: 2.5 mg via RESPIRATORY_TRACT
  Filled 2013-03-16: qty 0.5

## 2013-03-16 MED ORDER — ALBUTEROL SULFATE HFA 108 (90 BASE) MCG/ACT IN AERS: 1.0000 | INHALATION_SPRAY | Freq: Four times a day (QID) | RESPIRATORY_TRACT | Status: DC | PRN

## 2013-03-16 MED ORDER — OXYMETAZOLINE HCL 0.05 % NA SOLN
1.0000 | Freq: Once | NASAL | Status: AC
  Administered 2013-03-16: 1 via NASAL
  Filled 2013-03-16: qty 15

## 2013-03-16 NOTE — ED Provider Notes (Signed)
History     CSN: [id number]  Arrival date & time 03/15/13  2347   First MD Initiated Contact with Patient 03/15/13 2355      Chief Complaint  Patient presents with  . Shortness of Breath  . Nasal Congestion    (Consider location/radiation/quality/duration/timing/severity/associated sxs/prior treatment) HPI Reginald Waller is a 79 y.o. male brought in by ambulance, who presents to the Emergency Department complaining of nasal congestion and shortness of breath when he went to bed tonight. He used his inhaler twice without improvement.  PCP Dr. Regino Schultze  Past Medical History  Diagnosis Date  . Hypertension   . Arthritis   . Stroke   . Malaria   . Urothelial cancer 12/16/2012    Past Surgical History  Procedure Laterality Date  . Hernia repair    . Cataract extraction    . Appendectomy    . Eye surgery    . Cystoscopy  12/08/2012    Procedure: CYSTOSCOPY FLEXIBLE;  Surgeon: Ky Barban, MD;  Location: [facility];  Service: Urology;  Laterality: N/A;  . Transurethral resection of bladder tumor  12/09/2012    Procedure: TRANSURETHRAL RESECTION OF BLADDER TUMOR (TURBT);  Surgeon: Ky Barban, MD;  Location: [facility];  Service: Urology;  Laterality: N/A;    Family History  Problem Relation Age of Onset  . Diabetes Mother   . Diabetes Father   . Cancer Sister     breast  . Cancer Sister     thyroid  . Cancer Brother     leukemia  . Cancer Brother   . Coronary artery disease Brother     History  Substance Use Topics  . Smoking status: Never Smoker   . Smokeless tobacco: Not on file  . Alcohol Use: No      Review of Systems  Constitutional: Negative for fever.       10 Systems reviewed and are negative for acute change except as noted in the HPI.  HENT: Negative for congestion.        Nasal congestion  Eyes: Negative for discharge and redness.  Respiratory: Positive for shortness of breath. Negative for cough.   Cardiovascular: Negative for chest pain.   Gastrointestinal: Negative for vomiting and abdominal pain.  Musculoskeletal: Negative for back pain.  Skin: Negative for rash.  Neurological: Negative for syncope, numbness and headaches.  Psychiatric/Behavioral:       No behavior change.    Allergies  Shellfish allergy  Home Medications   Current Outpatient Rx  Name  Route  Sig  Dispense  Refill  . acetaminophen (ARTHRITIS PAIN RELIEVER) 650 MG CR tablet   Oral   Take 650 mg by mouth daily as needed. For pain         . albuterol (PROVENTIL) (5 MG/ML) 0.5% nebulizer solution   Nebulization   Take 0.5 mLs (2.5 mg total) by nebulization every 6 (six) hours as needed for wheezing or shortness of breath.   20 mL   5   . amLODipine (NORVASC) 10 MG tablet   Oral   Take 1 tablet (10 mg total) by mouth daily.   30 tablet   0   . aspirin EC 325 MG tablet   Oral   Take 1 tablet (325 mg total) by mouth daily.   30 tablet   0   . docusate sodium 100 MG CAPS   Oral   Take 200 mg by mouth daily.   10 capsule      .  doxycycline (VIBRA-TABS) 100 MG tablet   Oral   Take 100 mg by mouth 2 (two) times daily.         . furosemide (LASIX) 20 MG tablet   Oral   Take 1 tablet (20 mg total) by mouth daily.   30 tablet   0   . iron polysaccharides (NIFEREX) 150 MG capsule   Oral   Take 1 capsule (150 mg total) by mouth daily.   30 capsule   1   . latanoprost (XALATAN) 0.005 % ophthalmic solution   Both Eyes   Place 1 drop into both eyes at bedtime.         Marland Kitchen levothyroxine (SYNTHROID, LEVOTHROID) 100 MCG tablet   Oral   Take 1 tablet (100 mcg total) by mouth every morning.         Marland Kitchen LORazepam (ATIVAN) 0.5 MG tablet   Oral   Take 0.5 tablets (0.25 mg total) by mouth every 6 (six) hours as needed for anxiety.   30 tablet   0   . Menthol-Methyl Salicylate (MUSCLE RUB) 10-15 % CREA   Topical   Apply 1 application topically as needed (to left thigh).   1 Tube   0   . phenylephrine (NEO-SYNEPHRINE) 0.5 %  nasal solution   Nasal   Place 1 drop into the nose every 4 (four) hours as needed. Nasal congestion         . simvastatin (ZOCOR) 20 MG tablet   Oral   Take 20 mg by mouth every morning.          Marland Kitchen amoxicillin-clavulanate (AUGMENTIN) 875-125 MG per tablet   Oral   Take 1 tablet by mouth 2 (two) times daily.   9 tablet   0   . food thickener (THICK IT) POWD   Oral   Take 1 g by mouth as needed (thicken liquids).           BP 193/62  Pulse 89  Temp(Src) 97.8 F (36.6 C) (Oral)  Resp 24  Ht 5' 10.5" (1.791 m)  Wt 150 lb (68.04 kg)  BMI 21.21 kg/m2  SpO2 100%  Physical Exam  Nursing note and vitals reviewed. Constitutional: He appears well-developed and well-nourished.  Awake, alert, nontoxic appearance.  HENT:  Head: Normocephalic and atraumatic.  Right Ear: External ear normal.  Left Ear: External ear normal.  Mouth/Throat: Oropharynx is clear and moist.  Neck: Neck supple.  Cardiovascular: Normal rate and intact distal pulses.   Pulmonary/Chest: Effort normal and breath sounds normal. He has no wheezes. He has no rales. He exhibits no tenderness.  Abdominal: Soft. There is no tenderness. There is no rebound.  Musculoskeletal: He exhibits no tenderness.  Baseline ROM, no obvious new focal weakness.  Neurological:  Mental status and motor strength appears baseline for patient and situation.  Skin: No rash noted.  Psychiatric: He has a normal mood and affect.    ED Course  Procedures (including critical care time)   Date: 03/16/2013  0035  Rate: 83  Rhythm: normal sinus rhythm and 1st degree HB  QRS Axis: left  Intervals: QT prolonged  ST/T Wave abnormalities: nonspecific ST/T changes  Conduction Disutrbances:incomplete left bundle branch block  Narrative Interpretation:   Old EKG Reviewed: unchanged c/w 12/16/2012   0117 Breathing better after afrin and albuterol/atrovent nebulizer. MDM  Patient with shortness of breath and nasal congestion. Given  afrin and albuterol/atrovent nebulizer with improvement. Pt stable in ED with no significant deterioration in condition.The  patient appears reasonably screened and/or stabilized for discharge and I doubt any other medical condition or other [facility] requiring further screening, evaluation, or treatment in the ED at this time prior to discharge.  MDM Reviewed: nursing note and vitals           Nicoletta Dress. Colon Branch, MD 03/16/13 1610

## 2013-03-16 NOTE — ED Notes (Signed)
Called martha, Pts POA, states she will be sending a ride shortly, Pt is aware

## 2013-03-16 NOTE — ED Notes (Signed)
resp therapy in room giving pt breathing treatment

## 2013-03-16 NOTE — ED Notes (Signed)
Respiratory paged for a breathing treatment at this time.  

## 2013-03-22 ENCOUNTER — Emergency Department (HOSPITAL_COMMUNITY): Payer: Medicare Other

## 2013-03-22 ENCOUNTER — Emergency Department (HOSPITAL_COMMUNITY)
Admission: EM | Admit: 2013-03-22 | Discharge: 2013-03-22 | Disposition: A | Discharge status: Home / Self Care | Payer: Medicare Other | Attending: Emergency Medicine | Admitting: Emergency Medicine

## 2013-03-22 ENCOUNTER — Encounter (HOSPITAL_COMMUNITY): Payer: Self-pay | Admitting: Emergency Medicine

## 2013-03-22 DIAGNOSIS — Z8673 Personal history of transient ischemic attack (TIA), and cerebral infarction without residual deficits: Secondary | ICD-10-CM | POA: Insufficient documentation

## 2013-03-22 DIAGNOSIS — Z79899 Other long term (current) drug therapy: Secondary | ICD-10-CM | POA: Insufficient documentation

## 2013-03-22 DIAGNOSIS — J00 Acute nasopharyngitis [common cold]: Secondary | ICD-10-CM | POA: Insufficient documentation

## 2013-03-22 DIAGNOSIS — Z8559 Personal history of malignant neoplasm of other urinary tract organ: Secondary | ICD-10-CM | POA: Insufficient documentation

## 2013-03-22 DIAGNOSIS — Z8739 Personal history of other diseases of the musculoskeletal system and connective tissue: Secondary | ICD-10-CM | POA: Insufficient documentation

## 2013-03-22 DIAGNOSIS — Z8613 Personal history of malaria: Secondary | ICD-10-CM | POA: Insufficient documentation

## 2013-03-22 DIAGNOSIS — J3489 Other specified disorders of nose and nasal sinuses: Secondary | ICD-10-CM | POA: Insufficient documentation

## 2013-03-22 DIAGNOSIS — I10 Essential (primary) hypertension: Secondary | ICD-10-CM | POA: Insufficient documentation

## 2013-03-22 DIAGNOSIS — R0602 Shortness of breath: Secondary | ICD-10-CM

## 2013-03-22 LAB — CBC WITH DIFFERENTIAL/PLATELET
Basophils Absolute: 0 10*3/uL (ref 0.0–0.1)
Lymphocytes Relative: 20 % (ref 12–46)
Neutro Abs: 7.6 10*3/uL (ref 1.7–7.7)
Neutrophils Relative %: 63 % (ref 43–77)
Platelets: 411 10*3/uL — ABNORMAL HIGH (ref 150–400)
RDW: 17.5 % — ABNORMAL HIGH (ref 11.5–15.5)
WBC: 12.1 10*3/uL — ABNORMAL HIGH (ref 4.0–10.5)

## 2013-03-22 LAB — BASIC METABOLIC PANEL
CO2: 24 mEq/L (ref 19–32)
Calcium: 9.3 mg/dL (ref 8.4–10.5)
Chloride: 99 mEq/L (ref 96–112)
Potassium: 4.1 mEq/L (ref 3.5–5.1)
Sodium: 136 mEq/L (ref 135–145)

## 2013-03-22 MED ORDER — ACETAMINOPHEN 325 MG PO TABS
650.0000 mg | ORAL_TABLET | Freq: Once | ORAL | Status: AC
  Administered 2013-03-22: 650 mg via ORAL
  Filled 2013-03-22: qty 2

## 2013-03-22 MED ORDER — ALBUTEROL SULFATE (5 MG/ML) 0.5% IN NEBU
2.5000 mg | INHALATION_SOLUTION | Freq: Once | RESPIRATORY_TRACT | Status: AC
  Administered 2013-03-22: 2.5 mg via RESPIRATORY_TRACT
  Filled 2013-03-22: qty 0.5

## 2013-03-22 MED ORDER — OXYMETAZOLINE HCL 0.05 % NA SOLN
1.0000 | Freq: Once | NASAL | Status: AC
  Administered 2013-03-22: 1 via NASAL
  Filled 2013-03-22: qty 15

## 2013-03-22 NOTE — ED Notes (Signed)
Patient signed for d/c instructions.  Home sitter with him;  Signature did not show on signature page

## 2013-03-22 NOTE — ED Provider Notes (Signed)
History     CSN: [id number]  Arrival date & time 03/22/13  0044   First MD Initiated Contact with Patient 03/22/13 [phone]      Chief Complaint  Patient presents with  . Shortness of Breath    (Consider location/radiation/quality/duration/timing/severity/associated sxs/prior treatment) HPI Reginald Waller is a 79 y.o. male with a h/o HTN, stroke, recent cold  brought in by ambulance, who presents to the Emergency Department complaining of shortness of breath that began tonight when he went to bed. He felt he couldn't catch his breath. He got up and was sitting in his recliner and still could not breath through his nose.  EMS was called and placed him of O2. He felt better. He was seen here for similar shortness of breath 03/15/13, given a nebulizer treatment, afrin and sent home. He had nasal congestion due to a cold.   PCP Dr. Regino Schultze Past Medical History  Diagnosis Date  . Hypertension   . Arthritis   . Stroke   . Malaria   . Urothelial cancer 12/16/2012    Past Surgical History  Procedure Laterality Date  . Hernia repair    . Cataract extraction    . Appendectomy    . Eye surgery    . Cystoscopy  12/08/2012    Procedure: CYSTOSCOPY FLEXIBLE;  Surgeon: Ky Barban, MD;  Location: [facility];  Service: Urology;  Laterality: N/A;  . Transurethral resection of bladder tumor  12/09/2012    Procedure: TRANSURETHRAL RESECTION OF BLADDER TUMOR (TURBT);  Surgeon: Ky Barban, MD;  Location: [facility];  Service: Urology;  Laterality: N/A;    Family History  Problem Relation Age of Onset  . Diabetes Mother   . Diabetes Father   . Cancer Sister     breast  . Cancer Sister     thyroid  . Cancer Brother     leukemia  . Cancer Brother   . Coronary artery disease Brother     History  Substance Use Topics  . Smoking status: Never Smoker   . Smokeless tobacco: Not on file  . Alcohol Use: No      Review of Systems  Constitutional: Negative for fever.       10 Systems  reviewed and are negative for acute change except as noted in the HPI.  HENT: Negative for congestion.        Nasal congestion  Eyes: Negative for discharge and redness.  Respiratory: Positive for shortness of breath. Negative for cough.   Cardiovascular: Negative for chest pain.  Gastrointestinal: Negative for vomiting and abdominal pain.  Musculoskeletal: Negative for back pain.  Skin: Negative for rash.  Neurological: Negative for syncope, numbness and headaches.  Psychiatric/Behavioral:       No behavior change.    Allergies  Shellfish allergy  Home Medications   Current Outpatient Rx  Name  Route  Sig  Dispense  Refill  . acetaminophen (ARTHRITIS PAIN RELIEVER) 650 MG CR tablet   Oral   Take 650 mg by mouth daily as needed. For pain         . albuterol (PROVENTIL HFA;VENTOLIN HFA) 108 (90 BASE) MCG/ACT inhaler   Inhalation   Inhale 1-2 puffs into the lungs every 6 (six) hours as needed for wheezing.   1 Inhaler   0   . albuterol (PROVENTIL) (5 MG/ML) 0.5% nebulizer solution   Nebulization   Take 0.5 mLs (2.5 mg total) by nebulization every 6 (six) hours as needed for wheezing  or shortness of breath.   20 mL   5   . amLODipine (NORVASC) 10 MG tablet   Oral   Take 1 tablet (10 mg total) by mouth daily.   30 tablet   0   . amoxicillin-clavulanate (AUGMENTIN) 875-125 MG per tablet   Oral   Take 1 tablet by mouth 2 (two) times daily.   9 tablet   0   . aspirin EC 325 MG tablet   Oral   Take 1 tablet (325 mg total) by mouth daily.   30 tablet   0   . docusate sodium 100 MG CAPS   Oral   Take 200 mg by mouth daily.   10 capsule      . doxycycline (VIBRA-TABS) 100 MG tablet   Oral   Take 100 mg by mouth 2 (two) times daily.         . food thickener (THICK IT) POWD   Oral   Take 1 g by mouth as needed (thicken liquids).         . furosemide (LASIX) 20 MG tablet   Oral   Take 1 tablet (20 mg total) by mouth daily.   30 tablet   0   . iron  polysaccharides (NIFEREX) 150 MG capsule   Oral   Take 1 capsule (150 mg total) by mouth daily.   30 capsule   1   . latanoprost (XALATAN) 0.005 % ophthalmic solution   Both Eyes   Place 1 drop into both eyes at bedtime.         Marland Kitchen levothyroxine (SYNTHROID, LEVOTHROID) 100 MCG tablet   Oral   Take 1 tablet (100 mcg total) by mouth every morning.         Marland Kitchen LORazepam (ATIVAN) 0.5 MG tablet   Oral   Take 0.5 tablets (0.25 mg total) by mouth every 6 (six) hours as needed for anxiety.   30 tablet   0   . Menthol-Methyl Salicylate (MUSCLE RUB) 10-15 % CREA   Topical   Apply 1 application topically as needed (to left thigh).   1 Tube   0   . phenylephrine (NEO-SYNEPHRINE) 0.5 % nasal solution   Nasal   Place 1 drop into the nose every 4 (four) hours as needed. Nasal congestion         . simvastatin (ZOCOR) 20 MG tablet   Oral   Take 20 mg by mouth every morning.            BP 159/66  Pulse 86  Temp(Src) 98.7 F (37.1 C) (Oral)  Resp 28  Ht 5\' 10"  (1.778 m)  Wt 150 lb (68.04 kg)  BMI 21.52 kg/m2  SpO2 99%  Physical Exam  Nursing note and vitals reviewed. Constitutional: He appears well-developed and well-nourished.  Awake, alert, nontoxic appearance.  HENT:  Head: Normocephalic and atraumatic.  Right Ear: External ear normal.  Left Ear: External ear normal.  Eyes: EOM are normal. Pupils are equal, round, and reactive to light.  Neck: Neck supple.  Cardiovascular: Normal rate and intact distal pulses.   Pulmonary/Chest: Effort normal and breath sounds normal. He exhibits no tenderness.  Abdominal: Soft. Bowel sounds are normal. There is no tenderness. There is no rebound.  Musculoskeletal: He exhibits no tenderness.  Baseline ROM, no obvious new focal weakness.  Neurological:  Mental status and motor strength appears baseline for patient and situation.  Skin: No rash noted.  Psychiatric: He has a normal mood and affect.  ED Course  Procedures  (including critical care time) Results for orders placed during the hospital encounter of 03/22/13  CBC WITH DIFFERENTIAL      Result Value Range   WBC 12.1 (*) 4.0 - 10.5 K/uL   RBC 3.54 (*) 4.22 - 5.81 MIL/uL   Hemoglobin 10.3 (*) 13.0 - 17.0 g/dL   HCT 16.1 (*) 09.6 - 04.5 %   MCV 87.6  78.0 - 100.0 fL   MCH 29.1  26.0 - 34.0 pg   MCHC 33.2  30.0 - 36.0 g/dL   RDW 40.9 (*) 81.1 - 91.4 %   Platelets 411 (*) 150 - 400 K/uL   Neutrophils Relative 63  43 - 77 %   Neutro Abs 7.6  1.7 - 7.7 K/uL   Lymphocytes Relative 20  12 - 46 %   Lymphs Abs 2.4  0.7 - 4.0 K/uL   Monocytes Relative 13 (*) 3 - 12 %   Monocytes Absolute 1.6 (*) 0.1 - 1.0 K/uL   Eosinophils Relative 4  0 - 5 %   Eosinophils Absolute 0.4  0.0 - 0.7 K/uL   Basophils Relative 0  0 - 1 %   Basophils Absolute 0.0  0.0 - 0.1 K/uL  BASIC METABOLIC PANEL      Result Value Range   Sodium 136  135 - 145 mEq/L   Potassium 4.1  3.5 - 5.1 mEq/L   Chloride 99  96 - 112 mEq/L   CO2 24  19 - 32 mEq/L   Glucose, Bld 122 (*) 70 - 99 mg/dL   BUN 34 (*) 6 - 23 mg/dL   Creatinine, Ser 7.82 (*) 0.50 - 1.35 mg/dL   Calcium 9.3  8.4 - 95.6 mg/dL   GFR calc non Af Amer 38 (*) >90 mL/min   GFR calc Af Amer 44 (*) >90 mL/min    Dg Chest Port 1 View  03/22/2013  *RADIOLOGY REPORT*  Clinical Data: Shortness of breath.  PORTABLE CHEST - 1 VIEW  Comparison: 03/16/2013  Findings: Mild cardiac enlargement.  Normal pulmonary vascularity. No focal consolidation in the lungs.  No blunting of costophrenic angles.  Scattered fibrosis and central peribronchial thickening suggesting chronic bronchitis.  No pneumothorax.  Calcification of the aorta.  Marked degenerative changes in the left shoulder with bone remodelling. Old right rib fractures.  No significant change since previous study.  IMPRESSION: Cardiac enlargement.  Chronic bronchitic changes.  No focal consolidation or acute change.   Original Report Authenticated By: Burman Nieves, M.D.          [phone] Breathing is better. No shortness of breath after nebulizer treatment and Afrin. 300 Leg cramping is better. MDM  Patient with shortness of breath. Given nebulizer treatment and afrin for nasal congestions. Feels better. Given tylenol for leg cramps. Labs are unremarkable. Xray is negative. Pt stable in ED with no significant deterioration in condition.The patient appears reasonably screened and/or stabilized for discharge and I doubt any other medical condition or other [facility] requiring further screening, evaluation, or treatment in the ED at this time prior to discharge.  MDM Reviewed: nursing note and vitals Interpretation: labs and x-ray           Nicoletta Dress. Colon Branch, MD 03/22/13 8657

## 2013-03-22 NOTE — ED Notes (Signed)
Patient presents to ER via EMS with c/o shortness of breath.  Patient was lying flat on EMS arrival; when patient was sat up and O2 applied, patient states he began to feel better.  Patient states was seen here recently for same and was given oxygen and that's what he wants tonight.  Per family to EMS, patient usually sleeps in a recliner.

## 2013-03-22 NOTE — ED Notes (Signed)
Continues to c/o cramping in right upper leg - MD notified.  Orders received

## 2013-03-22 NOTE — ED Notes (Signed)
frequently repositioning patient - constant c/o cramping in right thigh area.   Urinary leg bag emptied - 200 ml cloudy urine, warm blanket applied, multiple efforts to make patient more comfortable.

## 2013-03-22 NOTE — ED Notes (Addendum)
EMS contacted to transport patient back to his residence.  Oxygen discontinued.

## 2013-04-04 ENCOUNTER — Emergency Department (HOSPITAL_COMMUNITY): Payer: Medicare Other

## 2013-04-04 ENCOUNTER — Encounter (HOSPITAL_COMMUNITY): Payer: Self-pay | Admitting: Emergency Medicine

## 2013-04-04 ENCOUNTER — Inpatient Hospital Stay (HOSPITAL_COMMUNITY)
Admission: EM | Admit: 2013-04-04 | Discharge: 2013-04-06 | DRG: 292 | Disposition: A | Discharge status: Home Health | Payer: Medicare Other | Attending: Internal Medicine | Admitting: Internal Medicine

## 2013-04-04 DIAGNOSIS — Z8673 Personal history of transient ischemic attack (TIA), and cerebral infarction without residual deficits: Secondary | ICD-10-CM

## 2013-04-04 DIAGNOSIS — J9811 Atelectasis: Secondary | ICD-10-CM

## 2013-04-04 DIAGNOSIS — Z66 Do not resuscitate: Secondary | ICD-10-CM | POA: Diagnosis present

## 2013-04-04 DIAGNOSIS — I639 Cerebral infarction, unspecified: Secondary | ICD-10-CM

## 2013-04-04 DIAGNOSIS — N183 Chronic kidney disease, stage 3 unspecified: Secondary | ICD-10-CM

## 2013-04-04 DIAGNOSIS — D649 Anemia, unspecified: Secondary | ICD-10-CM

## 2013-04-04 DIAGNOSIS — Z8249 Family history of ischemic heart disease and other diseases of the circulatory system: Secondary | ICD-10-CM

## 2013-04-04 DIAGNOSIS — Z806 Family history of leukemia: Secondary | ICD-10-CM

## 2013-04-04 DIAGNOSIS — Z91013 Allergy to seafood: Secondary | ICD-10-CM

## 2013-04-04 DIAGNOSIS — Z833 Family history of diabetes mellitus: Secondary | ICD-10-CM

## 2013-04-04 DIAGNOSIS — E860 Dehydration: Secondary | ICD-10-CM

## 2013-04-04 DIAGNOSIS — N184 Chronic kidney disease, stage 4 (severe): Secondary | ICD-10-CM | POA: Diagnosis present

## 2013-04-04 DIAGNOSIS — Z808 Family history of malignant neoplasm of other organs or systems: Secondary | ICD-10-CM

## 2013-04-04 DIAGNOSIS — D62 Acute posthemorrhagic anemia: Secondary | ICD-10-CM

## 2013-04-04 DIAGNOSIS — C679 Malignant neoplasm of bladder, unspecified: Secondary | ICD-10-CM

## 2013-04-04 DIAGNOSIS — J90 Pleural effusion, not elsewhere classified: Secondary | ICD-10-CM

## 2013-04-04 DIAGNOSIS — M129 Arthropathy, unspecified: Secondary | ICD-10-CM | POA: Diagnosis present

## 2013-04-04 DIAGNOSIS — J40 Bronchitis, not specified as acute or chronic: Secondary | ICD-10-CM

## 2013-04-04 DIAGNOSIS — E875 Hyperkalemia: Secondary | ICD-10-CM

## 2013-04-04 DIAGNOSIS — I5031 Acute diastolic (congestive) heart failure: Secondary | ICD-10-CM

## 2013-04-04 DIAGNOSIS — J189 Pneumonia, unspecified organism: Secondary | ICD-10-CM

## 2013-04-04 DIAGNOSIS — N179 Acute kidney failure, unspecified: Secondary | ICD-10-CM

## 2013-04-04 DIAGNOSIS — Z23 Encounter for immunization: Secondary | ICD-10-CM

## 2013-04-04 DIAGNOSIS — I129 Hypertensive chronic kidney disease with stage 1 through stage 4 chronic kidney disease, or unspecified chronic kidney disease: Secondary | ICD-10-CM | POA: Diagnosis present

## 2013-04-04 DIAGNOSIS — N1330 Unspecified hydronephrosis: Secondary | ICD-10-CM

## 2013-04-04 DIAGNOSIS — Z79899 Other long term (current) drug therapy: Secondary | ICD-10-CM

## 2013-04-04 DIAGNOSIS — L03114 Cellulitis of left upper limb: Secondary | ICD-10-CM

## 2013-04-04 DIAGNOSIS — I509 Heart failure, unspecified: Secondary | ICD-10-CM | POA: Diagnosis present

## 2013-04-04 DIAGNOSIS — J9801 Acute bronchospasm: Secondary | ICD-10-CM

## 2013-04-04 DIAGNOSIS — N3289 Other specified disorders of bladder: Secondary | ICD-10-CM

## 2013-04-04 DIAGNOSIS — I5033 Acute on chronic diastolic (congestive) heart failure: Principal | ICD-10-CM | POA: Diagnosis present

## 2013-04-04 DIAGNOSIS — E785 Hyperlipidemia, unspecified: Secondary | ICD-10-CM | POA: Diagnosis present

## 2013-04-04 DIAGNOSIS — R0602 Shortness of breath: Secondary | ICD-10-CM

## 2013-04-04 DIAGNOSIS — I10 Essential (primary) hypertension: Secondary | ICD-10-CM | POA: Diagnosis present

## 2013-04-04 DIAGNOSIS — N390 Urinary tract infection, site not specified: Secondary | ICD-10-CM

## 2013-04-04 DIAGNOSIS — E039 Hypothyroidism, unspecified: Secondary | ICD-10-CM

## 2013-04-04 HISTORY — DX: Anemia, unspecified: D64.9

## 2013-04-04 HISTORY — DX: Hyperlipidemia, unspecified: E78.5

## 2013-04-04 LAB — CBC WITH DIFFERENTIAL/PLATELET
Eosinophils Absolute: 0.2 10*3/uL (ref 0.0–0.7)
Hemoglobin: 8.3 g/dL — ABNORMAL LOW (ref 13.0–17.0)
Lymphs Abs: 1.6 10*3/uL (ref 0.7–4.0)
MCH: 29.3 pg (ref 26.0–34.0)
Monocytes Relative: 12 % (ref 3–12)
Neutro Abs: 8.4 10*3/uL — ABNORMAL HIGH (ref 1.7–7.7)
Neutrophils Relative %: 73 % (ref 43–77)
RBC: 2.83 MIL/uL — ABNORMAL LOW (ref 4.22–5.81)

## 2013-04-04 LAB — URINE MICROSCOPIC-ADD ON

## 2013-04-04 LAB — URINALYSIS, ROUTINE W REFLEX MICROSCOPIC
Glucose, UA: NEGATIVE mg/dL
Specific Gravity, Urine: 1.02 (ref 1.005–1.030)
pH: 6 (ref 5.0–8.0)

## 2013-04-04 LAB — BASIC METABOLIC PANEL
GFR calc non Af Amer: 30 mL/min — ABNORMAL LOW (ref >90)
Glucose, Bld: 105 mg/dL — ABNORMAL HIGH (ref 70–99)
Potassium: 4.3 mEq/L (ref 3.5–5.1)
Sodium: 140 mEq/L (ref 135–145)

## 2013-04-04 MED ORDER — SODIUM CHLORIDE 0.9 % IJ SOLN
3.0000 mL | Freq: Two times a day (BID) | INTRAMUSCULAR | Status: DC
  Administered 2013-04-04 – 2013-04-06 (×4): 3 mL via INTRAVENOUS

## 2013-04-04 MED ORDER — LORAZEPAM 0.5 MG PO TABS
0.2500 mg | ORAL_TABLET | Freq: Four times a day (QID) | ORAL | Status: DC | PRN
  Administered 2013-04-04 – 2013-04-05 (×2): 0.25 mg via ORAL
  Filled 2013-04-04 (×2): qty 1

## 2013-04-04 MED ORDER — SODIUM CHLORIDE 0.9 % IJ SOLN: 3.0000 mL | INTRAMUSCULAR | Status: DC | PRN

## 2013-04-04 MED ORDER — PNEUMOCOCCAL VAC POLYVALENT 25 MCG/0.5ML IJ INJ: 0.5000 mL | INJECTION | INTRAMUSCULAR | Status: DC | PRN

## 2013-04-04 MED ORDER — SIMVASTATIN 20 MG PO TABS
20.0000 mg | ORAL_TABLET | Freq: Every morning | ORAL | Status: DC
  Administered 2013-04-05 – 2013-04-06 (×2): 20 mg via ORAL
  Filled 2013-04-04 (×2): qty 1

## 2013-04-04 MED ORDER — BIOTENE DRY MOUTH MT LIQD
15.0000 mL | Freq: Two times a day (BID) | OROMUCOSAL | Status: DC
  Administered 2013-04-05 – 2013-04-06 (×3): 15 mL via OROMUCOSAL

## 2013-04-04 MED ORDER — CARVEDILOL 3.125 MG PO TABS
3.1250 mg | ORAL_TABLET | Freq: Two times a day (BID) | ORAL | Status: DC
  Administered 2013-04-05 – 2013-04-06 (×3): 3.125 mg via ORAL
  Filled 2013-04-04 (×3): qty 1

## 2013-04-04 MED ORDER — TERAZOSIN HCL 1 MG PO CAPS
1.0000 mg | ORAL_CAPSULE | Freq: Every day | ORAL | Status: DC
Start: 2013-04-04 — End: 2013-04-06
  Administered 2013-04-04 – 2013-04-05 (×2): 1 mg via ORAL
  Filled 2013-04-04 (×2): qty 1

## 2013-04-04 MED ORDER — LATANOPROST 0.005 % OP SOLN
OPHTHALMIC | Status: AC
  Filled 2013-04-04: qty 2.5

## 2013-04-04 MED ORDER — ACETAMINOPHEN 325 MG PO TABS
650.0000 mg | ORAL_TABLET | ORAL | Status: DC | PRN
  Administered 2013-04-05: 650 mg via ORAL
  Filled 2013-04-04: qty 2

## 2013-04-04 MED ORDER — ASPIRIN EC 325 MG PO TBEC
325.0000 mg | DELAYED_RELEASE_TABLET | Freq: Every day | ORAL | Status: DC
  Administered 2013-04-04 – 2013-04-06 (×3): 325 mg via ORAL
  Filled 2013-04-04 (×3): qty 1

## 2013-04-04 MED ORDER — POLYSACCHARIDE IRON COMPLEX 150 MG PO CAPS
150.0000 mg | ORAL_CAPSULE | Freq: Every day | ORAL | Status: DC
Start: 2013-04-04 — End: 2013-04-06
  Administered 2013-04-04 – 2013-04-06 (×3): 150 mg via ORAL
  Filled 2013-04-04 (×3): qty 1

## 2013-04-04 MED ORDER — STARCH (THICKENING) PO POWD
1.0000 g | ORAL | Status: DC | PRN
  Filled 2013-04-04: qty 227

## 2013-04-04 MED ORDER — LATANOPROST 0.005 % OP SOLN
1.0000 [drp] | Freq: Every day | OPHTHALMIC | Status: DC
  Administered 2013-04-04 – 2013-04-05 (×2): 1 [drp] via OPHTHALMIC
  Filled 2013-04-04: qty 2.5

## 2013-04-04 MED ORDER — AMLODIPINE BESYLATE 5 MG PO TABS
10.0000 mg | ORAL_TABLET | Freq: Every day | ORAL | Status: DC
  Administered 2013-04-04 – 2013-04-06 (×3): 10 mg via ORAL
  Filled 2013-04-04 (×3): qty 2

## 2013-04-04 MED ORDER — LEVOTHYROXINE SODIUM 100 MCG PO TABS
100.0000 ug | ORAL_TABLET | Freq: Every day | ORAL | Status: DC
  Administered 2013-04-05 – 2013-04-06 (×2): 100 ug via ORAL
  Filled 2013-04-04 (×2): qty 1

## 2013-04-04 MED ORDER — ALBUTEROL SULFATE (5 MG/ML) 0.5% IN NEBU
5.0000 mg | INHALATION_SOLUTION | Freq: Once | RESPIRATORY_TRACT | Status: AC
  Administered 2013-04-04: 5 mg via RESPIRATORY_TRACT
  Filled 2013-04-04: qty 0.5

## 2013-04-04 MED ORDER — DOCUSATE SODIUM 100 MG PO CAPS
200.0000 mg | ORAL_CAPSULE | Freq: Every day | ORAL | Status: DC
Start: 2013-04-04 — End: 2013-04-06
  Administered 2013-04-04 – 2013-04-06 (×3): 200 mg via ORAL
  Filled 2013-04-04: qty 2
  Filled 2013-04-04: qty 1
  Filled 2013-04-04: qty 2
  Filled 2013-04-04: qty 1

## 2013-04-04 MED ORDER — SODIUM CHLORIDE 0.9 % IV SOLN: 250.0000 mL | INTRAVENOUS | Status: DC | PRN

## 2013-04-04 MED ORDER — ONDANSETRON HCL 4 MG/2ML IJ SOLN: 4.0000 mg | Freq: Four times a day (QID) | INTRAMUSCULAR | Status: DC | PRN

## 2013-04-04 MED ORDER — FUROSEMIDE 10 MG/ML IJ SOLN
40.0000 mg | Freq: Two times a day (BID) | INTRAMUSCULAR | Status: DC
  Administered 2013-04-04 – 2013-04-06 (×4): 40 mg via INTRAVENOUS
  Filled 2013-04-04 (×4): qty 4

## 2013-04-04 NOTE — ED Notes (Signed)
Patient given a water per RN approval.

## 2013-04-04 NOTE — H&P (Signed)
[facility] History and Physical  Reginald Waller YNW:[id number] DOB: September 28, 1919 DOA: 04/04/2013  Referring physician: Dr. Estell Harpin PCP: Kirk Ruths, MD  Specialists:   Chief Complaint: shortness of breath  HPI: Reginald Waller is a 79 y.o. male is history of chronic diastolic congestive heart failure resident of an assisted-living facility. Patient reports worsening shortness of breath that led to his current ED visit. Denies any chest pain, cough, fever. Patient is somewhat confused and history may be unreliable. No family is present. Denies any nausea vomiting diarrhea. Her workup in the emergency room indicates bilateral pleural effusion and signs of acute or chronic diastolic congestive heart failure. Patient has been referred for admission.  Review of Systems: pertinent positives as per HPI, otherwise negative  Past Medical History  Diagnosis Date  . Hypertension   . Arthritis   . Stroke   . Malaria   . Urothelial cancer 12/16/2012  . Anemia   . Hyperlipidemia    Past Surgical History  Procedure Laterality Date  . Hernia repair    . Cataract extraction    . Appendectomy    . Eye surgery    . Cystoscopy  12/08/2012    Procedure: CYSTOSCOPY FLEXIBLE;  Surgeon: Ky Barban, MD;  Location: [facility];  Service: Urology;  Laterality: N/A;  . Transurethral resection of bladder tumor  12/09/2012    Procedure: TRANSURETHRAL RESECTION OF BLADDER TUMOR (TURBT);  Surgeon: Ky Barban, MD;  Location: [facility];  Service: Urology;  Laterality: N/A;   Social History:  reports that he has quit smoking. He does not have any smokeless tobacco history on file. He reports that he does not drink alcohol or use illicit drugs.   Allergies  Allergen Reactions  . Shellfish Allergy Other (See Comments)    Swelling of throat and tongue.    Family History  Problem Relation Age of Onset  . Diabetes Mother   . Diabetes Father   . Cancer Sister     breast  . Cancer Sister    thyroid  . Cancer Brother     leukemia  . Cancer Brother   . Coronary artery disease Brother     Prior to Admission medications   Medication Sig Start Date End Date Taking? Authorizing Provider  acetaminophen (ARTHRITIS PAIN RELIEVER) 650 MG CR tablet Take 650 mg by mouth daily as needed. For pain   Yes Historical Provider, MD  albuterol (PROVENTIL HFA;VENTOLIN HFA) 108 (90 BASE) MCG/ACT inhaler Inhale 1-2 puffs into the lungs every 6 (six) hours as needed for wheezing. 03/16/13  Yes Nicoletta Dress. Colon Branch, MD  amLODipine (NORVASC) 10 MG tablet Take 1 tablet (10 mg total) by mouth daily. 12/11/12  Yes Tora Kindred York, PA-C  aspirin EC 325 MG tablet Take 1 tablet (325 mg total) by mouth daily. 12/19/12  Yes Marianne L York, PA-C  docusate sodium 100 MG CAPS Take 200 mg by mouth daily. 12/19/12  Yes Tora Kindred York, PA-C  food thickener (THICK IT) POWD Take 1 g by mouth as needed (thicken liquids). 12/19/12  Yes Marianne L York, PA-C  furosemide (LASIX) 20 MG tablet Take 1 tablet (20 mg total) by mouth daily. 12/19/12  Yes Marianne L York, PA-C  iron polysaccharides (NIFEREX) 150 MG capsule Take 1 capsule (150 mg total) by mouth daily. 12/19/12  Yes Marianne L York, PA-C  latanoprost (XALATAN) 0.005 % ophthalmic solution Place 1 drop into both eyes at bedtime.   Yes Historical Provider, MD  levothyroxine (SYNTHROID,  LEVOTHROID) 100 MCG tablet Take 100 mcg by mouth daily before breakfast.   Yes Historical Provider, MD  LORazepam (ATIVAN) 0.5 MG tablet Take 0.5 tablets (0.25 mg total) by mouth every 6 (six) hours as needed for anxiety. 12/19/12  Yes Marianne L York, PA-C  simvastatin (ZOCOR) 20 MG tablet Take 20 mg by mouth every morning.    Yes Historical Provider, MD  terazosin (HYTRIN) 1 MG capsule Take 1 mg by mouth at bedtime.   Yes Historical Provider, MD   Physical Exam: Filed Vitals:   04/04/13 1800 04/04/13 1900 04/04/13 1915 04/04/13 1917  BP: 140/63 150/55  149/50  Pulse: 86  80 83  Temp:    98  F (36.7 C)  TempSrc:    Oral  Resp: 26 15 19 24   Height:      Weight:      SpO2: 89%  95% 97%     General:  NAD  Eyes: PERRLA  ENT: mucous membranes are moist  Neck: supple  Cardiovascular: S1, S2, RRR  Respiratory: crackles at bases  Abdomen: soft, nt, nd,  Skin: no rashes  Musculoskeletal: 2+ edema  Psychiatric: anxious  Neurologic: grossly intact nonfocal  Labs on Admission:  Basic Metabolic Panel:  Recent Labs Lab 04/04/13 1409  NA 140  K 4.3  CL 103  CO2 24  GLUCOSE 105*  BUN 49*  CREATININE 1.88*  CALCIUM 9.0   Liver Function Tests: No results found for this basename: AST, ALT, ALKPHOS, BILITOT, PROT, ALBUMIN,  in the last 168 hours No results found for this basename: LIPASE, AMYLASE,  in the last 168 hours No results found for this basename: AMMONIA,  in the last 168 hours CBC:  Recent Labs Lab 04/04/13 1409  WBC 11.6*  NEUTROABS 8.4*  HGB 8.3*  HCT 25.0*  MCV 88.3  PLT 528*   Cardiac Enzymes: No results found for this basename: CKTOTAL, CKMB, CKMBINDEX, TROPONINI,  in the last 168 hours  BNP (last 3 results)  Recent Labs  12/08/12 0534 12/11/12 0404 12/16/12 0416  PROBNP 16208.0* 6791.0* 17611.0*   CBG: No results found for this basename: GLUCAP,  in the last 168 hours  Radiological Exams on Admission: Dg Chest 2 View  04/04/2013  *RADIOLOGY REPORT*  Clinical Data: Shortness of breath.  CHEST - 2 VIEW  Comparison: 03/22/2013  Findings: The cardiomediastinal silhouette is unremarkable. Small to moderate bilateral pleural effusions and bilateral lower lung opacities are present. There is no evidence of pleural effusion or pneumothorax. No acute bony abnormalities are present. No discrete pulmonary mass is present.  IMPRESSION: Small to moderate bilateral pleural effusions with bilateral lower lung atelectasis versus airspace disease.   Original Report Authenticated By: Harmon Pier, M.D.     Assessment/Plan Active Problems:    Hypertension   Hypothyroidism   SOB (shortness of breath)   Pleural effusion   Acute diastolic heart failure   Anemia   CKD (chronic kidney disease) stage 3, GFR 30-59 ml/min   1. Acute on chronic diastolic congestive heart failure initially admits to allergy. He'll receive IV Lasix we'll monitor strict I.'s and O.'s per heart failure admission protocol. He is a candidate for ACE inhibitors due to renal insufficiency. We'll start her on low dose Coreg twice a day. Continue aspirin. Check BNP 2. Shortness of breath secondary to #1. We'll wean down oxygen as tolerated. 3. Pleural effusion secondary to #1. We'll continue diuresis and follow serial chest x-rays. 4. Anemia, likely due to chronic disease and  patient's underlying bladder cancer. He does not have any evidence of bleeding. We'll continue to follow. 5. Chronic kidney disease stage III. Creatinine appears to be her baseline 6. Hypothyroidism. Continue levothyroxine   Code Status: Full code Family Communication: tried reaching patient's niece who is his power of attorney.  Unable to reach her over the phone Disposition Plan: return to [facility] on discharge  Time spent: 60 mins  [facility] Pager [phone]  If 7PM-7AM, please contact night-coverage [email or web address] Password [email or web address] 04/04/2013, 8:01 PM

## 2013-04-04 NOTE — ED Provider Notes (Signed)
History     CSN: [id number]  Arrival date & time 04/04/13  1328   First MD Initiated Contact with Patient 04/04/13 1353      Chief Complaint  Patient presents with  . Shortness of Breath    (Consider location/radiation/quality/duration/timing/severity/associated sxs/prior treatment) HPI  Past Medical History  Diagnosis Date  . Hypertension   . Arthritis   . Stroke   . Malaria   . Urothelial cancer 12/16/2012  . Anemia   . Hyperlipidemia     Past Surgical History  Procedure Laterality Date  . Hernia repair    . Cataract extraction    . Appendectomy    . Eye surgery    . Cystoscopy  12/08/2012    Procedure: CYSTOSCOPY FLEXIBLE;  Surgeon: Ky Barban, MD;  Location: [facility];  Service: Urology;  Laterality: N/A;  . Transurethral resection of bladder tumor  12/09/2012    Procedure: TRANSURETHRAL RESECTION OF BLADDER TUMOR (TURBT);  Surgeon: Ky Barban, MD;  Location: [facility];  Service: Urology;  Laterality: N/A;    Family History  Problem Relation Age of Onset  . Diabetes Mother   . Diabetes Father   . Cancer Sister     breast  . Cancer Sister     thyroid  . Cancer Brother     leukemia  . Cancer Brother   . Coronary artery disease Brother     History  Substance Use Topics  . Smoking status: Former Games developer  . Smokeless tobacco: Not on file  . Alcohol Use: No      Review of Systems  Allergies  Shellfish allergy  Home Medications   Current Outpatient Rx  Name  Route  Sig  Dispense  Refill  . acetaminophen (ARTHRITIS PAIN RELIEVER) 650 MG CR tablet   Oral   Take 650 mg by mouth daily as needed. For pain         . albuterol (PROVENTIL HFA;VENTOLIN HFA) 108 (90 BASE) MCG/ACT inhaler   Inhalation   Inhale 1-2 puffs into the lungs every 6 (six) hours as needed for wheezing.   1 Inhaler   0   . amLODipine (NORVASC) 10 MG tablet   Oral   Take 1 tablet (10 mg total) by mouth daily.   30 tablet   0   . aspirin EC 325 MG tablet   Oral  Take 1 tablet (325 mg total) by mouth daily.   30 tablet   0   . docusate sodium 100 MG CAPS   Oral   Take 200 mg by mouth daily.   10 capsule      . food thickener (THICK IT) POWD   Oral   Take 1 g by mouth as needed (thicken liquids).         . furosemide (LASIX) 20 MG tablet   Oral   Take 1 tablet (20 mg total) by mouth daily.   30 tablet   0   . iron polysaccharides (NIFEREX) 150 MG capsule   Oral   Take 1 capsule (150 mg total) by mouth daily.   30 capsule   1   . latanoprost (XALATAN) 0.005 % ophthalmic solution   Both Eyes   Place 1 drop into both eyes at bedtime.         Marland Kitchen levothyroxine (SYNTHROID, LEVOTHROID) 100 MCG tablet   Oral   Take 100 mcg by mouth daily before breakfast.         . LORazepam (ATIVAN) 0.5 MG  tablet   Oral   Take 0.5 tablets (0.25 mg total) by mouth every 6 (six) hours as needed for anxiety.   30 tablet   0   . simvastatin (ZOCOR) 20 MG tablet   Oral   Take 20 mg by mouth every morning.          . terazosin (HYTRIN) 1 MG capsule   Oral   Take 1 mg by mouth at bedtime.           BP 150/51  Pulse 89  Temp(Src) 97.3 F (36.3 C) (Oral)  Resp 19  Ht 5\' 8"  (1.727 m)  Wt 149 lb (67.586 kg)  BMI 22.66 kg/m2  SpO2 94%  Physical Exam  ED Course  Procedures (including critical care time)  Labs Reviewed  BASIC METABOLIC PANEL - Abnormal; Notable for the following:    Glucose, Bld 105 (*)    BUN 49 (*)    Creatinine, Ser 1.88 (*)    GFR calc non Af Amer 30 (*)    GFR calc Af Amer 34 (*)    All other components within normal limits  CBC WITH DIFFERENTIAL - Abnormal; Notable for the following:    WBC 11.6 (*)    RBC 2.83 (*)    Hemoglobin 8.3 (*)    HCT 25.0 (*)    RDW 17.4 (*)    Platelets 528 (*)    Neutro Abs 8.4 (*)    Monocytes Absolute 1.4 (*)    All other components within normal limits  PRO B NATRIURETIC PEPTIDE   Dg Chest 2 View  04/04/2013  *RADIOLOGY REPORT*  Clinical Data: Shortness of breath.   CHEST - 2 VIEW  Comparison: 03/22/2013  Findings: The cardiomediastinal silhouette is unremarkable. Small to moderate bilateral pleural effusions and bilateral lower lung opacities are present. There is no evidence of pleural effusion or pneumothorax. No acute bony abnormalities are present. No discrete pulmonary mass is present.  IMPRESSION: Small to moderate bilateral pleural effusions with bilateral lower lung atelectasis versus airspace disease.   Original Report Authenticated By: Harmon Pier, M.D.      1. Bronchitis   2. Bronchospasm       Date: 04/04/2013  Rate: 82  Rhythm: normal sinus rhythm  QRS Axis: normal  Intervals: normal  ST/T Wave abnormalities: ST depressions laterally  Conduction Disutrbances:none  Narrative Interpretation:   Old EKG Reviewed: unchanged   MDM          Benny Lennert, MD 04/04/13 1715

## 2013-04-04 NOTE — ED Notes (Signed)
Per EMS, pt reports SOB x 2-3 days. Pt has non-productive cough. En route pt O2 sat on room air 92. Pt placed on 1L and had O2 sat of 97. nad noted. Pt alert and oriented.

## 2013-04-04 NOTE — ED Notes (Signed)
Pt upper dentures placed in cup and labeled and remains at bedside.

## 2013-04-04 NOTE — ED Notes (Signed)
No bed request has been placed. EDP aware and reported "Hospitialist said he would come down and see the pt.". ED Secretary paged hospitialist. Awaiting Hospitialist to call back. Pt aware and verbalized understanding. Charge RN aware.

## 2013-04-05 DIAGNOSIS — E039 Hypothyroidism, unspecified: Secondary | ICD-10-CM

## 2013-04-05 LAB — BASIC METABOLIC PANEL
CO2: 25 mEq/L (ref 19–32)
Calcium: 9 mg/dL (ref 8.4–10.5)
Creatinine, Ser: 1.79 mg/dL — ABNORMAL HIGH (ref 0.50–1.35)
GFR calc non Af Amer: 31 mL/min — ABNORMAL LOW (ref >90)
Glucose, Bld: 85 mg/dL (ref 70–99)
Sodium: 141 mEq/L (ref 135–145)

## 2013-04-05 MED ORDER — ALBUTEROL SULFATE (5 MG/ML) 0.5% IN NEBU
2.5000 mg | INHALATION_SOLUTION | Freq: Four times a day (QID) | RESPIRATORY_TRACT | Status: DC | PRN
  Administered 2013-04-05: 2.5 mg via RESPIRATORY_TRACT
  Filled 2013-04-05: qty 0.5

## 2013-04-05 NOTE — Plan of Care (Signed)
Problem: Phase I Progression Outcomes Goal: Voiding-avoid urinary catheter unless indicated Outcome: Not Progressing Chronic foley     

## 2013-04-05 NOTE — Progress Notes (Signed)
[facility] PROGRESS NOTE  Reginald Waller:[id number] DOB: 25-May-1919 DOA: 04/04/2013 PCP: Kirk Ruths, MD  Assessment/Plan: 1. Acute on chronic diastolic congestive heart failure  Appears to be improving with IV lasix. With continue with diuresis.   2. Shortness of breath secondary to #1. We'll wean down oxygen as tolerated. Improving 3. Pleural effusion secondary to #1. We'll continue diuresis and follow serial chest x-rays. 4. Anemia, likely due to chronic disease and patient's underlying bladder cancer. He does not have any evidence of bleeding. We'll continue to follow. 5. Chronic kidney disease stage III. Creatinine appears to be at baseline 6. Hypothyroidism. Continue levothyroxine. 7. Abnormal urinalysis.  Patient has wbc in urine.  He has a chronic foley.  No clinical signs of UTI.  Will hold off on antibiotics for now.   Code Status: DNR Family Communication: no family at bedside. Discussed with niece Laverle Hobby who is his power of attorney.  Disposition Plan: return to [facility] on discharge,  Possibly within the next 24-48 hours, will get PT eval.   Consultants:  none  Procedures:  none  Antibiotics:  none  HPI/Subjective: Feeling better today, breathing improving  Objective: Filed Vitals:   04/04/13 1915 04/04/13 1917 04/04/13 2055 04/05/13 0555  BP:  149/50 155/51 134/52  Pulse: 80 83 81 82  Temp:  98 F (36.7 C) 97.9 F (36.6 C) 97.8 F (36.6 C)  TempSrc:  Oral Oral Oral  Resp: 19 24 20 20   Height:   5\' 8"  (1.727 m)   Weight:   68.9 kg (151 lb 14.4 oz)   SpO2: 95% 97% 95% 96%    Intake/Output Summary (Last 24 hours) at 04/05/13 1036 Last data filed at 04/05/13 0500  Gross per 24 hour  Intake    240 ml  Output   2325 ml  Net  -2085 ml   Filed Weights   04/04/13 1331 04/04/13 2055  Weight: 67.586 kg (149 lb) 68.9 kg (151 lb 14.4 oz)    Exam:   General:  NAD  Cardiovascular: S1, s2 RRR  Respiratory: crackles at  bases  Abdomen: soft, nt, nd, bs+  Musculoskeletal: 1+ edema b/l   Data Reviewed: Basic Metabolic Panel:  Recent Labs Lab 04/04/13 1409 04/05/13 0555  NA 140 141  K 4.3 3.8  CL 103 103  CO2 24 25  GLUCOSE 105* 85  BUN 49* 45*  CREATININE 1.88* 1.79*  CALCIUM 9.0 9.0   Liver Function Tests: No results found for this basename: AST, ALT, ALKPHOS, BILITOT, PROT, ALBUMIN,  in the last 168 hours No results found for this basename: LIPASE, AMYLASE,  in the last 168 hours No results found for this basename: AMMONIA,  in the last 168 hours CBC:  Recent Labs Lab 04/04/13 1409  WBC 11.6*  NEUTROABS 8.4*  HGB 8.3*  HCT 25.0*  MCV 88.3  PLT 528*   Cardiac Enzymes: No results found for this basename: CKTOTAL, CKMB, CKMBINDEX, TROPONINI,  in the last 168 hours BNP (last 3 results)  Recent Labs  12/11/12 0404 12/16/12 0416 04/04/13 1409  PROBNP 6791.0* 17611.0* 13186.0*   CBG: No results found for this basename: GLUCAP,  in the last 168 hours  Recent Results (from the past 240 hour(s))  MRSA PCR SCREENING     Status: None   Collection Time    04/05/13  4:34 AM      Result Value Range Status   MRSA by PCR NEGATIVE  NEGATIVE Final   Comment:  The GeneXpert MRSA Assay (FDA     approved for NASAL specimens     only), is one component of a     comprehensive MRSA colonization     surveillance program. It is not     intended to diagnose MRSA     infection nor to guide or     monitor treatment for     MRSA infections.     Studies: Dg Chest 2 View  04/04/2013  *RADIOLOGY REPORT*  Clinical Data: Shortness of breath.  CHEST - 2 VIEW  Comparison: 03/22/2013  Findings: The cardiomediastinal silhouette is unremarkable. Small to moderate bilateral pleural effusions and bilateral lower lung opacities are present. There is no evidence of pleural effusion or pneumothorax. No acute bony abnormalities are present. No discrete pulmonary mass is present.  IMPRESSION: Small  to moderate bilateral pleural effusions with bilateral lower lung atelectasis versus airspace disease.   Original Report Authenticated By: Harmon Pier, M.D.     Scheduled Meds: . amLODipine  10 mg Oral Daily  . antiseptic oral rinse  15 mL Mouth Rinse BID  . aspirin EC  325 mg Oral Daily  . carvedilol  3.125 mg Oral BID WC  . docusate sodium  200 mg Oral Daily  . furosemide  40 mg Intravenous Q12H  . iron polysaccharides  150 mg Oral Daily  . latanoprost  1 drop Both Eyes QHS  . levothyroxine  100 mcg Oral QAC breakfast  . simvastatin  20 mg Oral q morning - 10a  . sodium chloride  3 mL Intravenous Q12H  . terazosin  1 mg Oral QHS   Continuous Infusions:   Active Problems:   Hypertension   Hypothyroidism   SOB (shortness of breath)   Pleural effusion   Acute diastolic heart failure   Anemia   CKD (chronic kidney disease) stage 3, GFR 30-59 ml/min    Time spent:    Mieke Brinley  [facility] Pager [phone]. If 7PM-7AM, please contact night-coverage at [email or web address], password [email or web address] 04/05/2013, 10:36 AM  LOS: 1 day

## 2013-04-06 DIAGNOSIS — N179 Acute kidney failure, unspecified: Secondary | ICD-10-CM

## 2013-04-06 DIAGNOSIS — J40 Bronchitis, not specified as acute or chronic: Secondary | ICD-10-CM

## 2013-04-06 LAB — CBC
MCH: 28.9 pg (ref 26.0–34.0)
MCHC: 32.8 g/dL (ref 30.0–36.0)
Platelets: 537 10*3/uL — ABNORMAL HIGH (ref 150–400)
RDW: 17.5 % — ABNORMAL HIGH (ref 11.5–15.5)

## 2013-04-06 LAB — BASIC METABOLIC PANEL
BUN: 48 mg/dL — ABNORMAL HIGH (ref 6–23)
Calcium: 8.7 mg/dL (ref 8.4–10.5)
GFR calc non Af Amer: 26 mL/min — ABNORMAL LOW (ref >90)
Glucose, Bld: 93 mg/dL (ref 70–99)

## 2013-04-06 LAB — URINE CULTURE

## 2013-04-06 MED ORDER — CARVEDILOL 3.125 MG PO TABS: 3.1250 mg | ORAL_TABLET | Freq: Two times a day (BID) | ORAL | Status: AC

## 2013-04-06 MED ORDER — FUROSEMIDE 20 MG PO TABS: 40.0000 mg | ORAL_TABLET | Freq: Every day | ORAL | Status: DC

## 2013-04-06 NOTE — Clinical Social Work Note (Signed)
Patient ready for discharge and return to [facility].  Facility notified and willing to take patient at discharge, discharge summary faxed via [facility].  Discharge packet prepared and placed w shadow chart for transport.  Niece notified by phone and agreeable.  [facility] will arrive approx 2 PM to transport patient, will bring clothes for patient to change into.  FL 2 reviewed w RN and updated as needed.  CSW signing off as no further SW needs identified.  Santa Genera, LCSW Clinical Social Worker [phone])

## 2013-04-06 NOTE — Clinical Social Work Psychosocial (Signed)
    Clinical Social Work Department BRIEF PSYCHOSOCIAL ASSESSMENT 04/06/2013  Patient:  Reginald Waller, Reginald Waller     Account Number:  [id number]     Admit date:  04/04/2013  Clinical Social Worker:  Santa Genera, CLINICAL SOCIAL WORKER  Date/Time:  04/06/2013 12:00 N  Referred by:  RN  Date Referred:  04/06/2013 Referred for  ALF Placement   Other Referral:   Interview type:  Patient Other interview type:   Also spoke w niece Burna Mortimer    PSYCHOSOCIAL DATA Living Status:  FACILITY Admitted from facility:  [facility] Level of care:  Assisted Living Primary support name:  Mamie Nick Primary support relationship to patient:  SPOUSE Degree of support available:   Limited due to physical and medical issues    CURRENT CONCERNS Current Concerns  Post-Acute Placement   Other Concerns:    SOCIAL WORK ASSESSMENT / PLAN CSW met w patient at bedside, patient alert and oriented x4.  Patient is short term resident of [address], at facility for 30 days on a trial.  Lives in same room as wife who is also currently an inpatient at [facility].  Patient says he has also been at [facility] in recent past, says "Ill try to go back there" but seems uncertain of his physcial health and abliity to be independent. Patient asked CSW to speak w niece, Burna Mortimer.  Burna Mortimer confirmed that patient and wife are short term residents at [facility], family hopes they will decide to remain there. Prior to this have been living at home w caregivers coming into the home.  Family is not sure this is sustainable and prefer that patient and wife remain at [facility].    Patient says that he and his wife are able to help each other out -what I cant do, she can do for me."  Seems to depend on spouse for help w some ADLs.    [facility] confirms that they are willing to take patient back at discharge, will provide transport to facility via their van.  CSW signing off at this time as discharge process  is complete and all SW needs have been addressed.   Assessment/plan status:  Psychosocial Support/Ongoing Assessment of Needs Other assessment/ plan:   Information/referral to community resources:   None needed at this time    PATIENT'S/FAMILY'S RESPONSE TO PLAN OF CARE: Patient and family willing to work w CSW for return to [facility], [place] Clinical Social Worker [phone])

## 2013-04-06 NOTE — Discharge Summary (Signed)
[facility]                                                                                   Reginald Waller, is a 79 y.o. male  DOB 1919-07-16  MRN [id number].  Admission date:  04/04/2013  Discharge Date:  04/06/2013  Primary MD  Kirk Ruths, MD  Admitting Physician  Erick Blinks, MD  Admission Diagnosis  Bronchospasm [519.11] Bronchitis [490]  Discharge Diagnosis     Active Problems:   Hypertension   Hypothyroidism   SOB (shortness of breath)   Pleural effusion   Acute diastolic heart failure   Anemia   CKD (chronic kidney disease) stage 3, GFR 30-59 ml/min    Past Medical History  Diagnosis Date  . Hypertension   . Arthritis   . Stroke   . Malaria   . Urothelial cancer 12/16/2012  . Anemia   . Hyperlipidemia     Past Surgical History  Procedure Laterality Date  . Hernia repair    . Cataract extraction    . Appendectomy    . Eye surgery    . Cystoscopy  12/08/2012    Procedure: CYSTOSCOPY FLEXIBLE;  Surgeon: Ky Barban, MD;  Location: [facility];  Service: Urology;  Laterality: N/A;  . Transurethral resection of bladder tumor  12/09/2012    Procedure: TRANSURETHRAL RESECTION OF BLADDER TUMOR (TURBT);  Surgeon: Ky Barban, MD;  Location: [facility];  Service: Urology;  Laterality: N/A;     Recommendations for primary care physician for things to follow:   Please follow BMP, CBC, weight and Lasix dose closely   Discharge Diagnoses:   Active Problems:   Hypertension   Hypothyroidism   SOB (shortness of breath)   Pleural effusion   Acute diastolic heart failure   Anemia   CKD (chronic kidney disease) stage 3, GFR 30-59 ml/min    Discharge Condition: Stable   Diet recommendation: See Discharge Instructions below   Consults None    History of present illness and  Hospital Course:     Kindly see H&P for history of present illness and admission details, please review complete Labs, Consult reports and Test reports for  all details in brief Reginald Waller, is a 79 y.o. male, patient with history of HTN, Hypothyroidism, Chr diastolic CHF, CKD-4, was admitted to the Hospital with Acute on Chronic Distolic CHF and mild ARF on CKD-4, was treated with IV lasix and fluid restriction, now at baseline, no chest pain/pressure or SOB, no o2 need, creatinine midlly elevated than baseline, reduce Lasix dose, outpt recheck of BMP by PCP in 3 days. Note recent echo shows Ef 60%, mild LVH.   He has mild CHF related Pl effusion will request PCP to repeat 2 View CXR in 2 weeks.   Mild ARF on CKD 4 - reduced Lasix dose (present in house dose) - repeat BMP in 3-4 days, monitor BMP, CBC, weight and Lasix dose closely.   Chr Foley indwelling - outpt urology follow up post DC.     Today   Subjective:   Reginald Waller today has no headache,no chest abdominal pain,no new weakness tingling or numbness, feels much  better wants to go home today.    Objective:   Blood pressure 142/56, pulse 76, temperature 97.8 F (36.6 C), temperature source Oral, resp. rate 16, height 5\' 8"  (1.727 m), weight 68.9 kg (151 lb 14.4 oz), SpO2 96.00%.   Intake/Output Summary (Last 24 hours) at 04/06/13 1107 Last data filed at 04/06/13 0802  Gross per 24 hour  Intake    480 ml  Output   3300 ml  Net  -2820 ml    Exam Awake Alert, Oriented *3, No new F.N deficits, Normal affect [place].AT,PERRAL Supple Neck,No JVD, No cervical lymphadenopathy appriciated.  Symmetrical Chest wall movement, Good air movement bilaterally, CTAB RRR,No Gallops,Rubs or new Murmurs, No Parasternal Heave +ve B.Sounds, Abd Soft, Non tender, No organomegaly appriciated, No rebound -guarding or rigidity. No Cyanosis, Clubbing or edema, No new Rash or bruise  Data Review   Major procedures and Radiology Reports - PLEASE review detailed and final reports for all details in brief -       Dg Chest 2 View  04/04/2013  *RADIOLOGY REPORT*  Clinical Data: Shortness of  breath.  CHEST - 2 VIEW  Comparison: 03/22/2013  Findings: The cardiomediastinal silhouette is unremarkable. Small to moderate bilateral pleural effusions and bilateral lower lung opacities are present. There is no evidence of pleural effusion or pneumothorax. No acute bony abnormalities are present. No discrete pulmonary mass is present.  IMPRESSION: Small to moderate bilateral pleural effusions with bilateral lower lung atelectasis versus airspace disease.   Original Report Authenticated By: Harmon Pier, M.D.    Dg Chest Port 1 View  03/22/2013  *RADIOLOGY REPORT*  Clinical Data: Shortness of breath.  PORTABLE CHEST - 1 VIEW  Comparison: 03/16/2013  Findings: Mild cardiac enlargement.  Normal pulmonary vascularity. No focal consolidation in the lungs.  No blunting of costophrenic angles.  Scattered fibrosis and central peribronchial thickening suggesting chronic bronchitis.  No pneumothorax.  Calcification of the aorta.  Marked degenerative changes in the left shoulder with bone remodelling. Old right rib fractures.  No significant change since previous study.  IMPRESSION: Cardiac enlargement.  Chronic bronchitic changes.  No focal consolidation or acute change.   Original Report Authenticated By: Burman Nieves, M.D.    Dg Chest Portable 1 View  03/16/2013  *RADIOLOGY REPORT*  Clinical Data: Shortness of breath and nasal congestion. Hypertension.  PORTABLE CHEST - 1 VIEW  Comparison: 01/06/2013  Findings: Mild hyperinflation. Midline trachea.  Borderline cardiomegaly, with atherosclerosis in the transverse aorta. No pleural effusion or pneumothorax.  Mild lower lobe predominant interstitial thickening is chronic. No lobar consolidation.  IMPRESSION: Borderline cardiomegaly, without acute disease.   Original Report Authenticated By: Jeronimo Greaves, M.D.     Micro Results      Recent Results (from the past 240 hour(s))  MRSA PCR SCREENING     Status: None   Collection Time    04/05/13  4:34 AM       Result Value Range Status   MRSA by PCR NEGATIVE  NEGATIVE Final   Comment:            The GeneXpert MRSA Assay (FDA     approved for NASAL specimens     only), is one component of a     comprehensive MRSA colonization     surveillance program. It is not     intended to diagnose MRSA     infection nor to guide or     monitor treatment for     MRSA infections.  CBC w Diff: Lab Results  Component Value Date   WBC 11.9* 04/06/2013   HGB 8.5* 04/06/2013   HCT 25.9* 04/06/2013   PLT 537* 04/06/2013   LYMPHOPCT 14 04/04/2013   MONOPCT 12 04/04/2013   EOSPCT 1 04/04/2013   BASOPCT 0 04/04/2013    CMP: Lab Results  Component Value Date   NA 140 04/06/2013   K 4.0 04/06/2013   CL 101 04/06/2013   CO2 27 04/06/2013   BUN 48* 04/06/2013   CREATININE 2.09* 04/06/2013   PROT 5.5* 12/16/2012   ALBUMIN 2.5* 12/16/2012   BILITOT 0.4 12/16/2012   ALKPHOS 72 12/16/2012   AST 30 12/16/2012   ALT 21 12/16/2012  .   Discharge Instructions      Follow with Primary MD Kirk Ruths, MD in 3 days   Get CBC, CMP, checked 3 days by Primary MD and again as instructed by your Primary MD. Get a 2 view Chest X ray done next visit if you had Pneumonia of Lung problems at the Hospital.  Get Medicines reviewed and adjusted.  Please request your Prim.MD to go over all Hospital Tests and Procedure/Radiological results at the follow up, please get all Hospital records sent to your Prim MD by signing hospital release before you go home.  Activity: As tolerated with Full fall precautions use walker/cane & assistance as needed   Diet:  Heart Healthy,  Fluid restriction 1.8 lit/day, Aspiration precautions.  Check your Weight same time everyday, if you gain over 2 pounds, or you develop in leg swelling, experience more shortness of breath or chest pain, call your Primary MD immediately. Follow Cardiac Low Salt Diet and 1.8 lit/day fluid restriction.  Disposition Home   If you experience worsening of your  admission symptoms, develop shortness of breath, life threatening emergency, suicidal or homicidal thoughts you must seek medical attention immediately by calling 911 or calling your MD immediately  if symptoms less severe.  You Must read complete instructions/literature along with all the possible adverse reactions/side effects for all the Medicines you take and that have been prescribed to you. Take any new Medicines after you have completely understood and accpet all the possible adverse reactions/side effects.   Do not drive and provide baby sitting services if your were admitted for syncope or siezures until you have seen by Primary MD or a Neurologist and advised to do so again.  Do not drive when taking Pain medications.    Do not take more than prescribed Pain, Sleep and Anxiety Medications  Special Instructions: If you have smoked or chewed Tobacco  in the last 2 yrs please stop smoking, stop any regular Alcohol  and or any Recreational drug use.  Wear Seat belts while driving.   Follow-up Information   Follow up with Kirk Ruths, MD. Schedule an appointment as soon as possible for a visit in 3 days.   Contact information:   [address] [place] [address] [phone]         Discharge Medications     Medication List    TAKE these medications       albuterol 108 (90 BASE) MCG/ACT inhaler  Commonly known as:  PROVENTIL HFA;VENTOLIN HFA  Inhale 1-2 puffs into the lungs every 6 (six) hours as needed for wheezing.     amLODipine 10 MG tablet  Commonly known as:  NORVASC  Take 1 tablet (10 mg total) by mouth daily.     ARTHRITIS PAIN  RELIEVER 650 MG CR tablet  Generic drug:  acetaminophen  Take 650 mg by mouth daily as needed. For pain     aspirin EC 325 MG tablet  Take 1 tablet (325 mg total) by mouth daily.     carvedilol 3.125 MG tablet  Commonly known as:  COREG  Take 1 tablet (3.125 mg total) by mouth 2 (two) times daily  with a meal.     DSS 100 MG Caps  Take 200 mg by mouth daily.     food thickener Powd  Commonly known as:  THICK IT  Take 1 g by mouth as needed (thicken liquids).     furosemide 20 MG tablet  Commonly known as:  LASIX  Take 2 tablets (40 mg total) by mouth daily.     iron polysaccharides 150 MG capsule  Commonly known as:  NIFEREX  Take 1 capsule (150 mg total) by mouth daily.     latanoprost 0.005 % ophthalmic solution  Commonly known as:  XALATAN  Place 1 drop into both eyes at bedtime.     levothyroxine 100 MCG tablet  Commonly known as:  SYNTHROID, LEVOTHROID  Take 100 mcg by mouth daily before breakfast.     LORazepam 0.5 MG tablet  Commonly known as:  ATIVAN  Take 0.5 tablets (0.25 mg total) by mouth every 6 (six) hours as needed for anxiety.     simvastatin 20 MG tablet  Commonly known as:  ZOCOR  Take 20 mg by mouth every morning.     terazosin 1 MG capsule  Commonly known as:  HYTRIN  Take 1 mg by mouth at bedtime.           Total Time in preparing paper work, data evaluation and todays exam - 35 minutes  Leroy Sea M.D on 04/06/2013 at 11:07 AM  [facility] Office  [phone]

## 2013-04-06 NOTE — Progress Notes (Signed)
Spoke with pts niece, Burna Mortimer, regarding pts discharge. Reviewed discharge summary with niece. All questions answered. F/u appointment made.

## 2013-04-06 NOTE — Progress Notes (Signed)
Patient transferred to [facility] via their transportation. Patient taken out of facility by NT via wheelchair. Patient in stable condition at time of transfer.

## 2013-04-06 NOTE — Evaluation (Signed)
Physical Therapy Evaluation Patient Details Name: Reginald Waller MRN: [id number] DOB: 02-Aug-1919 Today's Date: 04/06/2013 Time: 2841-3244 PT Time Calculation (min): 32 min  PT Assessment / Plan / Recommendation Clinical Impression  Pt was seen for eval. following recent illness.  He and wife live at [facility] where he needs some assist with all ADLs.  His breathing in not labored at this time and O2 sat on RA with exertion is 91%.  He is able to transfer OOB with min guard assit and he walked 30' with a walker, SBA.  He is primarily W/C dependent at [facility].  He is essentially at functional baseline.  We can resume HHPT at discharge.    PT Assessment  Patient needs continued PT services    Follow Up Recommendations  Home health PT    Does the patient have the potential to tolerate intense rehabilitation      Barriers to Discharge None      Equipment Recommendations  None recommended by PT    Recommendations for Other Services     Frequency Min 2X/week    Precautions / Restrictions Precautions Precautions: Fall Restrictions Weight Bearing Restrictions: No   Pertinent Vitals/Pain       Mobility  Bed Mobility Bed Mobility: Supine to Sit;Sit to Supine Supine to Sit: 4: Min assist;HOB elevated Sit to Supine: 5: Supervision;HOB flat Transfers Transfers: Sit to Stand;Stand to Sit Sit to Stand: 4: Min guard;From chair/3-in-1;From bed Stand to Sit: 5: Supervision;To chair/3-in-1;To bed Ambulation/Gait Ambulation/Gait Assistance: 5: Supervision Ambulation Distance (Feet): 40 Feet Assistive device: Rolling walker Ambulation/Gait Assistance Details: pt had a mild stroke in the past which causes mild gait abnormality/L foot drag Gait Pattern: Step-through pattern;Decreased hip/knee flexion - left;Decreased dorsiflexion - left;Decreased step length - left Gait velocity: slow General Gait Details: pt's primary mobility is with a w/c Stairs: No Wheelchair Mobility Wheelchair  Mobility: No    Exercises     PT Diagnosis: Difficulty walking;Generalized weakness  PT Problem List: Decreased strength;Decreased activity tolerance;Decreased mobility;Cardiopulmonary status limiting activity PT Treatment Interventions: Gait training;Therapeutic activities;Therapeutic exercise   PT Goals Acute Rehab PT Goals PT Goal Formulation: With patient Time For Goal Achievement: 04/13/13 Potential to Achieve Goals: Good Pt will go Supine/Side to Sit: with supervision;with HOB not 0 degrees (comment degree) PT Goal: Supine/Side to Sit - Progress: Goal set today Pt will go Sit to Stand: with modified independence;with upper extremity assist PT Goal: Sit to Stand - Progress: Goal set today Pt will Ambulate: 51 - 150 feet;with supervision;with rolling walker PT Goal: Ambulate - Progress: Goal set today  Visit Information  Last PT Received On: 04/06/13    Subjective Data  Subjective: my wife is a patient upstairs Patient Stated Goal: return to [employer]   Prior Functioning  Home Living Lives With: Spouse Available Help at Discharge: Personal care attendant Type of Home: Assisted living Home Access: Level entry Home Layout: One level Bathroom Shower/Tub: Walk-in shower Home Adaptive Equipment: Walker - rolling;Wheelchair - manual Prior Function Level of Independence: Needs assistance Needs Assistance: Bathing;Dressing;Grooming;Toileting;Meal Prep;Light Housekeeping;Gait;Transfers Bath: Maximal Dressing: Moderate Grooming: Moderate Toileting: Moderate Meal Prep: Total Light Housekeeping: Total Gait Assistance: SBA with walker for short distances Transfer Assistance: min to SBA Able to Take Stairs?: No Driving: No Vocation: Retired Musician: Surveyor, mining Arousal/Alertness: Awake/alert Behavior During Therapy: WFL for tasks assessed/performed Overall Cognitive Status: Within Functional Limits for tasks assessed     Extremity/Trunk Assessment Right Lower Extremity Assessment RLE ROM/Strength/Tone:  WFL for tasks assessed RLE Sensation: WFL - Light Touch RLE Coordination: WFL - gross motor Left Lower Extremity Assessment LLE ROM/Strength/Tone: WFL for tasks assessed LLE Sensation: WFL - Light Touch LLE Coordination: WFL - gross motor Trunk Assessment Trunk Assessment: Kyphotic   Balance Balance Balance Assessed: Yes Dynamic Sitting Balance Dynamic Sitting - Balance Support: No upper extremity supported;Feet supported Dynamic Sitting - Level of Assistance: 7: Independent  End of Session PT - End of Session Equipment Utilized During Treatment: Gait belt Activity Tolerance: Patient tolerated treatment well Patient left: in bed;with call bell/phone within reach;with bed alarm set Nurse Communication: Mobility status  GP     Konrad Penta 04/06/2013, 9:43 AM

## 2013-04-12 ENCOUNTER — Encounter (HOSPITAL_COMMUNITY): Payer: Self-pay | Admitting: *Deleted

## 2013-04-12 ENCOUNTER — Emergency Department (HOSPITAL_COMMUNITY): Payer: Medicare Other

## 2013-04-12 ENCOUNTER — Emergency Department (HOSPITAL_COMMUNITY)
Admission: EM | Admit: 2013-04-12 | Discharge: 2013-04-12 | Disposition: A | Discharge status: Skilled Nursing Facility | Payer: Medicare Other | Attending: Emergency Medicine | Admitting: Emergency Medicine

## 2013-04-12 DIAGNOSIS — Z79899 Other long term (current) drug therapy: Secondary | ICD-10-CM | POA: Insufficient documentation

## 2013-04-12 DIAGNOSIS — R0989 Other specified symptoms and signs involving the circulatory and respiratory systems: Secondary | ICD-10-CM | POA: Insufficient documentation

## 2013-04-12 DIAGNOSIS — R0600 Dyspnea, unspecified: Secondary | ICD-10-CM

## 2013-04-12 DIAGNOSIS — I10 Essential (primary) hypertension: Secondary | ICD-10-CM | POA: Insufficient documentation

## 2013-04-12 DIAGNOSIS — J40 Bronchitis, not specified as acute or chronic: Secondary | ICD-10-CM

## 2013-04-12 DIAGNOSIS — Z8673 Personal history of transient ischemic attack (TIA), and cerebral infarction without residual deficits: Secondary | ICD-10-CM | POA: Insufficient documentation

## 2013-04-12 DIAGNOSIS — R062 Wheezing: Secondary | ICD-10-CM | POA: Insufficient documentation

## 2013-04-12 DIAGNOSIS — R05 Cough: Secondary | ICD-10-CM | POA: Insufficient documentation

## 2013-04-12 DIAGNOSIS — F411 Generalized anxiety disorder: Secondary | ICD-10-CM | POA: Insufficient documentation

## 2013-04-12 DIAGNOSIS — Z7982 Long term (current) use of aspirin: Secondary | ICD-10-CM | POA: Insufficient documentation

## 2013-04-12 DIAGNOSIS — R059 Cough, unspecified: Secondary | ICD-10-CM | POA: Insufficient documentation

## 2013-04-12 DIAGNOSIS — J3489 Other specified disorders of nose and nasal sinuses: Secondary | ICD-10-CM | POA: Insufficient documentation

## 2013-04-12 DIAGNOSIS — R0609 Other forms of dyspnea: Secondary | ICD-10-CM | POA: Insufficient documentation

## 2013-04-12 DIAGNOSIS — D649 Anemia, unspecified: Secondary | ICD-10-CM

## 2013-04-12 DIAGNOSIS — Z87891 Personal history of nicotine dependence: Secondary | ICD-10-CM | POA: Insufficient documentation

## 2013-04-12 DIAGNOSIS — J209 Acute bronchitis, unspecified: Secondary | ICD-10-CM | POA: Insufficient documentation

## 2013-04-12 DIAGNOSIS — M129 Arthropathy, unspecified: Secondary | ICD-10-CM | POA: Insufficient documentation

## 2013-04-12 DIAGNOSIS — E785 Hyperlipidemia, unspecified: Secondary | ICD-10-CM | POA: Insufficient documentation

## 2013-04-12 DIAGNOSIS — Z8551 Personal history of malignant neoplasm of bladder: Secondary | ICD-10-CM | POA: Insufficient documentation

## 2013-04-12 DIAGNOSIS — Z8613 Personal history of malaria: Secondary | ICD-10-CM | POA: Insufficient documentation

## 2013-04-12 LAB — CBC WITH DIFFERENTIAL/PLATELET
Basophils Absolute: 0 10*3/uL (ref 0.0–0.1)
Eosinophils Relative: 1 % (ref 0–5)
Lymphocytes Relative: 10 % — ABNORMAL LOW (ref 12–46)
Neutro Abs: 13.3 10*3/uL — ABNORMAL HIGH (ref 1.7–7.7)
Neutrophils Relative %: 76 % (ref 43–77)
Platelets: 569 10*3/uL — ABNORMAL HIGH (ref 150–400)
RDW: 16.8 % — ABNORMAL HIGH (ref 11.5–15.5)
WBC: 17.5 10*3/uL — ABNORMAL HIGH (ref 4.0–10.5)

## 2013-04-12 LAB — COMPREHENSIVE METABOLIC PANEL
ALT: 10 U/L (ref 0–53)
AST: 17 U/L (ref 0–37)
CO2: 26 mEq/L (ref 19–32)
Calcium: 8.7 mg/dL (ref 8.4–10.5)
Chloride: 101 mEq/L (ref 96–112)
GFR calc non Af Amer: 27 mL/min — ABNORMAL LOW (ref >90)
Sodium: 138 mEq/L (ref 135–145)

## 2013-04-12 MED ORDER — IPRATROPIUM BROMIDE 0.02 % IN SOLN
0.5000 mg | Freq: Once | RESPIRATORY_TRACT | Status: AC
  Administered 2013-04-12: 0.5 mg via RESPIRATORY_TRACT
  Filled 2013-04-12: qty 2.5

## 2013-04-12 MED ORDER — ALBUTEROL SULFATE (5 MG/ML) 0.5% IN NEBU
5.0000 mg | INHALATION_SOLUTION | Freq: Once | RESPIRATORY_TRACT | Status: AC
  Administered 2013-04-12: 5 mg via RESPIRATORY_TRACT
  Filled 2013-04-12: qty 1

## 2013-04-12 MED ORDER — AZITHROMYCIN 250 MG PO TABS: 250.0000 mg | ORAL_TABLET | Freq: Every day | ORAL | Status: DC

## 2013-04-12 MED ORDER — LORAZEPAM 1 MG PO TABS
1.0000 mg | ORAL_TABLET | Freq: Once | ORAL | Status: AC
  Administered 2013-04-12: 1 mg via ORAL
  Filled 2013-04-12: qty 1

## 2013-04-12 MED ORDER — AZITHROMYCIN 250 MG PO TABS
ORAL_TABLET | ORAL | Status: AC
  Administered 2013-04-12: 500 mg
  Filled 2013-04-12: qty 2

## 2013-04-12 NOTE — ED Notes (Signed)
RT called

## 2013-04-12 NOTE — ED Notes (Signed)
Pt states breathing better after treatment. NAD noted when pt returned back to room. 99% on RA.

## 2013-04-12 NOTE — ED Notes (Signed)
Pt resting calmly w/ eyes closed. Rise & fall of the chest noted. Bed in low position, side rails up x2. NAD noted at this time.  

## 2013-04-12 NOTE — ED Provider Notes (Signed)
History  This chart was scribed for Geoffery Lyons, MD by Bennett Scrape, ED Scribe. This patient was seen in room APA15/[facility] and the patient's care was started at 5:51 PM.  CSN: [id number]  Arrival date & time 04/12/13  1658   First MD Initiated Contact with Patient 04/12/13 1751      Chief Complaint  Patient presents with  . Shortness of Breath  . Anxiety     The history is provided by the patient and a caregiver. No language interpreter was used.   HPI Comments: Reginald Waller is a 79 y.o. male with a h/o CHF brought in by ambulance from [facility], who presents to the Emergency Department complaining of SOB with associated anxiety per staff. Pt also reports associated nasal congestion, productive cough and wheezing. Pt states he has been having difficulty sleeping because of the SOB. He denies fever as an associated sympotm. Caregiver denies that the pt has a h/o COPD and emphesyma.  She reports that the pt has been seen several times for the same and has received breathing treatments with improvment. Pt also has h/o of metastatic bladder cancer.   Per nursing note, staff sent the pt to the ED because "I needed a break, I can't take his complaining anymore".    Past Medical History  Diagnosis Date  . Hypertension   . Arthritis   . Stroke   . Malaria   . Urothelial cancer 12/16/2012  . Anemia   . Hyperlipidemia     Past Surgical History  Procedure Laterality Date  . Hernia repair    . Cataract extraction    . Appendectomy    . Eye surgery    . Cystoscopy  12/08/2012    Procedure: CYSTOSCOPY FLEXIBLE;  Surgeon: Ky Barban, MD;  Location: [facility];  Service: Urology;  Laterality: N/A;  . Transurethral resection of bladder tumor  12/09/2012    Procedure: TRANSURETHRAL RESECTION OF BLADDER TUMOR (TURBT);  Surgeon: Ky Barban, MD;  Location: [facility];  Service: Urology;  Laterality: N/A;    Family History  Problem Relation Age of Onset  . Diabetes Mother    . Diabetes Father   . Cancer Sister     breast  . Cancer Sister     thyroid  . Cancer Brother     leukemia  . Cancer Brother   . Coronary artery disease Brother     History  Substance Use Topics  . Smoking status: Former Games developer  . Smokeless tobacco: Not on file  . Alcohol Use: No      Review of Systems  Constitutional: Negative for chills.  HENT: Negative for rhinorrhea and sneezing.   Respiratory: Positive for cough and shortness of breath.   Cardiovascular: Negative for chest pain.  All other systems reviewed and are negative.    Allergies  Shellfish allergy  Home Medications   Current Outpatient Rx  Name  Route  Sig  Dispense  Refill  . albuterol (PROVENTIL HFA;VENTOLIN HFA) 108 (90 BASE) MCG/ACT inhaler   Inhalation   Inhale 1-2 puffs into the lungs every 6 (six) hours as needed for wheezing.   1 Inhaler   0   . amLODipine (NORVASC) 10 MG tablet   Oral   Take 1 tablet (10 mg total) by mouth daily.   30 tablet   0   . aspirin EC 325 MG tablet   Oral   Take 1 tablet (325 mg total) by mouth daily.   30  tablet   0   . carvedilol (COREG) 3.125 MG tablet   Oral   Take 1 tablet (3.125 mg total) by mouth 2 (two) times daily with a meal.   30 tablet   0   . docusate sodium 100 MG CAPS   Oral   Take 200 mg by mouth daily.   10 capsule      . furosemide (LASIX) 20 MG tablet   Oral   Take 2 tablets (40 mg total) by mouth daily.   20 tablet   0   . iron polysaccharides (NIFEREX) 150 MG capsule   Oral   Take 1 capsule (150 mg total) by mouth daily.   30 capsule   1   . latanoprost (XALATAN) 0.005 % ophthalmic solution   Both Eyes   Place 1 drop into both eyes at bedtime.         Marland Kitchen levothyroxine (SYNTHROID, LEVOTHROID) 100 MCG tablet   Oral   Take 100 mcg by mouth daily before breakfast.         . LORazepam (ATIVAN) 0.5 MG tablet   Oral   Take 0.5 tablets (0.25 mg total) by mouth every 6 (six) hours as needed for anxiety.   30  tablet   0   . simvastatin (ZOCOR) 20 MG tablet   Oral   Take 20 mg by mouth every morning.          . terazosin (HYTRIN) 1 MG capsule   Oral   Take 1 mg by mouth at bedtime.         Marland Kitchen acetaminophen (ARTHRITIS PAIN RELIEVER) 650 MG CR tablet   Oral   Take 650 mg by mouth daily as needed. For pain         . food thickener (THICK IT) POWD   Oral   Take 1 g by mouth as needed (thicken liquids).           Triage Vitals: Pulse 72  Temp(Src) 98.3 F (36.8 C) (Oral)  Resp 16  SpO2 99%  Physical Exam  Nursing note and vitals reviewed. Constitutional: He is oriented to person, place, and time. He appears well-developed and well-nourished.  HENT:  Head: Normocephalic and atraumatic.  Right Ear: External ear normal.  Left Ear: External ear normal.  Nose: Nose normal.  Mouth/Throat: Oropharynx is clear and moist.  Eyes: Conjunctivae are normal. Pupils are equal, round, and reactive to light.  Neck: Normal range of motion. Neck supple.  Cardiovascular: Normal rate, regular rhythm and normal heart sounds.   Pulmonary/Chest: Effort normal. No respiratory distress. He has wheezes. He has no rales.  There are bilateral inspiratory and expiratory wheezes present.   Abdominal: Soft. Bowel sounds are normal.  Musculoskeletal: Normal range of motion. He exhibits no edema.  Neurological: He is alert and oriented to person, place, and time.  Skin: Skin is warm and dry. No erythema.  Psychiatric: He has a normal mood and affect. His behavior is normal.    ED Course  Procedures (including critical care time)  DIAGNOSTIC STUDIES: Oxygen Saturation is 99% on room air , normal by my interpretation.    COORDINATION OF CARE:  5:57 PM Discussed treatment plan with pt which includes a breathing treatment and CXR. Pt agrees.   Labs Reviewed - No data to display No results found.   No diagnosis found.   Date: 04/12/2013  Rate: 78  Rhythm: normal sinus rhythm  QRS Axis:  normal  Intervals: PR prolonged  ST/T Wave abnormalities: nonspecific T wave changes  Conduction Disutrbances:first degree av block  Narrative Interpretation:   Old EKG Reviewed: unchanged    MDM  The patient presents with dyspnea and cough.  I see no evidence for pneumonia on the chest xray and the workup is otherwise unremarkable with the exception of his Hb which is 7.9 and has been slowly trending downward.  He is heme positive but there is no melena or gross blood.  I doubt a GI bleed is the cause of his dyspnea.  He has no difficulty speaking and appears quite anxious.  I have spoken with Dr. Jena Gauss about this patient who feels as though outpatient follow up is appropriate.     I personally performed the services described in this documentation, which was scribed in my presence. The recorded information has been reviewed and is accurate.     Geoffery Lyons, MD 04/12/13 2025

## 2013-04-12 NOTE — ED Notes (Signed)
Spoke w/ Eileen Stanford, RN to give report. Stated they did have someone to pick up pt.

## 2013-04-12 NOTE — ED Notes (Signed)
Pt is a resident at [facility], co not being able to breathe, O2 sats 98% RA, also anxious, nursing staff at [facility] states they cannot do anything else with him. Per EMS, nurse stated she "needed a break, I can't take his complaining anymore".  Pt is DNR but has complained of feeling like he is dying today.

## 2013-04-14 ENCOUNTER — Telehealth: Payer: Self-pay | Admitting: Internal Medicine

## 2013-04-14 ENCOUNTER — Ambulatory Visit: Payer: Medicare Other | Admitting: Gastroenterology

## 2013-04-14 NOTE — Telephone Encounter (Signed)
Noted  

## 2013-04-14 NOTE — Telephone Encounter (Signed)
Per RMR patient needed an ASAP OV and I had him on for this morning at 0930. A family member called this morning to cancel because his wife had been taken to the ER and they could not [facility] at this time.

## 2013-04-16 ENCOUNTER — Encounter (HOSPITAL_COMMUNITY): Payer: Medicare Other

## 2013-04-16 ENCOUNTER — Emergency Department (HOSPITAL_COMMUNITY)
Admission: EM | Admit: 2013-04-16 | Discharge: 2013-04-16 | Disposition: A | Discharge status: Home / Self Care | Payer: Medicare Other | Source: Home / Self Care | Attending: Emergency Medicine | Admitting: Emergency Medicine

## 2013-04-16 ENCOUNTER — Emergency Department (HOSPITAL_COMMUNITY): Payer: Medicare Other

## 2013-04-16 ENCOUNTER — Encounter (HOSPITAL_COMMUNITY): Payer: Self-pay

## 2013-04-16 DIAGNOSIS — R05 Cough: Secondary | ICD-10-CM | POA: Insufficient documentation

## 2013-04-16 DIAGNOSIS — R0602 Shortness of breath: Secondary | ICD-10-CM

## 2013-04-16 DIAGNOSIS — D649 Anemia, unspecified: Secondary | ICD-10-CM | POA: Insufficient documentation

## 2013-04-16 DIAGNOSIS — I10 Essential (primary) hypertension: Secondary | ICD-10-CM | POA: Insufficient documentation

## 2013-04-16 DIAGNOSIS — I509 Heart failure, unspecified: Secondary | ICD-10-CM | POA: Insufficient documentation

## 2013-04-16 DIAGNOSIS — Z792 Long term (current) use of antibiotics: Secondary | ICD-10-CM | POA: Insufficient documentation

## 2013-04-16 DIAGNOSIS — R059 Cough, unspecified: Secondary | ICD-10-CM | POA: Insufficient documentation

## 2013-04-16 DIAGNOSIS — Z8551 Personal history of malignant neoplasm of bladder: Secondary | ICD-10-CM | POA: Insufficient documentation

## 2013-04-16 DIAGNOSIS — Z7982 Long term (current) use of aspirin: Secondary | ICD-10-CM | POA: Insufficient documentation

## 2013-04-16 DIAGNOSIS — Z79899 Other long term (current) drug therapy: Secondary | ICD-10-CM | POA: Insufficient documentation

## 2013-04-16 DIAGNOSIS — J3489 Other specified disorders of nose and nasal sinuses: Secondary | ICD-10-CM | POA: Insufficient documentation

## 2013-04-16 DIAGNOSIS — Z8673 Personal history of transient ischemic attack (TIA), and cerebral infarction without residual deficits: Secondary | ICD-10-CM | POA: Insufficient documentation

## 2013-04-16 DIAGNOSIS — R5381 Other malaise: Secondary | ICD-10-CM | POA: Insufficient documentation

## 2013-04-16 DIAGNOSIS — R5383 Other fatigue: Secondary | ICD-10-CM | POA: Insufficient documentation

## 2013-04-16 DIAGNOSIS — R062 Wheezing: Secondary | ICD-10-CM | POA: Insufficient documentation

## 2013-04-16 DIAGNOSIS — Z87891 Personal history of nicotine dependence: Secondary | ICD-10-CM | POA: Insufficient documentation

## 2013-04-16 DIAGNOSIS — Z8559 Personal history of malignant neoplasm of other urinary tract organ: Secondary | ICD-10-CM | POA: Insufficient documentation

## 2013-04-16 DIAGNOSIS — F411 Generalized anxiety disorder: Secondary | ICD-10-CM | POA: Insufficient documentation

## 2013-04-16 DIAGNOSIS — M129 Arthropathy, unspecified: Secondary | ICD-10-CM | POA: Insufficient documentation

## 2013-04-16 LAB — BASIC METABOLIC PANEL
CO2: 26 mEq/L (ref 19–32)
Calcium: 8.6 mg/dL (ref 8.4–10.5)
Chloride: 99 mEq/L (ref 96–112)
Creatinine, Ser: 1.94 mg/dL — ABNORMAL HIGH (ref 0.50–1.35)
Glucose, Bld: 98 mg/dL (ref 70–99)

## 2013-04-16 LAB — CBC
HCT: 21.3 % — ABNORMAL LOW (ref 39.0–52.0)
HCT: 23 % — ABNORMAL LOW (ref 39.0–52.0)
Hemoglobin: 7 g/dL — ABNORMAL LOW (ref 13.0–17.0)
MCH: 28.9 pg (ref 26.0–34.0)
MCHC: 32.2 g/dL (ref 30.0–36.0)
MCV: 88 fL (ref 78.0–100.0)
MCV: 88.1 fL (ref 78.0–100.0)
Platelets: 513 10*3/uL — ABNORMAL HIGH (ref 150–400)
Platelets: 585 10*3/uL — ABNORMAL HIGH (ref 150–400)
RBC: 2.42 MIL/uL — ABNORMAL LOW (ref 4.22–5.81)
RDW: 16.4 % — ABNORMAL HIGH (ref 11.5–15.5)
WBC: 10 10*3/uL (ref 4.0–10.5)

## 2013-04-16 LAB — PRO B NATRIURETIC PEPTIDE: Pro B Natriuretic peptide (BNP): 11816 pg/mL — ABNORMAL HIGH (ref 0–450)

## 2013-04-16 LAB — TYPE AND SCREEN

## 2013-04-16 MED ORDER — ALBUTEROL SULFATE (5 MG/ML) 0.5% IN NEBU
2.5000 mg | INHALATION_SOLUTION | Freq: Once | RESPIRATORY_TRACT | Status: AC
  Administered 2013-04-16: 2.5 mg via RESPIRATORY_TRACT
  Filled 2013-04-16: qty 0.5

## 2013-04-16 MED ORDER — OXYMETAZOLINE HCL 0.05 % NA SOLN
1.0000 | Freq: Once | NASAL | Status: AC
  Administered 2013-04-16: 1 via NASAL
  Filled 2013-04-16: qty 15

## 2013-04-16 NOTE — ED Notes (Signed)
Pt from [facility] with cough, congestion, sob and weakness

## 2013-04-16 NOTE — Progress Notes (Unsigned)
Reginald Waller presented for labwork. Labs per MD order drawn via Peripheral Line 23 gauge needle inserted in left forearm.  Good blood return present. Procedure without incident.  Needle removed intact. Patient tolerated procedure well.

## 2013-04-16 NOTE — ED Notes (Signed)
Phoned [facility] and informed them of patient status and that orders for the patient .

## 2013-04-16 NOTE — ED Provider Notes (Addendum)
History     CSN: [id number]  Arrival date & time 04/16/13  1308   First MD Initiated Contact with Patient 04/16/13 0253      Chief Complaint  Patient presents with  . Shortness of Breath  . Weakness    (Consider location/radiation/quality/duration/timing/severity/associated sxs/prior treatment) HPI HPI Comments: Reginald Waller is a 79 y.o. male [facility] of [facility] with a h/o anxiety, HTN, anemia, bladderl cancer requiring an indwelling foley,  brought in by ambulance, who presents to the Emergency Department complaining of nasal congestion, difficulty breathing, and a cough. He states he has had the cough for "a while".He states he has difficulty sleeping due to the shortness of breath.  Denies fever, chills, nausea, vomiting.  DNR on chart.  PCP Dr. Regino Schultze Urology Dr. Jerre Simon  Past Medical History  Diagnosis Date  . Hypertension   . Arthritis   . Stroke   . Malaria   . Urothelial cancer 12/16/2012  . Anemia   . Hyperlipidemia     Past Surgical History  Procedure Laterality Date  . Hernia repair    . Cataract extraction    . Appendectomy    . Eye surgery    . Cystoscopy  12/08/2012    Procedure: CYSTOSCOPY FLEXIBLE;  Surgeon: Ky Barban, MD;  Location: [facility];  Service: Urology;  Laterality: N/A;  . Transurethral resection of bladder tumor  12/09/2012    Procedure: TRANSURETHRAL RESECTION OF BLADDER TUMOR (TURBT);  Surgeon: Ky Barban, MD;  Location: [facility];  Service: Urology;  Laterality: N/A;    Family History  Problem Relation Age of Onset  . Diabetes Mother   . Diabetes Father   . Cancer Sister     breast  . Cancer Sister     thyroid  . Cancer Brother     leukemia  . Cancer Brother   . Coronary artery disease Brother     History  Substance Use Topics  . Smoking status: Former Games developer  . Smokeless tobacco: Not on file  . Alcohol Use: No      Review of Systems  Constitutional: Negative for fever.       10 Systems reviewed and are  negative for acute change except as noted in the HPI.  HENT: Negative for congestion.        Nasal congestion  Eyes: Negative for discharge and redness.  Respiratory: Positive for cough, shortness of breath and wheezing.   Cardiovascular: Negative for chest pain.  Gastrointestinal: Negative for vomiting and abdominal pain.  Musculoskeletal: Negative for back pain.  Skin: Negative for rash.  Neurological: Negative for syncope, numbness and headaches.  Psychiatric/Behavioral:       No behavior change.    Allergies  Shellfish allergy  Home Medications   Current Outpatient Rx  Name  Route  Sig  Dispense  Refill  . acetaminophen (ARTHRITIS PAIN RELIEVER) 650 MG CR tablet   Oral   Take 650 mg by mouth daily as needed. For pain         . albuterol (PROVENTIL HFA;VENTOLIN HFA) 108 (90 BASE) MCG/ACT inhaler   Inhalation   Inhale 1-2 puffs into the lungs every 6 (six) hours as needed for wheezing.   1 Inhaler   0   . amLODipine (NORVASC) 10 MG tablet   Oral   Take 1 tablet (10 mg total) by mouth daily.   30 tablet   0   . aspirin EC 325 MG tablet   Oral   Take  1 tablet (325 mg total) by mouth daily.   30 tablet   0   . azithromycin (ZITHROMAX) 250 MG tablet   Oral   Take 1 tablet (250 mg total) by mouth daily. Take first 2 tablets together, then 1 every day until finished.   6 tablet   0   . carvedilol (COREG) 3.125 MG tablet   Oral   Take 1 tablet (3.125 mg total) by mouth 2 (two) times daily with a meal.   30 tablet   0   . docusate sodium 100 MG CAPS   Oral   Take 200 mg by mouth daily.   10 capsule      . food thickener (THICK IT) POWD   Oral   Take 1 g by mouth as needed (thicken liquids).         . furosemide (LASIX) 20 MG tablet   Oral   Take 2 tablets (40 mg total) by mouth daily.   20 tablet   0   . iron polysaccharides (NIFEREX) 150 MG capsule   Oral   Take 1 capsule (150 mg total) by mouth daily.   30 capsule   1   . latanoprost  (XALATAN) 0.005 % ophthalmic solution   Both Eyes   Place 1 drop into both eyes at bedtime.         Marland Kitchen levothyroxine (SYNTHROID, LEVOTHROID) 100 MCG tablet   Oral   Take 100 mcg by mouth daily before breakfast.         . LORazepam (ATIVAN) 0.5 MG tablet   Oral   Take 0.5 tablets (0.25 mg total) by mouth every 6 (six) hours as needed for anxiety.   30 tablet   0   . simvastatin (ZOCOR) 20 MG tablet   Oral   Take 20 mg by mouth every morning.          . terazosin (HYTRIN) 1 MG capsule   Oral   Take 1 mg by mouth at bedtime.           BP 141/50  Pulse 73  Temp(Src) 97.9 F (36.6 C) (Rectal)  Resp 18  Ht 5\' 8"  (1.727 m)  Wt 145 lb (65.772 kg)  BMI 22.05 kg/m2  SpO2 100%  Physical Exam  Nursing note and vitals reviewed. Constitutional: He appears well-developed and well-nourished.  Awake, alert, nontoxic appearance.  HENT:  Head: Normocephalic and atraumatic.  Right Ear: External ear normal.  Left Ear: External ear normal.  Eyes: EOM are normal. Pupils are equal, round, and reactive to light.  Neck: Neck supple.  Cardiovascular: Normal rate and intact distal pulses.   Pulmonary/Chest: Effort normal and breath sounds normal. He has no wheezes. He exhibits no tenderness.  cough  Abdominal: Soft. Bowel sounds are normal. There is no tenderness. There is no rebound.  Genitourinary:  Leg bag and foley catheter intact  Musculoskeletal: He exhibits no tenderness.  Baseline ROM, no obvious new focal weakness.  Neurological:  Mental status and motor strength appears baseline for patient and situation.  Skin: No rash noted.  Psychiatric: He has a normal mood and affect.    ED Course  Procedures (including critical care time) Results for orders placed during the hospital encounter of 04/16/13  CBC      Result Value Range   WBC 10.0  4.0 - 10.5 K/uL   RBC 2.42 (*) 4.22 - 5.81 MIL/uL   Hemoglobin 7.0 (*) 13.0 - 17.0 g/dL   HCT 16.1 (*)  39.0 - 52.0 %   MCV 88.0   78.0 - 100.0 fL   MCH 28.9  26.0 - 34.0 pg   MCHC 32.9  30.0 - 36.0 g/dL   RDW 16.1 (*) 09.6 - 04.5 %   Platelets 513 (*) 150 - 400 K/uL  BASIC METABOLIC PANEL      Result Value Range   Sodium 137  135 - 145 mEq/L   Potassium 4.2  3.5 - 5.1 mEq/L   Chloride 99  96 - 112 mEq/L   CO2 26  19 - 32 mEq/L   Glucose, Bld 98  70 - 99 mg/dL   BUN 53 (*) 6 - 23 mg/dL   Creatinine, Ser 4.09 (*) 0.50 - 1.35 mg/dL   Calcium 8.6  8.4 - 81.1 mg/dL   GFR calc non Af Amer 29 (*) >90 mL/min   GFR calc Af Amer 33 (*) >90 mL/min  PRO B NATRIURETIC PEPTIDE      Result Value Range   Pro B Natriuretic peptide (BNP) 11816.0 (*) 0 - 450 pg/mL  TYPE AND SCREEN      Result Value Range   ABO/RH(D) A POS     Antibody Screen PENDING     Sample Expiration 04/19/2013      Dg Chest Port 1 View  04/16/2013   *RADIOLOGY REPORT*  Clinical Data: Shortness of breath.  PORTABLE CHEST - 1 VIEW  Comparison: 04/12/2013.  Findings: The cardiac silhouette, mediastinal and hilar contours are stable.  There is tortuosity and calcification of the thoracic aorta.  Slight worsening lung aeration likely a combination of edema and atelectasis.  Small pleural effusions are suspected.  IMPRESSION: Slight worsening lung aeration likely due to mild edema and atelectasis.   Original Report Authenticated By: Rudie Meyer, M.D.   Medications  albuterol (PROVENTIL) (5 MG/ML) 0.5% nebulizer solution 2.5 mg (2.5 mg Nebulization Given 04/16/13 0304)  oxymetazoline (AFRIN) 0.05 % nasal spray 1 spray (1 spray Each Nare Given 04/16/13 0313)   4:31 AM:  T/C to  Dr. Rito Ehrlich, case discussed, including:  HPI, pertinent PM/SHx, VS/PE, dx testing, ED course and treatment.  Discussed need for transfusion. No other reason for admission. Patient is stable hemodynamically. He is not longer short of breath after receiving albuterol and afrin. On room air O2 sat is 96%. He agrees that OP transfusion would be appropriate. 58 Orders written for [facility] to call the specialty clinic to arrange for transfusion over the next 48 hours. Transfuse 2 units PRBC each over an hour. Give Lasix 20 mg between units. Notify Dr. Regino Schultze of transfusion.  MDM  Patient from SNF here with shortness of breath, cough and nasal congestion.Given albuterol and afrin with relief.  Hgb is now 7 which has dropped over the last six weeks. Consulted with hospitalist re need for admission vs outpatient transfusion. Have written orders for transfusion. Patient to return to [facility] who will make the arrangements for the outpatient transfusion. Type and screen is pending at time of discharge. Pt stable in ED with no significant deterioration in condition.The patient appears reasonably screened and/or stabilized for discharge and I doubt any other medical condition or other [facility] requiring further screening, evaluation, or treatment in the ED at this time prior to discharge.  MDM Reviewed: nursing note and vitals Interpretation: labs and x-ray           Nicoletta Dress. Colon Branch, MD 04/16/13 9147  Nicoletta Dress. Colon Branch, MD 04/16/13 [phone]

## 2013-04-17 ENCOUNTER — Encounter (HOSPITAL_COMMUNITY): Payer: Medicare Other

## 2013-04-17 ENCOUNTER — Emergency Department (HOSPITAL_COMMUNITY): Payer: Medicare Other

## 2013-04-17 ENCOUNTER — Other Ambulatory Visit: Payer: Self-pay

## 2013-04-17 ENCOUNTER — Inpatient Hospital Stay (HOSPITAL_COMMUNITY)
Admission: EM | Admit: 2013-04-17 | Discharge: 2013-04-19 | DRG: 291 | Disposition: A | Discharge status: Nursing Home (Includes ICF) | Payer: Medicare Other | Attending: Internal Medicine | Admitting: Internal Medicine

## 2013-04-17 ENCOUNTER — Encounter (HOSPITAL_COMMUNITY): Payer: Self-pay | Admitting: *Deleted

## 2013-04-17 VITALS — BP 167/59 | HR 79 | Temp 98.2°F | Resp 24

## 2013-04-17 DIAGNOSIS — D649 Anemia, unspecified: Secondary | ICD-10-CM

## 2013-04-17 DIAGNOSIS — I10 Essential (primary) hypertension: Secondary | ICD-10-CM

## 2013-04-17 DIAGNOSIS — Z8249 Family history of ischemic heart disease and other diseases of the circulatory system: Secondary | ICD-10-CM

## 2013-04-17 DIAGNOSIS — Z8673 Personal history of transient ischemic attack (TIA), and cerebral infarction without residual deficits: Secondary | ICD-10-CM

## 2013-04-17 DIAGNOSIS — N3289 Other specified disorders of bladder: Secondary | ICD-10-CM

## 2013-04-17 DIAGNOSIS — I5031 Acute diastolic (congestive) heart failure: Principal | ICD-10-CM

## 2013-04-17 DIAGNOSIS — J189 Pneumonia, unspecified organism: Secondary | ICD-10-CM

## 2013-04-17 DIAGNOSIS — C679 Malignant neoplasm of bladder, unspecified: Secondary | ICD-10-CM

## 2013-04-17 DIAGNOSIS — D62 Acute posthemorrhagic anemia: Secondary | ICD-10-CM

## 2013-04-17 DIAGNOSIS — J9811 Atelectasis: Secondary | ICD-10-CM

## 2013-04-17 DIAGNOSIS — I129 Hypertensive chronic kidney disease with stage 1 through stage 4 chronic kidney disease, or unspecified chronic kidney disease: Secondary | ICD-10-CM | POA: Diagnosis present

## 2013-04-17 DIAGNOSIS — N1330 Unspecified hydronephrosis: Secondary | ICD-10-CM

## 2013-04-17 DIAGNOSIS — R0602 Shortness of breath: Secondary | ICD-10-CM

## 2013-04-17 DIAGNOSIS — Z87891 Personal history of nicotine dependence: Secondary | ICD-10-CM

## 2013-04-17 DIAGNOSIS — I639 Cerebral infarction, unspecified: Secondary | ICD-10-CM

## 2013-04-17 DIAGNOSIS — Z91013 Allergy to seafood: Secondary | ICD-10-CM

## 2013-04-17 DIAGNOSIS — I509 Heart failure, unspecified: Secondary | ICD-10-CM

## 2013-04-17 DIAGNOSIS — N039 Chronic nephritic syndrome with unspecified morphologic changes: Secondary | ICD-10-CM | POA: Diagnosis present

## 2013-04-17 DIAGNOSIS — J90 Pleural effusion, not elsewhere classified: Secondary | ICD-10-CM

## 2013-04-17 DIAGNOSIS — N179 Acute kidney failure, unspecified: Secondary | ICD-10-CM

## 2013-04-17 DIAGNOSIS — L03114 Cellulitis of left upper limb: Secondary | ICD-10-CM

## 2013-04-17 DIAGNOSIS — Z806 Family history of leukemia: Secondary | ICD-10-CM

## 2013-04-17 DIAGNOSIS — Z833 Family history of diabetes mellitus: Secondary | ICD-10-CM

## 2013-04-17 DIAGNOSIS — N183 Chronic kidney disease, stage 3 unspecified: Secondary | ICD-10-CM

## 2013-04-17 DIAGNOSIS — Z79899 Other long term (current) drug therapy: Secondary | ICD-10-CM

## 2013-04-17 DIAGNOSIS — Z978 Presence of other specified devices: Secondary | ICD-10-CM

## 2013-04-17 DIAGNOSIS — J810 Acute pulmonary edema: Secondary | ICD-10-CM

## 2013-04-17 DIAGNOSIS — E875 Hyperkalemia: Secondary | ICD-10-CM

## 2013-04-17 DIAGNOSIS — Z9889 Other specified postprocedural states: Secondary | ICD-10-CM

## 2013-04-17 DIAGNOSIS — E039 Hypothyroidism, unspecified: Secondary | ICD-10-CM

## 2013-04-17 DIAGNOSIS — D631 Anemia in chronic kidney disease: Secondary | ICD-10-CM | POA: Diagnosis present

## 2013-04-17 DIAGNOSIS — E860 Dehydration: Secondary | ICD-10-CM

## 2013-04-17 DIAGNOSIS — N390 Urinary tract infection, site not specified: Secondary | ICD-10-CM

## 2013-04-17 LAB — POCT I-STAT TROPONIN I: Troponin i, poc: 0.05 ng/mL (ref 0.00–0.08)

## 2013-04-17 LAB — COMPREHENSIVE METABOLIC PANEL
AST: 24 U/L (ref 0–37)
Albumin: 3 g/dL — ABNORMAL LOW (ref 3.5–5.2)
CO2: 26 mEq/L (ref 19–32)
Calcium: 9.1 mg/dL (ref 8.4–10.5)
Creatinine, Ser: 1.84 mg/dL — ABNORMAL HIGH (ref 0.50–1.35)
GFR calc non Af Amer: 30 mL/min — ABNORMAL LOW (ref >90)
Total Protein: 7.3 g/dL (ref 6.0–8.3)

## 2013-04-17 LAB — CBC WITH DIFFERENTIAL/PLATELET
Basophils Absolute: 0.1 10*3/uL (ref 0.0–0.1)
Basophils Relative: 0 % (ref 0–1)
Eosinophils Absolute: 0.1 10*3/uL (ref 0.0–0.7)
Eosinophils Relative: 1 % (ref 0–5)
HCT: 32.4 % — ABNORMAL LOW (ref 39.0–52.0)
MCH: 28.5 pg (ref 26.0–34.0)
MCHC: 33 g/dL (ref 30.0–36.0)
MCV: 86.4 fL (ref 78.0–100.0)
Monocytes Absolute: 1.6 10*3/uL — ABNORMAL HIGH (ref 0.1–1.0)
RDW: 15.8 % — ABNORMAL HIGH (ref 11.5–15.5)

## 2013-04-17 LAB — TROPONIN I: Troponin I: <0.3 ng/mL (ref <0.30)

## 2013-04-17 MED ORDER — LORAZEPAM 0.5 MG PO TABS
0.2500 mg | ORAL_TABLET | Freq: Three times a day (TID) | ORAL | Status: DC | PRN
  Administered 2013-04-17 – 2013-04-18 (×2): 0.25 mg via ORAL
  Filled 2013-04-17 (×2): qty 1

## 2013-04-17 MED ORDER — SODIUM CHLORIDE 0.9 % IV SOLN
INTRAVENOUS | Status: DC
Start: 2013-04-17 — End: 2013-04-17
  Administered 2013-04-17: 21:00:00 via INTRAVENOUS

## 2013-04-17 MED ORDER — HEPARIN SODIUM (PORCINE) 5000 UNIT/ML IJ SOLN
5000.0000 [IU] | Freq: Three times a day (TID) | INTRAMUSCULAR | Status: DC
  Administered 2013-04-17 – 2013-04-19 (×6): 5000 [IU] via SUBCUTANEOUS
  Filled 2013-04-17 (×6): qty 1

## 2013-04-17 MED ORDER — SODIUM CHLORIDE 0.9 % IV SOLN: 250.0000 mL | INTRAVENOUS | Status: DC | PRN

## 2013-04-17 MED ORDER — LEVOTHYROXINE SODIUM 100 MCG PO TABS
100.0000 ug | ORAL_TABLET | Freq: Every day | ORAL | Status: DC
  Administered 2013-04-18 – 2013-04-19 (×2): 100 ug via ORAL
  Filled 2013-04-17 (×2): qty 1

## 2013-04-17 MED ORDER — TERAZOSIN HCL 1 MG PO CAPS
1.0000 mg | ORAL_CAPSULE | Freq: Every day | ORAL | Status: DC
  Administered 2013-04-17 – 2013-04-18 (×2): 1 mg via ORAL
  Filled 2013-04-17 (×2): qty 1

## 2013-04-17 MED ORDER — OXYMETAZOLINE HCL 0.05 % NA SOLN
1.0000 | Freq: Once | NASAL | Status: AC
  Administered 2013-04-17: 1 via NASAL
  Filled 2013-04-17: qty 15

## 2013-04-17 MED ORDER — ALBUTEROL SULFATE (5 MG/ML) 0.5% IN NEBU
2.5000 mg | INHALATION_SOLUTION | Freq: Once | RESPIRATORY_TRACT | Status: AC
  Administered 2013-04-17: 2.5 mg via RESPIRATORY_TRACT
  Filled 2013-04-17: qty 0.5

## 2013-04-17 MED ORDER — ASPIRIN EC 325 MG PO TBEC
325.0000 mg | DELAYED_RELEASE_TABLET | Freq: Every day | ORAL | Status: DC
  Administered 2013-04-18 – 2013-04-19 (×2): 325 mg via ORAL
  Filled 2013-04-17 (×2): qty 1

## 2013-04-17 MED ORDER — DOCUSATE SODIUM 100 MG PO CAPS
200.0000 mg | ORAL_CAPSULE | Freq: Every day | ORAL | Status: DC
  Administered 2013-04-18 – 2013-04-19 (×2): 200 mg via ORAL
  Filled 2013-04-17: qty 2
  Filled 2013-04-17: qty 1
  Filled 2013-04-17: qty 2

## 2013-04-17 MED ORDER — FUROSEMIDE 10 MG/ML IJ SOLN
20.0000 mg | Freq: Once | INTRAMUSCULAR | Status: AC
  Administered 2013-04-17: 20 mg via INTRAVENOUS

## 2013-04-17 MED ORDER — ONDANSETRON HCL 4 MG/2ML IJ SOLN: 4.0000 mg | Freq: Four times a day (QID) | INTRAMUSCULAR | Status: DC | PRN

## 2013-04-17 MED ORDER — SODIUM CHLORIDE 0.9 % IJ SOLN: 3.0000 mL | INTRAMUSCULAR | Status: DC | PRN

## 2013-04-17 MED ORDER — FUROSEMIDE 10 MG/ML IJ SOLN
60.0000 mg | Freq: Once | INTRAMUSCULAR | Status: AC
  Administered 2013-04-17: 60 mg via INTRAVENOUS
  Filled 2013-04-17: qty 6

## 2013-04-17 MED ORDER — POLYSACCHARIDE IRON COMPLEX 150 MG PO CAPS
150.0000 mg | ORAL_CAPSULE | Freq: Every day | ORAL | Status: DC
  Administered 2013-04-18 – 2013-04-19 (×2): 150 mg via ORAL
  Filled 2013-04-17 (×2): qty 1

## 2013-04-17 MED ORDER — ACETAMINOPHEN 325 MG PO TABS
650.0000 mg | ORAL_TABLET | ORAL | Status: DC | PRN
  Administered 2013-04-18: 650 mg via ORAL
  Filled 2013-04-17: qty 2

## 2013-04-17 MED ORDER — STARCH (THICKENING) PO POWD
1.0000 g | ORAL | Status: DC | PRN
  Filled 2013-04-17: qty 227

## 2013-04-17 MED ORDER — FUROSEMIDE 10 MG/ML IJ SOLN
INTRAMUSCULAR | Status: AC
  Filled 2013-04-17: qty 2

## 2013-04-17 MED ORDER — FUROSEMIDE 10 MG/ML IJ SOLN
80.0000 mg | Freq: Two times a day (BID) | INTRAMUSCULAR | Status: DC
  Administered 2013-04-18 – 2013-04-19 (×3): 80 mg via INTRAVENOUS
  Filled 2013-04-17 (×3): qty 8

## 2013-04-17 MED ORDER — ALBUTEROL SULFATE (5 MG/ML) 0.5% IN NEBU
2.5000 mg | INHALATION_SOLUTION | Freq: Once | RESPIRATORY_TRACT | Status: AC
  Administered 2013-04-17: 2.5 mg via RESPIRATORY_TRACT

## 2013-04-17 MED ORDER — SODIUM CHLORIDE 0.9 % IJ SOLN: 3.0000 mL | Freq: Two times a day (BID) | INTRAMUSCULAR | Status: DC

## 2013-04-17 MED ORDER — CARVEDILOL 3.125 MG PO TABS
3.1250 mg | ORAL_TABLET | Freq: Two times a day (BID) | ORAL | Status: DC
  Administered 2013-04-18 – 2013-04-19 (×4): 3.125 mg via ORAL
  Filled 2013-04-17 (×4): qty 1

## 2013-04-17 MED ORDER — IPRATROPIUM BROMIDE 0.02 % IN SOLN
0.5000 mg | Freq: Once | RESPIRATORY_TRACT | Status: AC
  Administered 2013-04-17: 0.5 mg via RESPIRATORY_TRACT
  Filled 2013-04-17: qty 2.5

## 2013-04-17 MED ORDER — ALBUTEROL SULFATE (5 MG/ML) 0.5% IN NEBU
INHALATION_SOLUTION | RESPIRATORY_TRACT | Status: AC
  Filled 2013-04-17: qty 0.5

## 2013-04-17 MED ORDER — SIMVASTATIN 20 MG PO TABS
20.0000 mg | ORAL_TABLET | Freq: Every day | ORAL | Status: DC
  Administered 2013-04-17 – 2013-04-19 (×3): 20 mg via ORAL
  Filled 2013-04-17 (×3): qty 1

## 2013-04-17 MED ORDER — ALBUTEROL SULFATE HFA 108 (90 BASE) MCG/ACT IN AERS: 1.0000 | INHALATION_SPRAY | Freq: Four times a day (QID) | RESPIRATORY_TRACT | Status: DC | PRN

## 2013-04-17 MED ORDER — LATANOPROST 0.005 % OP SOLN
1.0000 [drp] | Freq: Every day | OPHTHALMIC | Status: DC
  Administered 2013-04-18: 1 [drp] via OPHTHALMIC
  Filled 2013-04-17: qty 2.5

## 2013-04-17 MED ORDER — SODIUM CHLORIDE 0.9 % IV SOLN
250.0000 mL | Freq: Once | INTRAVENOUS | Status: AC
  Administered 2013-04-17: 250 mL via INTRAVENOUS

## 2013-04-17 NOTE — ED Provider Notes (Signed)
History     CSN: [id number]  Arrival date & time 04/04/13  1328   First MD Initiated Contact with Patient 04/04/13 1353      Chief Complaint  Patient presents with  . Shortness of Breath    (Consider location/radiation/quality/duration/timing/severity/associated sxs/prior treatment) Patient is a 79 y.o. male presenting with shortness of breath. The history is provided by the patient (the pt complains of sob).  Shortness of Breath Severity:  Moderate Onset quality:  Gradual Timing:  Constant Progression:  Worsening Chronicity:  Recurrent Context: activity   Associated symptoms: no abdominal pain, no chest pain, no cough, no headaches and no rash     Past Medical History  Diagnosis Date  . Hypertension   . Arthritis   . Stroke   . Malaria   . Urothelial cancer 12/16/2012  . Anemia   . Hyperlipidemia     Past Surgical History  Procedure Laterality Date  . Hernia repair    . Cataract extraction    . Appendectomy    . Eye surgery    . Cystoscopy  12/08/2012    Procedure: CYSTOSCOPY FLEXIBLE;  Surgeon: Ky Barban, MD;  Location: [facility];  Service: Urology;  Laterality: N/A;  . Transurethral resection of bladder tumor  12/09/2012    Procedure: TRANSURETHRAL RESECTION OF BLADDER TUMOR (TURBT);  Surgeon: Ky Barban, MD;  Location: [facility];  Service: Urology;  Laterality: N/A;    Family History  Problem Relation Age of Onset  . Diabetes Mother   . Diabetes Father   . Cancer Sister     breast  . Cancer Sister     thyroid  . Cancer Brother     leukemia  . Cancer Brother   . Coronary artery disease Brother     History  Substance Use Topics  . Smoking status: Former Games developer  . Smokeless tobacco: Not on file  . Alcohol Use: No      Review of Systems  Constitutional: Negative for appetite change and fatigue.  HENT: Negative for congestion, sinus pressure and ear discharge.   Eyes: Negative for discharge.  Respiratory: Positive for shortness of  breath. Negative for cough.   Cardiovascular: Negative for chest pain.  Gastrointestinal: Negative for abdominal pain and diarrhea.  Genitourinary: Negative for frequency and hematuria.  Musculoskeletal: Negative for back pain.  Skin: Negative for rash.  Neurological: Negative for seizures and headaches.  Psychiatric/Behavioral: Negative for hallucinations.    Allergies  Shellfish allergy  Home Medications   Current Outpatient Rx  Name  Route  Sig  Dispense  Refill  . acetaminophen (ARTHRITIS PAIN RELIEVER) 650 MG CR tablet   Oral   Take 650 mg by mouth daily as needed. For pain         . albuterol (PROVENTIL HFA;VENTOLIN HFA) 108 (90 BASE) MCG/ACT inhaler   Inhalation   Inhale 1-2 puffs into the lungs every 6 (six) hours as needed for wheezing.   1 Inhaler   0   . amLODipine (NORVASC) 10 MG tablet   Oral   Take 1 tablet (10 mg total) by mouth daily.   30 tablet   0   . aspirin EC 325 MG tablet   Oral   Take 1 tablet (325 mg total) by mouth daily.   30 tablet   0   . docusate sodium 100 MG CAPS   Oral   Take 200 mg by mouth daily.   10 capsule      . food  thickener (THICK IT) POWD   Oral   Take 1 g by mouth as needed (thicken liquids).         . iron polysaccharides (NIFEREX) 150 MG capsule   Oral   Take 1 capsule (150 mg total) by mouth daily.   30 capsule   1   . latanoprost (XALATAN) 0.005 % ophthalmic solution   Both Eyes   Place 1 drop into both eyes at bedtime.         Marland Kitchen levothyroxine (SYNTHROID, LEVOTHROID) 100 MCG tablet   Oral   Take 100 mcg by mouth daily before breakfast.         . LORazepam (ATIVAN) 0.5 MG tablet   Oral   Take 0.5 tablets (0.25 mg total) by mouth every 6 (six) hours as needed for anxiety.   30 tablet   0   . simvastatin (ZOCOR) 20 MG tablet   Oral   Take 20 mg by mouth every morning.          . terazosin (HYTRIN) 1 MG capsule   Oral   Take 1 mg by mouth at bedtime.         Marland Kitchen azithromycin (ZITHROMAX)  250 MG tablet   Oral   Take 1 tablet (250 mg total) by mouth daily. Take first 2 tablets together, then 1 every day until finished.   6 tablet   0   . carvedilol (COREG) 3.125 MG tablet   Oral   Take 1 tablet (3.125 mg total) by mouth 2 (two) times daily with a meal.   30 tablet   0   . furosemide (LASIX) 20 MG tablet   Oral   Take 2 tablets (40 mg total) by mouth daily.   20 tablet   0     BP 148/60  Pulse 72  Temp(Src) 97.8 F (36.6 C) (Oral)  Resp 16  Ht 5\' 8"  (1.727 m)  Wt 151 lb 14.4 oz (68.9 kg)  BMI 23.1 kg/m2  SpO2 96%  Physical Exam  Constitutional: He is oriented to person, place, and time. He appears well-developed.  HENT:  Head: Normocephalic.  Eyes: Conjunctivae and EOM are normal. No scleral icterus.  Neck: Neck supple. No thyromegaly present.  Cardiovascular: Normal rate and regular rhythm.  Exam reveals no gallop and no friction rub.   No murmur heard. Pulmonary/Chest: No stridor. He has wheezes. He has no rales. He exhibits no tenderness.  Abdominal: He exhibits no distension. There is no tenderness. There is no rebound.  Musculoskeletal: Normal range of motion. He exhibits no edema.  Lymphadenopathy:    He has no cervical adenopathy.  Neurological: He is oriented to person, place, and time. Coordination normal.  Skin: No rash noted. No erythema.  Psychiatric: He has a normal mood and affect. His behavior is normal.    ED Course  Procedures (including critical care time)  Labs Reviewed  BASIC METABOLIC PANEL - Abnormal; Notable for the following:    Glucose, Bld 105 (*)    BUN 49 (*)    Creatinine, Ser 1.88 (*)    GFR calc non Af Amer 30 (*)    GFR calc Af Amer 34 (*)    All other components within normal limits  CBC WITH DIFFERENTIAL - Abnormal; Notable for the following:    WBC 11.6 (*)    RBC 2.83 (*)    Hemoglobin 8.3 (*)    HCT 25.0 (*)    RDW 17.4 (*)    Platelets  528 (*)    Neutro Abs 8.4 (*)    Monocytes Absolute 1.4 (*)     All other components within normal limits  PRO B NATRIURETIC PEPTIDE - Abnormal; Notable for the following:    Pro B Natriuretic peptide (BNP) 13186.0 (*)    All other components within normal limits  URINALYSIS, ROUTINE W REFLEX MICROSCOPIC - Abnormal; Notable for the following:    APPearance HAZY (*)    Hgb urine dipstick MODERATE (*)    Protein, ur 100 (*)    Leukocytes, UA MODERATE (*)    All other components within normal limits  URINE MICROSCOPIC-ADD ON - Abnormal; Notable for the following:    Bacteria, UA FEW (*)    All other components within normal limits  BASIC METABOLIC PANEL - Abnormal; Notable for the following:    BUN 45 (*)    Creatinine, Ser 1.79 (*)    GFR calc non Af Amer 31 (*)    GFR calc Af Amer 36 (*)    All other components within normal limits  BASIC METABOLIC PANEL - Abnormal; Notable for the following:    BUN 48 (*)    Creatinine, Ser 2.09 (*)    GFR calc non Af Amer 26 (*)    GFR calc Af Amer 30 (*)    All other components within normal limits  CBC - Abnormal; Notable for the following:    WBC 11.9 (*)    RBC 2.94 (*)    Hemoglobin 8.5 (*)    HCT 25.9 (*)    RDW 17.5 (*)    Platelets 537 (*)    All other components within normal limits  URINE CULTURE  MRSA PCR SCREENING   Dg Chest Port 1 View  04/16/2013   *RADIOLOGY REPORT*  Clinical Data: Shortness of breath.  PORTABLE CHEST - 1 VIEW  Comparison: 04/12/2013.  Findings: The cardiac silhouette, mediastinal and hilar contours are stable.  There is tortuosity and calcification of the thoracic aorta.  Slight worsening lung aeration likely a combination of edema and atelectasis.  Small pleural effusions are suspected.  IMPRESSION: Slight worsening lung aeration likely due to mild edema and atelectasis.   Original Report Authenticated By: Rudie Meyer, M.D.     1. Bronchitis   2. Bronchospasm   3. Acute diastolic heart failure   4. Anemia   5. CKD (chronic kidney disease) stage 3, GFR 30-59 ml/min    6. Pleural effusion   7. Hypothyroidism   8. Acute renal failure   9. Hypertension   10. SOB (shortness of breath)   11. Hydronephrosis   12. Dehydration   13. Hyperkalemia   14. UTI (lower urinary tract infection)   15. Bladder mass   16. Atelectasis of both lungs   17. Pneumonia   18. Bladder cancer   19. Acute blood loss anemia   20. Left arm cellulitis   21. Atelectasis   22. CVA (cerebral vascular accident)       MDM          Benny Lennert, MD 04/17/13 [id number]

## 2013-04-17 NOTE — ED Notes (Signed)
Pt to have PCXR due to unable to stand per family

## 2013-04-17 NOTE — ED Provider Notes (Signed)
History    This chart was scribed for Toy Baker, MD by Donne Anon, ED Scribe. This patient was seen in room [facility] and the patient's care was started at 1721.   CSN: [id number]  Arrival date & time 04/17/13  1705   First MD Initiated Contact with Patient 04/17/13 1721      Chief Complaint  Patient presents with  . Shortness of Breath     The history is provided by the patient and a relative. No language interpreter was used.   HPI Comments: Reginald Waller is a 79 y.o. male with hx of HTN, HLD and urothelial cancer, who presents to the Emergency Department complaining of SOB. He was sent here for evaluation from the cancer center, where he received 2 units of blood today. He also received a breathing treatment at the cancer center which provided relief, but the SOB returned when he exerted himself to leave the center.  He states coughing and clearing the congestion out of his chest makes it easier to breathe. His niece denies fever or any other pain.   Past Medical History  Diagnosis Date  . Hypertension   . Arthritis   . Stroke   . Malaria   . Urothelial cancer 12/16/2012  . Anemia   . Hyperlipidemia     Past Surgical History  Procedure Laterality Date  . Hernia repair    . Cataract extraction    . Appendectomy    . Eye surgery    . Cystoscopy  12/08/2012    Procedure: CYSTOSCOPY FLEXIBLE;  Surgeon: Ky Barban, MD;  Location: [facility];  Service: Urology;  Laterality: N/A;  . Transurethral resection of bladder tumor  12/09/2012    Procedure: TRANSURETHRAL RESECTION OF BLADDER TUMOR (TURBT);  Surgeon: Ky Barban, MD;  Location: [facility];  Service: Urology;  Laterality: N/A;    Family History  Problem Relation Age of Onset  . Diabetes Mother   . Diabetes Father   . Cancer Sister     breast  . Cancer Sister     thyroid  . Cancer Brother     leukemia  . Cancer Brother   . Coronary artery disease Brother     History  Substance Use Topics  .  Smoking status: Former Games developer  . Smokeless tobacco: Not on file  . Alcohol Use: No      Review of Systems  Constitutional: Negative for fatigue.  Respiratory: Positive for shortness of breath.   All other systems reviewed and are negative.    Allergies  Shellfish allergy  Home Medications   Current Outpatient Rx  Name  Route  Sig  Dispense  Refill  . acetaminophen (ARTHRITIS PAIN RELIEVER) 650 MG CR tablet   Oral   Take 650 mg by mouth daily as needed. For pain         . albuterol (PROVENTIL HFA;VENTOLIN HFA) 108 (90 BASE) MCG/ACT inhaler   Inhalation   Inhale 1-2 puffs into the lungs every 6 (six) hours as needed for wheezing.   1 Inhaler   0   . amLODipine (NORVASC) 10 MG tablet   Oral   Take 1 tablet (10 mg total) by mouth daily.   30 tablet   0   . aspirin EC 325 MG tablet   Oral   Take 1 tablet (325 mg total) by mouth daily.   30 tablet   0   . azithromycin (ZITHROMAX) 250 MG tablet   Oral  Take 1 tablet (250 mg total) by mouth daily. Take first 2 tablets together, then 1 every day until finished.   6 tablet   0   . carvedilol (COREG) 3.125 MG tablet   Oral   Take 1 tablet (3.125 mg total) by mouth 2 (two) times daily with a meal.   30 tablet   0   . docusate sodium 100 MG CAPS   Oral   Take 200 mg by mouth daily.   10 capsule      . food thickener (THICK IT) POWD   Oral   Take 1 g by mouth as needed (thicken liquids).         . furosemide (LASIX) 20 MG tablet   Oral   Take 2 tablets (40 mg total) by mouth daily.   20 tablet   0   . iron polysaccharides (NIFEREX) 150 MG capsule   Oral   Take 1 capsule (150 mg total) by mouth daily.   30 capsule   1   . latanoprost (XALATAN) 0.005 % ophthalmic solution   Both Eyes   Place 1 drop into both eyes at bedtime.         Marland Kitchen levothyroxine (SYNTHROID, LEVOTHROID) 100 MCG tablet   Oral   Take 100 mcg by mouth daily before breakfast.         . LORazepam (ATIVAN) 0.5 MG tablet    Oral   Take 0.5 tablets (0.25 mg total) by mouth every 6 (six) hours as needed for anxiety.   30 tablet   0   . simvastatin (ZOCOR) 20 MG tablet   Oral   Take 20 mg by mouth every morning.          . terazosin (HYTRIN) 1 MG capsule   Oral   Take 1 mg by mouth at bedtime.           BP 137/97  Pulse 80  Temp(Src) 97.1 F (36.2 C)  Resp 28  SpO2 95%  Physical Exam  Nursing note and vitals reviewed. Constitutional: He is oriented to person, place, and time. He appears well-developed and well-nourished. He appears distressed (mild to moderate).  HENT:  Head: Normocephalic and atraumatic.  Eyes: EOM are normal.  Neck: Neck supple. No tracheal deviation present.  Cardiovascular: Normal rate.   Pulmonary/Chest: Effort normal. No respiratory distress. He has wheezes (expiratory  ). He has rhonchi.     Musculoskeletal: Normal range of motion.  1+ edema bilateral lower extremities    Neurological: He is alert and oriented to person, place, and time.  Skin: Skin is warm and dry.  Psychiatric: He has a normal mood and affect. His behavior is normal.    ED Course  Procedures (including critical care time) DIAGNOSTIC STUDIES: Oxygen Saturation is 95% on room air, adequate by my interpretation.    COORDINATION OF CARE: 5:22 PM Discussed treatment plan which includes CXR, labs and a breathing treatment with pt at bedside and pt agreed to plan.   7:47 PM Rechecked pt. Informed of CHF results. Will admit pt.   Labs Reviewed - No data to display Dg Chest [facility]  04/16/2013   *RADIOLOGY REPORT*  Clinical Data: Shortness of breath.  PORTABLE CHEST - 1 VIEW  Comparison: 04/12/2013.  Findings: The cardiac silhouette, mediastinal and hilar contours are stable.  There is tortuosity and calcification of the thoracic aorta.  Slight worsening lung aeration likely a combination of edema and atelectasis.  Small pleural effusions are  suspected.  IMPRESSION: Slight worsening lung  aeration likely due to mild edema and atelectasis.   Original Report Authenticated By: Rudie Meyer, M.D.     No diagnosis found.    MDM  Patient given albuterol treatment as well as Lasix. His chest x-ray consistent with CHF. He does have bronchospasm as well. He will be admitted to [facility] hospitalist  CRITICAL CARE Performed by: Toy Baker Total critical care time: 35 Critical care time was exclusive of separately billable procedures and treating other patients. Critical care was necessary to treat or prevent imminent or life-threatening deterioration. Critical care was time spent personally by me on the following activities: development of treatment plan with patient and/or surrogate as well as nursing, discussions with consultants, evaluation of patient's response to treatment, examination of patient, obtaining history from patient or surrogate, ordering and performing treatments and interventions, ordering and review of laboratory studies, ordering and review of radiographic studies, pulse oximetry and re-evaluation of patient's condition.    I personally performed the services described in this documentation, which was scribed in my presence. The recorded information has been reviewed and is accurate.       Toy Baker, MD 04/17/13 2025

## 2013-04-17 NOTE — ED Notes (Signed)
Expiratory wheezing which seems a little better after initial breathing tx. NAD.

## 2013-04-17 NOTE — Progress Notes (Signed)
1100 Patient c/o difficulty breathing. Wheezing noted slightly worse than baseline. Blood stopped and lasix given. Respiratory called for assessment and nebulizer treatment. 1120  Talked with Dr. Regino Schultze and orders received to repeat lasix.  1136 patient resting more comfortably. 1230 no acute distress. 1342 resting well. 1630 c/o "can't get my breath again" Set patient up on side of bed, he coughed up moderate sized amt. Of green thick sputum with some relief in distress. 1655 d/c to car with niece and assisted into car. Patient states again "i can't breathe". Niece and patient wish to go to ED and she chooses to drive him around to ED entrance. Report called to [facility] in ED.

## 2013-04-17 NOTE — H&P (Signed)
[facility] History and Physical  Reginald Waller:[id number] DOB: Apr 21, 1919 DOA: 04/17/2013   PCP: Kirk Ruths, MD   Chief Complaint: Shortness of breath for the last many days  HPI: Reginald Waller is a 79 y.o. male with a past with history of diastolic heart failure, chronic kidney disease, bladder cancer with a chronic indwelling Foley who has been treated for bronchitis recently and was noted to be severely anemic 2 days ago. Transfusion was arranged at the cancer Center today. While receiving transfusion patient got progressively more short of breath. He was given Lasix however, he did not feel any better and so, he was brought into the ED. Patient is a poor historian. No family is available at bedside. I was told that his neice just left. He has been having congestion. Has been coughing up some orange-colored phlegm. Has been taking nebulizer treatments at home and, in the ED, without any relief. Denies any chest pain. No nausea, vomiting. He was given Lasix in the ER and in the infusion clinic, with no relief. Patient is a very poor historian. He gets distracted, quite easily. So history is very limited.  Home Medications: Prior to Admission medications   Medication Sig Start Date End Date Taking? Authorizing Provider  acetaminophen (ARTHRITIS PAIN RELIEVER) 650 MG CR tablet Take 650 mg by mouth daily as needed. For pain    Historical Provider, MD  albuterol (PROVENTIL HFA;VENTOLIN HFA) 108 (90 BASE) MCG/ACT inhaler Inhale 1-2 puffs into the lungs every 6 (six) hours as needed for wheezing. 03/16/13   Nicoletta Dress. Colon Branch, MD  amLODipine (NORVASC) 10 MG tablet Take 1 tablet (10 mg total) by mouth daily. 12/11/12   Stephani Police, PA-C  aspirin EC 325 MG tablet Take 1 tablet (325 mg total) by mouth daily. 12/19/12   Tora Kindred York, PA-C  azithromycin (ZITHROMAX) 250 MG tablet Take 1 tablet (250 mg total) by mouth daily. Take first 2 tablets together, then 1 every day until finished.  04/12/13   Geoffery Lyons, MD  carvedilol (COREG) 3.125 MG tablet Take 1 tablet (3.125 mg total) by mouth 2 (two) times daily with a meal. 04/06/13   Gwenyth Bender, NP  docusate sodium 100 MG CAPS Take 200 mg by mouth daily. 12/19/12   Stephani Police, PA-C  food thickener (THICK IT) POWD Take 1 g by mouth as needed (thicken liquids). 12/19/12   Tora Kindred York, PA-C  furosemide (LASIX) 20 MG tablet Take 2 tablets (40 mg total) by mouth daily. 04/06/13   Leroy Sea, MD  iron polysaccharides (NIFEREX) 150 MG capsule Take 1 capsule (150 mg total) by mouth daily. 12/19/12   Tora Kindred York, PA-C  latanoprost (XALATAN) 0.005 % ophthalmic solution Place 1 drop into both eyes at bedtime.    Historical Provider, MD  levothyroxine (SYNTHROID, LEVOTHROID) 100 MCG tablet Take 100 mcg by mouth daily before breakfast.    Historical Provider, MD  LORazepam (ATIVAN) 0.5 MG tablet Take 0.5 tablets (0.25 mg total) by mouth every 6 (six) hours as needed for anxiety. 12/19/12   Stephani Police, PA-C  nitrofurantoin, macrocrystal-monohydrate, (MACROBID) 100 MG capsule  04/13/13   Historical Provider, MD  simvastatin (ZOCOR) 20 MG tablet Take 20 mg by mouth every morning.     Historical Provider, MD  terazosin (HYTRIN) 1 MG capsule Take 1 mg by mouth at bedtime.    Historical Provider, MD    Allergies:  Allergies  Allergen Reactions  . Shellfish Allergy  Other (See Comments)    Swelling of throat and tongue.    Past Medical History: Past Medical History  Diagnosis Date  . Hypertension   . Arthritis   . Stroke   . Malaria   . Urothelial cancer 12/16/2012  . Anemia   . Hyperlipidemia     Past Surgical History  Procedure Laterality Date  . Hernia repair    . Cataract extraction    . Appendectomy    . Eye surgery    . Cystoscopy  12/08/2012    Procedure: CYSTOSCOPY FLEXIBLE;  Surgeon: Ky Barban, MD;  Location: [facility];  Service: Urology;  Laterality: N/A;  . Transurethral resection of bladder tumor   12/09/2012    Procedure: TRANSURETHRAL RESECTION OF BLADDER TUMOR (TURBT);  Surgeon: Ky Barban, MD;  Location: [facility];  Service: Urology;  Laterality: N/A;    Social History:  reports that he has quit smoking. He does not have any smokeless tobacco history on file. He reports that he does not drink alcohol or use illicit drugs.  Living Situation: He lives in [place] [facility] Activity Level: Activity level is unknown   Family History:  Family History  Problem Relation Age of Onset  . Diabetes Mother   . Diabetes Father   . Cancer Sister     breast  . Cancer Sister     thyroid  . Cancer Brother     leukemia  . Cancer Brother   . Coronary artery disease Brother      Review of Systems - Unable to do due as patient is easily distracted.  Physical Examination  Filed Vitals:   04/17/13 1709 04/17/13 1754 04/17/13 1848 04/17/13 2030  BP: 137/97  171/59 165/50  Pulse: 80  82   Temp: 97.1 F (36.2 C)     Resp: 28  24 20   SpO2: 95% 96% 96% 99%    General appearance: alert, distracted and no distress Head: Normocephalic, without obvious abnormality, atraumatic Eyes: conjunctivae/corneas clear. PERRL, EOM's intact. Neck: no adenopathy, no carotid bruit, no JVD, supple, symmetrical, trachea midline and thyroid not enlarged, symmetric, no tenderness/mass/nodules Resp: Few wheezes bilaterally. few crackles at the bases. Cardio: regular rate and rhythm, S1, S2 normal, no murmur, click, rub or gallop GI: soft, non-tender; bowel sounds normal; no masses,  no organomegaly Extremities: extremities normal, atraumatic, no cyanosis or edema Pulses: 2+ and symmetric Skin: Skin color, texture, turgor normal. No rashes or lesions Lymph nodes: Cervical, supraclavicular, and axillary nodes normal. Neurologic: He is alert. No focal deficits are appreciated. Reflexes are equal, bilaterally  Laboratory Data: Results for orders placed during the hospital encounter of 04/17/13 (from the past  48 hour(s))  PRO B NATRIURETIC PEPTIDE     Status: Abnormal   Collection Time    04/17/13  5:48 PM      Result Value Range   Pro B Natriuretic peptide (BNP) 14323.0 (*) 0 - 450 pg/mL  CBC WITH DIFFERENTIAL     Status: Abnormal   Collection Time    04/17/13  5:48 PM      Result Value Range   WBC 13.4 (*) 4.0 - 10.5 K/uL   RBC 3.75 (*) 4.22 - 5.81 MIL/uL   Hemoglobin 10.7 (*) 13.0 - 17.0 g/dL   Comment: POST TRANSFUSION SPECIMEN   HCT 32.4 (*) 39.0 - 52.0 %   MCV 86.4  78.0 - 100.0 fL   MCH 28.5  26.0 - 34.0 pg   MCHC 33.0  30.0 -  36.0 g/dL   RDW 16.1 (*) 09.6 - 04.5 %   Platelets 633 (*) 150 - 400 K/uL   Neutrophils Relative % 78 (*) 43 - 77 %   Neutro Abs 10.4 (*) 1.7 - 7.7 K/uL   Lymphocytes Relative 9 (*) 12 - 46 %   Lymphs Abs 1.3  0.7 - 4.0 K/uL   Monocytes Relative 12  3 - 12 %   Monocytes Absolute 1.6 (*) 0.1 - 1.0 K/uL   Eosinophils Relative 1  0 - 5 %   Eosinophils Absolute 0.1  0.0 - 0.7 K/uL   Basophils Relative 0  0 - 1 %   Basophils Absolute 0.1  0.0 - 0.1 K/uL  COMPREHENSIVE METABOLIC PANEL     Status: Abnormal   Collection Time    04/17/13  5:48 PM      Result Value Range   Sodium 139  135 - 145 mEq/L   Potassium 4.2  3.5 - 5.1 mEq/L   Chloride 99  96 - 112 mEq/L   CO2 26  19 - 32 mEq/L   Glucose, Bld 117 (*) 70 - 99 mg/dL   BUN 48 (*) 6 - 23 mg/dL   Creatinine, Ser 4.09 (*) 0.50 - 1.35 mg/dL   Calcium 9.1  8.4 - 81.1 mg/dL   Total Protein 7.3  6.0 - 8.3 g/dL   Albumin 3.0 (*) 3.5 - 5.2 g/dL   AST 24  0 - 37 U/L   ALT 17  0 - 53 U/L   Alkaline Phosphatase 93  39 - 117 U/L   Total Bilirubin 0.7  0.3 - 1.2 mg/dL   GFR calc non Af Amer 30 (*) >90 mL/min   GFR calc Af Amer 35 (*) >90 mL/min   Comment:            The eGFR has been calculated     using the CKD EPI equation.     This calculation has not been     validated in all clinical     situations.     eGFR's persistently     <90 mL/min signify     possible Chronic Kidney Disease.  POCT I-STAT  TROPONIN I     Status: None   Collection Time    04/17/13  6:18 PM      Result Value Range   Troponin i, poc 0.05  0.00 - 0.08 ng/mL   Comment 3            Comment: Due to the release kinetics of cTnI,     a negative result within the first hours     of the onset of symptoms does not rule out     myocardial infarction with certainty.     If myocardial infarction is still suspected,     repeat the test at appropriate intervals.    Radiology Reports: Dg Chest Port 1 View  04/17/2013   *RADIOLOGY REPORT*  Clinical Data: Cough and chest pain  PORTABLE CHEST - 1 VIEW  Comparison: 04/16/2013  Findings: Diffuse bilateral airspace disease is again present. There has been some increased aeration and clearing at the lung bases likely reflecting improving volume loss at the lung bases. Small pleural effusions may be present.  No pneumothorax.  IMPRESSION: Diffuse airspace disease as described.  There is been some clearing at the lung bases likely representing improving volume loss.   Original Report Authenticated By: Jolaine Click, M.D.   Dg Chest [facility]  04/16/2013   *RADIOLOGY REPORT*  Clinical Data: Shortness of breath.  PORTABLE CHEST - 1 VIEW  Comparison: 04/12/2013.  Findings: The cardiac silhouette, mediastinal and hilar contours are stable.  There is tortuosity and calcification of the thoracic aorta.  Slight worsening lung aeration likely a combination of edema and atelectasis.  Small pleural effusions are suspected.  IMPRESSION: Slight worsening lung aeration likely due to mild edema and atelectasis.   Original Report Authenticated By: Rudie Meyer, M.D.    Electrocardiogram: Sinus rhythm at 83 beats per minute. Left axis deviation. There is a subtle ST depression in V5, V6, which appears to be old. No acute ST changes are seen. The EKG is quite similar to previous EKG.  Problem List  Principal Problem:   Acute pulmonary edema Active Problems:   Bladder cancer   Anemia   CKD (chronic  kidney disease) stage 3, GFR 30-59 ml/min   Chronic indwelling foley catheter   Assessment: This is a 79 year old, Caucasian male, who presents with worsening shortness of breath. X-ray suggests pulmonary edema, which has been worsening over the last few days. His BNP is also elevated. This is a probably due to diastolic heart failure made worse by blood transfusion. Doesn't appear to be transfusion-related acute lung injury as this has been ongoing for a few days.  Plan: #1 Pulmonary edema, likely due to acute diastolic heart failure: He is been given 60 mg of Lasix in the ED, and has received previous doses of Lasix in the infusion clinic. Will monitor his ins and outs closely. He. We will give 80 mg of Lasix twice daily from tomorrow morning. Keep a close watch on his renal function. He had an echocardiogram in January, which showed a normal systolic function with a normal EF. He's not candidate for ACE inhibitor due to his kidney disease.  #2 anemia likely due to chronic disease: He required blood transfusion today. His hemoglobin is up to 10 now. Continue to monitor.  #3 chronic kidney disease, stage III: Monitor creatinine, while he is on Lasix. Monitor urine output closely.  #4 history of bladder cancer with chronic indwelling Foley: Continue the Foley catheter. Monitor urine output.   DVT Prophylaxis: Heparin Code Status: He'll be a full code. I tried discussing CODE STATUS with the patient, but he was not able to comprehend. I was unable to reach any family member. Family Communication: Unable to reach family member  Disposition Plan: Admit to telemetry.   Further management decisions will depend on results of further testing and patient's response to treatment.  [facility]  [facility] Pager [phone]  If 7PM-7AM, please contact night-coverage [email or web address] Password [facility]  04/17/2013, 9:06 PM

## 2013-04-17 NOTE — ED Notes (Signed)
Pt received blood transfusion x 2 units today at specialty clinic. Sent down by nurse due to SOB. Pt states he is always sob but today may be a little worse.

## 2013-04-17 NOTE — ED Notes (Signed)
Sent for evaluation os shortness of breath from the cancer center, received 2 units of blood today

## 2013-04-18 ENCOUNTER — Other Ambulatory Visit: Payer: Self-pay

## 2013-04-18 ENCOUNTER — Encounter (HOSPITAL_COMMUNITY): Payer: Self-pay | Admitting: *Deleted

## 2013-04-18 ENCOUNTER — Inpatient Hospital Stay (HOSPITAL_COMMUNITY): Payer: Medicare Other

## 2013-04-18 DIAGNOSIS — J810 Acute pulmonary edema: Secondary | ICD-10-CM

## 2013-04-18 DIAGNOSIS — I509 Heart failure, unspecified: Secondary | ICD-10-CM

## 2013-04-18 LAB — CBC
HCT: 30.2 % — ABNORMAL LOW (ref 39.0–52.0)
Hemoglobin: 10 g/dL — ABNORMAL LOW (ref 13.0–17.0)
MCH: 28.5 pg (ref 26.0–34.0)
MCHC: 33.1 g/dL (ref 30.0–36.0)
MCV: 86 fL (ref 78.0–100.0)
Platelets: 547 10*3/uL — ABNORMAL HIGH (ref 150–400)
RBC: 3.51 MIL/uL — ABNORMAL LOW (ref 4.22–5.81)
RDW: 15.9 % — ABNORMAL HIGH (ref 11.5–15.5)
WBC: 12.5 10*3/uL — ABNORMAL HIGH (ref 4.0–10.5)

## 2013-04-18 LAB — COMPREHENSIVE METABOLIC PANEL WITH GFR
ALT: 14 U/L (ref 0–53)
AST: 20 U/L (ref 0–37)
Albumin: 2.7 g/dL — ABNORMAL LOW (ref 3.5–5.2)
Alkaline Phosphatase: 85 U/L (ref 39–117)
BUN: 46 mg/dL — ABNORMAL HIGH (ref 6–23)
CO2: 26 meq/L (ref 19–32)
Calcium: 8.8 mg/dL (ref 8.4–10.5)
Chloride: 99 meq/L (ref 96–112)
Creatinine, Ser: 1.89 mg/dL — ABNORMAL HIGH (ref 0.50–1.35)
GFR calc Af Amer: 34 mL/min — ABNORMAL LOW
GFR calc non Af Amer: 29 mL/min — ABNORMAL LOW
Glucose, Bld: 106 mg/dL — ABNORMAL HIGH (ref 70–99)
Potassium: 3.8 meq/L (ref 3.5–5.1)
Sodium: 138 meq/L (ref 135–145)
Total Bilirubin: 0.7 mg/dL (ref 0.3–1.2)
Total Protein: 6.6 g/dL (ref 6.0–8.3)

## 2013-04-18 LAB — TYPE AND SCREEN
ABO/RH(D): A POS
Antibody Screen: NEGATIVE
Unit division: 0

## 2013-04-18 LAB — TROPONIN I: Troponin I: <0.3 ng/mL

## 2013-04-18 MED ORDER — LEVOFLOXACIN IN D5W 500 MG/100ML IV SOLN
500.0000 mg | INTRAVENOUS | Status: DC
  Administered 2013-04-18: 500 mg via INTRAVENOUS
  Filled 2013-04-18 (×2): qty 100

## 2013-04-18 MED ORDER — BIOTENE DRY MOUTH MT LIQD
15.0000 mL | Freq: Two times a day (BID) | OROMUCOSAL | Status: DC
  Administered 2013-04-18 – 2013-04-19 (×3): 15 mL via OROMUCOSAL

## 2013-04-18 MED ORDER — ALBUTEROL SULFATE (5 MG/ML) 0.5% IN NEBU
2.5000 mg | INHALATION_SOLUTION | Freq: Four times a day (QID) | RESPIRATORY_TRACT | Status: DC
  Administered 2013-04-18 – 2013-04-19 (×5): 2.5 mg via RESPIRATORY_TRACT
  Filled 2013-04-18 (×5): qty 0.5

## 2013-04-18 MED ORDER — LEVOFLOXACIN IN D5W 500 MG/100ML IV SOLN
INTRAVENOUS | Status: AC
  Filled 2013-04-18: qty 100

## 2013-04-18 MED ORDER — ALBUTEROL SULFATE (5 MG/ML) 0.5% IN NEBU
INHALATION_SOLUTION | RESPIRATORY_TRACT | Status: AC
  Administered 2013-04-18: 2.5 mg via RESPIRATORY_TRACT
  Filled 2013-04-18: qty 0.5

## 2013-04-18 MED ORDER — MORPHINE SULFATE (CONCENTRATE) 10 MG /0.5 ML PO SOLN: 10.0000 mg | ORAL | Status: DC | PRN

## 2013-04-18 NOTE — Progress Notes (Signed)
Nurse contacted [employer].  Spoke with, Boneta Lucks, [employer].  Asked when patient's foley catheter was last changed.  Boneta Lucks stated that patient's foley was changed at his appointment last week on 04/08/2013.

## 2013-04-18 NOTE — Progress Notes (Signed)
     Subjective: This man came in yesterday with congestive heart failure, he appears to improve today but still appears to be short of breath.           Physical Exam: Blood pressure 131/67, pulse 78, temperature 98.3 F (36.8 C), temperature source Oral, resp. rate 20, height 5\' 8"  (1.727 m), weight 67.8 kg (149 lb 7.6 oz), SpO2 99.00%. He looks chronically sick. However there is no significant respiratory distress. Lung fields are clinically clear except for some occasional wheezing. I cannot any crackles. Heart sounds are present and normal. He seems to be alert and orientated.   Investigations:     Basic Metabolic Panel:  Recent Labs  16/10/96 1748 04/18/13 0332  NA 139 138  K 4.2 3.8  CL 99 99  CO2 26 26  GLUCOSE 117* 106*  BUN 48* 46*  CREATININE 1.84* 1.89*  CALCIUM 9.1 8.8   Liver Function Tests:  Recent Labs  04/17/13 1748 04/18/13 0332  AST 24 20  ALT 17 14  ALKPHOS 93 85  BILITOT 0.7 0.7  PROT 7.3 6.6  ALBUMIN 3.0* 2.7*     CBC:  Recent Labs  04/17/13 1748 04/18/13 0332  WBC 13.4* 12.5*  NEUTROABS 10.4*  --   HGB 10.7* 10.0*  HCT 32.4* 30.2*  MCV 86.4 86.0  PLT 633* 547*    Dg Chest Port 1 View  04/17/2013   *RADIOLOGY REPORT*  Clinical Data: Cough and chest pain  PORTABLE CHEST - 1 VIEW  Comparison: 04/16/2013  Findings: Diffuse bilateral airspace disease is again present. There has been some increased aeration and clearing at the lung bases likely reflecting improving volume loss at the lung bases. Small pleural effusions may be present.  No pneumothorax.  IMPRESSION: Diffuse airspace disease as described.  There is been some clearing at the lung bases likely representing improving volume loss.   Original Report Authenticated By: Jolaine Click, M.D.      Medications: I have reviewed the patient's current medications.  Impression: 1. Acute pulmonary edema secondary to congestive heart failure, clinically improving. 2. History  of bladder cancer. 3. Chronic end disease stage III. 4. Anemia secondary to #3. 5. Chronic indwelling Foley catheter.     Plan: 1. Continue diuresis for today. 2. Repeat chest x-ray. 3. Possible discharge back to the [place] [facility] tomorrow     LOS: 1 day   Wilson Singer Pager [phone]  04/18/2013, 10:19 AM

## 2013-04-18 NOTE — Progress Notes (Signed)
Central Telemetry called and notified RN that pt had a 4 beat run of VTach.  Pt now in NSR.  Pt resting in bed with eyes closed.  Dr. Karilyn Cota notified via text page.

## 2013-04-18 NOTE — Plan of Care (Signed)
Problem: Phase I Progression Outcomes Goal: Initial discharge plan identified Outcome: Completed/Met Date Met:  04/18/13 Return to [facility] at discharge.

## 2013-04-19 ENCOUNTER — Inpatient Hospital Stay (HOSPITAL_COMMUNITY): Payer: Medicare Other

## 2013-04-19 DIAGNOSIS — N179 Acute kidney failure, unspecified: Secondary | ICD-10-CM

## 2013-04-19 DIAGNOSIS — C679 Malignant neoplasm of bladder, unspecified: Secondary | ICD-10-CM

## 2013-04-19 DIAGNOSIS — J189 Pneumonia, unspecified organism: Secondary | ICD-10-CM

## 2013-04-19 LAB — BASIC METABOLIC PANEL
CO2: 30 mEq/L (ref 19–32)
Calcium: 8.7 mg/dL (ref 8.4–10.5)
Creatinine, Ser: 2.08 mg/dL — ABNORMAL HIGH (ref 0.50–1.35)
Glucose, Bld: 95 mg/dL (ref 70–99)
Sodium: 139 mEq/L (ref 135–145)

## 2013-04-19 MED ORDER — LEVOFLOXACIN IN D5W 250 MG/50ML IV SOLN
250.0000 mg | INTRAVENOUS | Status: DC
  Administered 2013-04-19: 250 mg via INTRAVENOUS
  Filled 2013-04-19: qty 50

## 2013-04-19 MED ORDER — LEVOFLOXACIN 250 MG PO TABS: 250.0000 mg | ORAL_TABLET | Freq: Every day | ORAL | Status: AC

## 2013-04-19 MED ORDER — FUROSEMIDE 40 MG PO TABS: 40.0000 mg | ORAL_TABLET | Freq: Two times a day (BID) | ORAL | Status: DC

## 2013-04-19 MED ORDER — MORPHINE SULFATE (CONCENTRATE) 10 MG /0.5 ML PO SOLN: 10.0000 mg | ORAL | Status: DC | PRN

## 2013-04-19 NOTE — Progress Notes (Signed)
Follow patient discharge summary for medications.  Per Dr. Karilyn Cota patient to continue nectar thick liquids at discharge.  Patient's O2 sats on room air 94% after being off oxygen for 2 hours.

## 2013-04-19 NOTE — Progress Notes (Signed)
Late entry for 1220 - Pt being discharged back to [facility] today.  Pt needs FL2.  Spoke to SW at [facility]  Stated that she would prepare an FL2 and call me when it is ready.  Called [id number] and spoke to [place].  Stated that they would be able to take patient back today.  Informed her that SW was preparing the FL2 from [facility].   1437 - Faxed discharge summary to [facility] at [facility].  Called Kayla at [facility] and informed her that the SW was still working on the [employer].  Will call her and fax when ready.  Family to provide transportation.

## 2013-04-19 NOTE — Progress Notes (Signed)
Signed FL2 faxed to [facility] at [facility].

## 2013-04-19 NOTE — Progress Notes (Signed)
Hospice Referral placed.  Pt's nieces stated their preference is [facility].  On-call number for [facility] called for referral.

## 2013-04-19 NOTE — Progress Notes (Addendum)
Pt's IV removed.  Site WNL.  Pt's catheter in place at time of discharge.  Pt to follow-up with Dr. Jerre Simon regarding foley cathter.  Pt's foley catheter bag changed to leg bag.  Pt's niece provided with discharge packet for [facility].  Pt transported by NT via w/c to main entrance for discharge.  Pt stable at time of discharge.  Patient's niece providing transportation to [employer].

## 2013-04-19 NOTE — Discharge Summary (Signed)
Physician Discharge Summary  Reginald Waller QMV:[id number] DOB: [birth year] DOA: 04/17/2013  PCP: Kirk Ruths, MD  Admit date: 04/17/2013 Discharge date: 04/19/2013  Time spent: Greater than 30 minutes   Discharge Diagnoses:  1. Acute congestive heart failure, diastolic, clinically improved. 2. Healthcare-acquired pneumonia. Improving. 3. Bladder cancer. 4. Acute on chronic kidney disease. 5. Chronic indwelling Foley catheter. 6. Palliative performance score of 40%, prognosis likely less than 6 months based on multiple medical problems. Hospice eligible.   Discharge Condition: Stable.  Diet recommendation: Regular, as tolerated.  Filed Weights   04/17/13 2134 04/17/13 2150 04/19/13 0445  Weight: 67.8 kg (149 lb 7.6 oz) 67.8 kg (149 lb 7.6 oz) 65.944 kg (145 lb 6.1 oz)    History of present illness:  This 79 year old man presents to the hospital once again with symptoms of dyspnea for several days. Please see initial history as outlined below: HPI: Reginald Waller is a 79 y.o. male with a past with history of diastolic heart failure, chronic kidney disease, bladder cancer with a chronic indwelling Foley who has been treated for bronchitis recently and was noted to be severely anemic 2 days ago. Transfusion was arranged at the cancer Center today. While receiving transfusion patient got progressively more short of breath. He was given Lasix however, he did not feel any better and so, he was brought into the ED. Patient is a poor historian. No family is available at bedside. I was told that his neice just left. He has been having congestion. Has been coughing up some orange-colored phlegm. Has been taking nebulizer treatments at home and, in the ED, without any relief. Denies any chest pain. No nausea, vomiting. He was given Lasix in the ER and in the infusion clinic, with no relief. Patient is a very poor historian. He gets distracted, quite easily. So history is very  limited.  Hospital Course:  The patient was admitted and felt to be in congestive heart failure based on clinical and chest x-ray findings. He was treated with intravenous diuretics. He made good improvement. X-ray also is suggestive of pneumonia, healthcare associated and he was started on antibiotics. His renal function somewhat deteriorated but his clinical condition improved. He is now stable for discharge. However he is in a poor condition as far as functional status in a combination of heart failure and bladder cancer and poor function probably warrants hospice eligibility, prognosis being less than 6 months. He wishes to go back to [facility] today and I think this is probably appropriate at this point in time. His diuretics can be oral but at a slightly higher dose than before.  Procedures:  None.   Consultations:  None.  Discharge Exam: Filed Vitals:   04/18/13 2102 04/19/13 0445 04/19/13 0803 04/19/13 0817  BP: 151/62 159/63  154/63  Pulse: 63 65  65  Temp: 98.3 F (36.8 C) 98.4 F (36.9 C)    TempSrc:  Oral    Resp: 20 20    Height:      Weight:  65.944 kg (145 lb 6.1 oz)    SpO2: 100% 95% 98% 98%    General: Looks systemically well. Frail. Cardiovascular: Heart sounds are present and normal without gallop rhythm. Jugular venous pressure is not elevated. Respiratory: Lung fields show very minimal inspiratory crackles at the bases, much improved from yesterday. He appears to be alert and orientated today.  Discharge Instructions  Discharge Orders   Future Orders Complete By Expires     Diet -  low sodium heart healthy  As directed     Increase activity slowly  As directed         Medication List    STOP taking these medications       nitrofurantoin (macrocrystal-monohydrate) 100 MG capsule  Commonly known as:  MACROBID      TAKE these medications       albuterol 108 (90 BASE) MCG/ACT inhaler  Commonly known as:  PROVENTIL HFA;VENTOLIN HFA  Inhale 1-2  puffs into the lungs every 6 (six) hours as needed for wheezing.     amLODipine 10 MG tablet  Commonly known as:  NORVASC  Take 1 tablet (10 mg total) by mouth daily.     ARTHRITIS PAIN RELIEVER 650 MG CR tablet  Generic drug:  acetaminophen  Take 650 mg by mouth daily as needed. For pain     aspirin EC 325 MG tablet  Take 1 tablet (325 mg total) by mouth daily.     carvedilol 3.125 MG tablet  Commonly known as:  COREG  Take 1 tablet (3.125 mg total) by mouth 2 (two) times daily with a meal.     dextromethorphan-guaiFENesin 30-600 MG per 12 hr tablet  Commonly known as:  MUCINEX DM  Take 1 tablet by mouth every 12 (twelve) hours.     DSS 100 MG Caps  Take 200 mg by mouth daily.     feeding supplement Liqd  Take 30 mLs by mouth daily.     food thickener Powd  Commonly known as:  THICK IT  Take 1 g by mouth as needed (thicken liquids).     furosemide 40 MG tablet  Commonly known as:  LASIX  Take 1 tablet (40 mg total) by mouth 2 (two) times daily.     iron polysaccharides 150 MG capsule  Commonly known as:  NIFEREX  Take 1 capsule (150 mg total) by mouth daily.     latanoprost 0.005 % ophthalmic solution  Commonly known as:  XALATAN  Place 1 drop into both eyes at bedtime.     levofloxacin 250 MG tablet  Commonly known as:  LEVAQUIN  Take 1 tablet (250 mg total) by mouth daily.     levothyroxine 100 MCG tablet  Commonly known as:  SYNTHROID, LEVOTHROID  Take 100 mcg by mouth daily before breakfast.     LORazepam 0.5 MG tablet  Commonly known as:  ATIVAN  Take 0.5 tablets (0.25 mg total) by mouth every 6 (six) hours as needed for anxiety.     morphine CONCENTRATE 10 mg / 0.5 ml concentrated solution  Take 0.5 mLs (10 mg total) by mouth every 4 (four) hours as needed.     simvastatin 20 MG tablet  Commonly known as:  ZOCOR  Take 20 mg by mouth every evening.     terazosin 1 MG capsule  Commonly known as:  HYTRIN  Take 1 mg by mouth at bedtime.        Allergies  Allergen Reactions  . Shellfish Allergy Other (See Comments)    Swelling of throat and tongue.      The results of significant diagnostics from this hospitalization (including imaging, microbiology, ancillary and laboratory) are listed below for reference.    Significant Diagnostic Studies: Dg Chest 1 View  04/18/2013   *RADIOLOGY REPORT*  Clinical Data: Congestive heart failure  CHEST - 1 VIEW  Comparison: 04/17/2013; 04/16/2013; 04/12/2013  Findings:  Grossly unchanged cardiac silhouette and mediastinal contours with atherosclerotic calcifications within the  thoracic aorta. Pulmonary vasculature remains indistinct with cephalization of flow. Grossly unchanged heterogeneous opacities in the peripheral aspect of the right mid lung as well as left basilar/retrocardiac heterogeneous / consolidative opacities.  Trace bilateral effusions are not excluded.  No pneumothorax.  Unchanged bones.  IMPRESSION: Grossly unchanged findings suggestive of pulmonary edema with bilateral air space opacities, atelectasis versus infiltrate.   Original Report Authenticated By: Tacey Ruiz, MD   Dg Chest 2 View  04/12/2013   *RADIOLOGY REPORT*  Clinical Data: Shortness breath.  Cough.  CHEST - 2 VIEW  Comparison: 04/04/2013  Findings: Small bilateral pleural effusions are again seen on the lateral projection.  Decreased bibasilar pulmonary opacity likely reflects decreased pulmonary edema or atelectasis.  No new or worsening areas of pulmonary opacity are seen.  Mild cardiomegaly stable.  Pulmonary hyperinflation again seen, likely due to underlying COPD.  IMPRESSION:  1.  Decreased bibasilar pulmonary edema or atelectasis. 2.  Stable small bilateral pleural effusions. 3.  Stable mild cardiomegaly. 4.  Probable COPD.   Original Report Authenticated By: Myles Rosenthal, M.D.   Dg Chest 2 View  04/04/2013   *RADIOLOGY REPORT*  Clinical Data: Shortness of breath.  CHEST - 2 VIEW  Comparison: 03/22/2013   Findings: The cardiomediastinal silhouette is unremarkable. Small to moderate bilateral pleural effusions and bilateral lower lung opacities are present. There is no evidence of pleural effusion or pneumothorax. No acute bony abnormalities are present. No discrete pulmonary mass is present.  IMPRESSION: Small to moderate bilateral pleural effusions with bilateral lower lung atelectasis versus airspace disease.   Original Report Authenticated By: Harmon Pier, M.D.   Dg Chest Port 1 View  04/19/2013   *RADIOLOGY REPORT*  Clinical Data: CHF  PORTABLE CHEST - 1 VIEW  Comparison: 04/18/2013; 04/17/2013; 03/22/2010  Findings: Grossly unchanged cardiac silhouette and mediastinal contours. No new pulmonary vasculature appears slightly more distinct on the present examination.  No change to slight worsening and ill-defined heterogeneous opacities within the peripheral aspect of the right mid lung.  Slight worsening in right basilar linear heterogeneous opacities have perhaps atelectasis.  Unchanged left basilar/consolidative opacities.  Trace bilateral effusions are not excluded.  No pneumothorax and unchanged bones.  IMPRESSION: 1.  Suspected minimal improvement in pulmonary edema. 2.  No change to slight worsening and heterogeneous opacities within the peripheral aspect of the right mid lung while possibly representing of asymmetric alveolar pulmonary edema though worrisome for underlying infection. 3.  Bibasilar heterogeneous opacities, left greater than right, atelectasis versus infiltrate.   Original Report Authenticated By: Tacey Ruiz, MD   Dg Chest Port 1 View  04/17/2013   *RADIOLOGY REPORT*  Clinical Data: Cough and chest pain  PORTABLE CHEST - 1 VIEW  Comparison: 04/16/2013  Findings: Diffuse bilateral airspace disease is again present. There has been some increased aeration and clearing at the lung bases likely reflecting improving volume loss at the lung bases. Small pleural effusions may be present.  No  pneumothorax.  IMPRESSION: Diffuse airspace disease as described.  There is been some clearing at the lung bases likely representing improving volume loss.   Original Report Authenticated By: Jolaine Click, M.D.   Dg Chest Port 1 View  04/16/2013   *RADIOLOGY REPORT*  Clinical Data: Shortness of breath.  PORTABLE CHEST - 1 VIEW  Comparison: 04/12/2013.  Findings: The cardiac silhouette, mediastinal and hilar contours are stable.  There is tortuosity and calcification of the thoracic aorta.  Slight worsening lung aeration likely a combination of edema and  atelectasis.  Small pleural effusions are suspected.  IMPRESSION: Slight worsening lung aeration likely due to mild edema and atelectasis.   Original Report Authenticated By: Rudie Meyer, M.D.   Dg Chest Port 1 View  03/22/2013   *RADIOLOGY REPORT*  Clinical Data: Shortness of breath.  PORTABLE CHEST - 1 VIEW  Comparison: 03/16/2013  Findings: Mild cardiac enlargement.  Normal pulmonary vascularity. No focal consolidation in the lungs.  No blunting of costophrenic angles.  Scattered fibrosis and central peribronchial thickening suggesting chronic bronchitis.  No pneumothorax.  Calcification of the aorta.  Marked degenerative changes in the left shoulder with bone remodelling. Old right rib fractures.  No significant change since previous study.  IMPRESSION: Cardiac enlargement.  Chronic bronchitic changes.  No focal consolidation or acute change.   Original Report Authenticated By: Burman Nieves, M.D.        Labs: Basic Metabolic Panel:  Recent Labs Lab 04/12/13 1806 04/16/13 0256 04/17/13 1748 04/18/13 0332 04/19/13 0650  NA 138 137 139 138 139  K 4.4 4.2 4.2 3.8 3.6  CL 101 99 99 99 100  CO2 26 26 26 26 30   GLUCOSE 96 98 117* 106* 95  BUN 45* 53* 48* 46* 48*  CREATININE 2.05* 1.94* 1.84* 1.89* 2.08*  CALCIUM 8.7 8.6 9.1 8.8 8.7   Liver Function Tests:  Recent Labs Lab 04/12/13 1806 04/17/13 1748 04/18/13 0332  AST 17 24 20    ALT 10 17 14   ALKPHOS 87 93 85  BILITOT 0.3 0.7 0.7  PROT 6.6 7.3 6.6  ALBUMIN 2.9* 3.0* 2.7*     CBC:  Recent Labs Lab 04/12/13 1806 04/16/13 0256 04/16/13 1305 04/17/13 1748 04/18/13 0332  WBC 17.5* 10.0 10.9* 13.4* 12.5*  NEUTROABS 13.3*  --   --  10.4*  --   HGB 7.9* 7.0* 7.4* 10.7* 10.0*  HCT 24.3* 21.3* 23.0* 32.4* 30.2*  MCV 89.0 88.0 88.1 86.4 86.0  PLT 569* 513* 585* 633* 547*   Cardiac Enzymes:  Recent Labs Lab 04/17/13 2201 04/18/13 0331 04/18/13 0920  TROPONINI <0.30 <0.30 <0.30   BNP: BNP (last 3 results)  Recent Labs  04/16/13 0349 04/17/13 1748 04/19/13 0650  PROBNP 11816.0* 14323.0* 18596.0*         Signed:  Elleana Stillson C  [facility] 04/19/2013, 11:04 AM

## 2013-04-19 NOTE — Progress Notes (Addendum)
Nurse spoke with Shon Hale, Supervisor at [facility].  Stated that patient would need order from his PCP for hospice consult.  Asked if we would fax the patient's demographic sheet and d/c summary to [facility].  Stated that they would contact pt's PCP, Dr. Regino Schultze on Monday for an order.  Dr. Karilyn Cota, hospitalist, notified.  Notified Kayla at [facility] of plan for Hospice to contact facility and contact PCP on Monday.

## 2013-04-19 NOTE — Progress Notes (Signed)
Pt's O2 sats on room air 94%.

## 2013-04-23 NOTE — Progress Notes (Signed)
Utilization Review Completed.   Emmerson Shuffield, RN, BSN Nurse Case Manager  [phone]  

## 2013-07-31 ENCOUNTER — Inpatient Hospital Stay (HOSPITAL_COMMUNITY)
Admission: RE | Admit: 2013-07-31 | Discharge: 2013-08-06 | DRG: 682 | Disposition: A | Discharge status: Home Health | Payer: Medicare Other | Attending: Internal Medicine | Admitting: Internal Medicine

## 2013-07-31 ENCOUNTER — Encounter (HOSPITAL_COMMUNITY): Payer: Self-pay | Admitting: Emergency Medicine

## 2013-07-31 ENCOUNTER — Emergency Department (HOSPITAL_COMMUNITY): Payer: Medicare Other

## 2013-07-31 DIAGNOSIS — I5031 Acute diastolic (congestive) heart failure: Secondary | ICD-10-CM

## 2013-07-31 DIAGNOSIS — Z978 Presence of other specified devices: Secondary | ICD-10-CM

## 2013-07-31 DIAGNOSIS — R0602 Shortness of breath: Secondary | ICD-10-CM

## 2013-07-31 DIAGNOSIS — A4902 Methicillin resistant Staphylococcus aureus infection, unspecified site: Secondary | ICD-10-CM | POA: Diagnosis present

## 2013-07-31 DIAGNOSIS — N138 Other obstructive and reflux uropathy: Secondary | ICD-10-CM | POA: Diagnosis present

## 2013-07-31 DIAGNOSIS — M129 Arthropathy, unspecified: Secondary | ICD-10-CM | POA: Diagnosis present

## 2013-07-31 DIAGNOSIS — N390 Urinary tract infection, site not specified: Secondary | ICD-10-CM

## 2013-07-31 DIAGNOSIS — E860 Dehydration: Secondary | ICD-10-CM

## 2013-07-31 DIAGNOSIS — I10 Essential (primary) hypertension: Secondary | ICD-10-CM

## 2013-07-31 DIAGNOSIS — N1330 Unspecified hydronephrosis: Secondary | ICD-10-CM

## 2013-07-31 DIAGNOSIS — Z66 Do not resuscitate: Secondary | ICD-10-CM | POA: Diagnosis present

## 2013-07-31 DIAGNOSIS — N183 Chronic kidney disease, stage 3 unspecified: Secondary | ICD-10-CM | POA: Diagnosis present

## 2013-07-31 DIAGNOSIS — E039 Hypothyroidism, unspecified: Secondary | ICD-10-CM | POA: Diagnosis present

## 2013-07-31 DIAGNOSIS — N189 Chronic kidney disease, unspecified: Secondary | ICD-10-CM | POA: Diagnosis present

## 2013-07-31 DIAGNOSIS — J810 Acute pulmonary edema: Secondary | ICD-10-CM

## 2013-07-31 DIAGNOSIS — IMO0002 Reserved for concepts with insufficient information to code with codable children: Secondary | ICD-10-CM

## 2013-07-31 DIAGNOSIS — Z91013 Allergy to seafood: Secondary | ICD-10-CM

## 2013-07-31 DIAGNOSIS — Z79899 Other long term (current) drug therapy: Secondary | ICD-10-CM

## 2013-07-31 DIAGNOSIS — Z87440 Personal history of urinary (tract) infections: Secondary | ICD-10-CM

## 2013-07-31 DIAGNOSIS — N184 Chronic kidney disease, stage 4 (severe): Secondary | ICD-10-CM | POA: Diagnosis present

## 2013-07-31 DIAGNOSIS — I639 Cerebral infarction, unspecified: Secondary | ICD-10-CM | POA: Diagnosis present

## 2013-07-31 DIAGNOSIS — E440 Moderate protein-calorie malnutrition: Secondary | ICD-10-CM | POA: Diagnosis present

## 2013-07-31 DIAGNOSIS — N401 Enlarged prostate with lower urinary tract symptoms: Secondary | ICD-10-CM | POA: Diagnosis present

## 2013-07-31 DIAGNOSIS — N179 Acute kidney failure, unspecified: Principal | ICD-10-CM

## 2013-07-31 DIAGNOSIS — D649 Anemia, unspecified: Secondary | ICD-10-CM

## 2013-07-31 DIAGNOSIS — E785 Hyperlipidemia, unspecified: Secondary | ICD-10-CM | POA: Diagnosis present

## 2013-07-31 DIAGNOSIS — Z8673 Personal history of transient ischemic attack (TIA), and cerebral infarction without residual deficits: Secondary | ICD-10-CM

## 2013-07-31 DIAGNOSIS — C679 Malignant neoplasm of bladder, unspecified: Secondary | ICD-10-CM

## 2013-07-31 DIAGNOSIS — Z87891 Personal history of nicotine dependence: Secondary | ICD-10-CM

## 2013-07-31 DIAGNOSIS — D630 Anemia in neoplastic disease: Secondary | ICD-10-CM | POA: Diagnosis present

## 2013-07-31 DIAGNOSIS — I509 Heart failure, unspecified: Secondary | ICD-10-CM | POA: Diagnosis present

## 2013-07-31 DIAGNOSIS — I129 Hypertensive chronic kidney disease with stage 1 through stage 4 chronic kidney disease, or unspecified chronic kidney disease: Secondary | ICD-10-CM | POA: Diagnosis present

## 2013-07-31 DIAGNOSIS — N2581 Secondary hyperparathyroidism of renal origin: Secondary | ICD-10-CM | POA: Diagnosis present

## 2013-07-31 LAB — URINALYSIS, ROUTINE W REFLEX MICROSCOPIC
Protein, ur: 30 mg/dL — AB
Urobilinogen, UA: 0.2 mg/dL (ref 0.0–1.0)

## 2013-07-31 LAB — CBC WITH DIFFERENTIAL/PLATELET
Basophils Absolute: 0 10*3/uL (ref 0.0–0.1)
Eosinophils Relative: 8 % — ABNORMAL HIGH (ref 0–5)
HCT: 22.8 % — ABNORMAL LOW (ref 39.0–52.0)
Lymphocytes Relative: 22 % (ref 12–46)
Lymphs Abs: 1.8 10*3/uL (ref 0.7–4.0)
MCV: 83.2 fL (ref 78.0–100.0)
Monocytes Absolute: 1.3 10*3/uL — ABNORMAL HIGH (ref 0.1–1.0)
RDW: 16.3 % — ABNORMAL HIGH (ref 11.5–15.5)
WBC: 8.4 10*3/uL (ref 4.0–10.5)

## 2013-07-31 LAB — COMPREHENSIVE METABOLIC PANEL
BUN: 73 mg/dL — ABNORMAL HIGH (ref 6–23)
CO2: 25 mEq/L (ref 19–32)
Calcium: 9 mg/dL (ref 8.4–10.5)
Creatinine, Ser: 3.02 mg/dL — ABNORMAL HIGH (ref 0.50–1.35)
GFR calc Af Amer: 19 mL/min — ABNORMAL LOW (ref >90)
GFR calc non Af Amer: 17 mL/min — ABNORMAL LOW (ref >90)
Glucose, Bld: 114 mg/dL — ABNORMAL HIGH (ref 70–99)

## 2013-07-31 LAB — TROPONIN I: Troponin I: <0.3 ng/mL (ref <0.30)

## 2013-07-31 LAB — PRO B NATRIURETIC PEPTIDE: Pro B Natriuretic peptide (BNP): 13832 pg/mL — ABNORMAL HIGH (ref 0–450)

## 2013-07-31 MED ORDER — ONDANSETRON HCL 4 MG/2ML IJ SOLN
4.0000 mg | Freq: Three times a day (TID) | INTRAMUSCULAR | Status: AC | PRN
  Administered 2013-07-31: 4 mg via INTRAVENOUS
  Filled 2013-07-31: qty 2

## 2013-07-31 MED ORDER — MORPHINE SULFATE 2 MG/ML IJ SOLN
2.0000 mg | Freq: Once | INTRAMUSCULAR | Status: AC
  Administered 2013-07-31: 2 mg via INTRAVENOUS
  Filled 2013-07-31: qty 1

## 2013-07-31 MED ORDER — SODIUM CHLORIDE 0.9 % IV SOLN: INTRAVENOUS | Status: DC

## 2013-07-31 MED ORDER — DEXTROSE 5 % IV SOLN
1.0000 g | Freq: Once | INTRAVENOUS | Status: AC
  Administered 2013-07-31: 1 g via INTRAVENOUS
  Filled 2013-07-31: qty 10

## 2013-07-31 MED ORDER — ONDANSETRON HCL 4 MG/2ML IJ SOLN
4.0000 mg | Freq: Once | INTRAMUSCULAR | Status: AC
  Administered 2013-07-31: 4 mg via INTRAVENOUS
  Filled 2013-07-31: qty 2

## 2013-07-31 MED ORDER — HYDROMORPHONE HCL PF 1 MG/ML IJ SOLN
0.5000 mg | INTRAMUSCULAR | Status: AC | PRN
  Administered 2013-07-31 – 2013-08-01 (×2): 0.5 mg via INTRAVENOUS
  Filled 2013-07-31 (×2): qty 1

## 2013-07-31 NOTE — ED Provider Notes (Signed)
CSN: [id number]     Arrival date & time 07/31/13  2040 History   This chart was scribed for Glynn Octave, MD by Carl Best, ED Scribe. This patient was seen in room APA11/APA11 and the patient's care was started at 8:48 PM     Chief Complaint  Patient presents with  . Neck Pain   (Consider location/radiation/quality/duration/timing/severity/associated sxs/prior Treatment) The history is provided by the patient. No language interpreter was used.   HPI Comments: Reginald Waller is a 79 y.o. male who presents to the Emergency Department complaining of constant neck pain that started two nights ago.  He states that the pain worsens when he turns his head and radiates bilaterally to his shoulders.  Patient denies any pain to his medial back and denies any prior h/o neck pain.  He also denies dysphagia, HA, SOB, numbness, chest pain, abdominal pain, and fever.  He does not have a h/o falls.  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  Patient reports chronic weakness and numbness in the left arm from CVA and denies any changes.  Patient takes one pain pill daily.     Past Medical History  Diagnosis Date  . Hypertension   . Arthritis   . Stroke   . Malaria   . Urothelial cancer 12/16/2012  . Anemia   . Hyperlipidemia    Past Surgical History  Procedure Laterality Date  . Hernia repair    . Cataract extraction    . Appendectomy    . Eye surgery    . Cystoscopy  12/08/2012    Procedure: CYSTOSCOPY FLEXIBLE;  Surgeon: Ky Barban, MD;  Location: [facility];  Service: Urology;  Laterality: N/A;  . Transurethral resection of bladder tumor  12/09/2012    Procedure: TRANSURETHRAL RESECTION OF BLADDER TUMOR (TURBT);  Surgeon: Ky Barban, MD;  Location: [facility];  Service: Urology;  Laterality: N/A;   Family History  Problem Relation Age of Onset  . Diabetes Mother   . Diabetes Father   . Cancer Sister     breast  . Cancer  Sister     thyroid  . Cancer Brother     leukemia  . Cancer Brother   . Coronary artery disease Brother    History  Substance Use Topics  . Smoking status: Former Games developer  . Smokeless tobacco: Former Neurosurgeon  . Alcohol Use: No    Review of Systems A complete 10 system review of systems was obtained and all systems are negative except as noted in the HPI and PMH.   Allergies  Shellfish allergy  Home Medications   No current outpatient prescriptions on file. Triage Vitals: BP 148/54  Pulse 70  Temp(Src) 97.9 F (36.6 C) (Oral)  Resp 20  SpO2 95% Physical Exam  Nursing note and vitals reviewed. Constitutional: He is oriented to person, place, and time. He appears well-developed and well-nourished.  HENT:  Head: Normocephalic and atraumatic.  Right Ear: External ear normal.  Left Ear: External ear normal.  Dry mucous membranes  Eyes: Conjunctivae and EOM are normal. Pupils are equal, round, and reactive to light.  Neck: Phonation normal.  Reduced ROM secondary to pain, difuse lower paraspinal cervical tenderness, no midline tenderness, no meningismus,  Cardiovascular: Normal rate, regular rhythm, normal heart sounds and intact distal pulses.   Pulmonary/Chest: Effort normal and breath sounds normal. He exhibits no bony tenderness.  Abdominal: Soft. Normal appearance. There is  no tenderness.  Musculoskeletal: Normal range of motion.  ROM of shoulders reduced   Neurological: He is alert and oriented to person, place, and time. He has normal strength. No cranial nerve deficit or sensory deficit. He exhibits normal muscle tone. Coordination normal.  Equal grip strengths bilaterally, mild dysarthria chronic per patient  Skin: Skin is warm, dry and intact.  Psychiatric: He has a normal mood and affect. His behavior is normal. Judgment and thought content normal.    ED Course  Procedures (including critical care time)  DIAGNOSTIC STUDIES: Oxygen Saturation is 95% on room air,  adequate by my interpretation.    COORDINATION OF CARE: 8:56 PM- Discussed obtaining X-rays with patient at bedside and patient agreed. 10:46 PM- RECHECK.  Discussed X-ray results, starting  IV fluids to treat dehydration and pain medication, and admitting patient into the hospital at bedside and patient agreed.   Labs Review Labs Reviewed  CBC WITH DIFFERENTIAL - Abnormal; Notable for the following:    RBC 2.74 (*)    Hemoglobin 7.3 (*)    HCT 22.8 (*)    RDW 16.3 (*)    Platelets 471 (*)    Monocytes Relative 16 (*)    Monocytes Absolute 1.3 (*)    Eosinophils Relative 8 (*)    All other components within normal limits  COMPREHENSIVE METABOLIC PANEL - Abnormal; Notable for the following:    Sodium 133 (*)    Potassium 5.2 (*)    Glucose, Bld 114 (*)    BUN 73 (*)    Creatinine, Ser 3.02 (*)    Albumin 3.2 (*)    Total Bilirubin 0.2 (*)    GFR calc non Af Amer 17 (*)    GFR calc Af Amer 19 (*)    All other components within normal limits  URINALYSIS, ROUTINE W REFLEX MICROSCOPIC - Abnormal; Notable for the following:    APPearance HAZY (*)    Hgb urine dipstick SMALL (*)    Protein, ur 30 (*)    Nitrite POSITIVE (*)    Leukocytes, UA LARGE (*)    All other components within normal limits  PRO B NATRIURETIC PEPTIDE - Abnormal; Notable for the following:    Pro B Natriuretic peptide (BNP) 13832.0 (*)    All other components within normal limits  URINE MICROSCOPIC-ADD ON - Abnormal; Notable for the following:    Bacteria, UA MANY (*)    All other components within normal limits  URINE CULTURE  MRSA PCR SCREENING  TROPONIN I   Imaging Review Dg Chest 1 View  07/31/2013   *RADIOLOGY REPORT*  Clinical Data: Neck pain, shoulder pain, back pain  CHEST - 1 VIEW  Comparison: 04/19/2013  Findings: Enlargement of cardiac silhouette. Calcified tortuous aorta. Pulmonary vascular cephalization. Minimal chronic interstitial infiltrates question related to chronic failure or fibrosis.  Aeration appears improved since previous exam likely representing rate improved edema. No pleural effusion or pneumothorax. Bones demineralized. Probable mixed lytic and sclerotic lesion of the proximal right humerus, appears present on previous exams.  IMPRESSION: Enlargement of cardiac silhouette with pulmonary vascular congestion. Scattered chronic interstitial infiltrates question related to chronic failure or fibrosis. No definite acute process.   Original Report Authenticated By: Ulyses Southward, M.D.   Ct Head Wo Contrast  07/31/2013    *RADIOLOGY REPORT*  Clinical Data:  Neck pain, stroke, hypertension, neck pain  CT HEAD WITHOUT CONTRAST CT CERVICAL SPINE WITHOUT CONTRAST  Technique:  Multidetector CT imaging of the head and cervical spine  was performed following the standard protocol without intravenous contrast.  Multiplanar CT image reconstructions of the cervical spine were also generated.  Comparison:  Brain MRI 12/15/2012  CT HEAD  Findings: No acute intracranial hemorrhage.  No focal mass lesion. No CT evidence of acute infarction.   No midline shift or mass effect.  No hydrocephalus.  Basilar cisterns are patent.  Extensive periventricular white matter hypodensities as well subcortical white matter hypodensities most severe in the right frontal temporal lobe.  Paranasal sinuses and mastoid air cells are clear.  Orbits are normal.  IMPRESSION:  1.  No acute intracranial findings. 2.  Severe atrophy and chronic white matter disease.  CT CERVICAL SPINE  Findings: No prevertebral soft tissue swelling.  Normal alignment cervical vertebral bodies.  There is loss of disc space at C6 and C7.  There is degenerative anterolisthesis of C3 on C4, C4-C5, C5 on C6.  There is degenerative in the ligaments posterior the dens with settling of the basion tip into the dens.  These are chronic findings.  No epidural paraspinal hematoma.  IMPRESSION:  1.  No acute findings cervical spine.  2.  Settling of the basion into  the tip of the dens.  3.  Chronic changes within the transverse ligaments posterior to the dens.  4.   Multilevel disc osteophytic disease with degenerative mild subluxations throughout the cervical spine.   Original Report Authenticated By: Genevive Bi, M.D.   Ct Cervical Spine Wo Contrast  07/31/2013    *RADIOLOGY REPORT*  Clinical Data:  Neck pain, stroke, hypertension, neck pain  CT HEAD WITHOUT CONTRAST CT CERVICAL SPINE WITHOUT CONTRAST  Technique:  Multidetector CT imaging of the head and cervical spine was performed following the standard protocol without intravenous contrast.  Multiplanar CT image reconstructions of the cervical spine were also generated.  Comparison:  Brain MRI 12/15/2012  CT HEAD  Findings: No acute intracranial hemorrhage.  No focal mass lesion. No CT evidence of acute infarction.   No midline shift or mass effect.  No hydrocephalus.  Basilar cisterns are patent.  Extensive periventricular white matter hypodensities as well subcortical white matter hypodensities most severe in the right frontal temporal lobe.  Paranasal sinuses and mastoid air cells are clear.  Orbits are normal.  IMPRESSION:  1.  No acute intracranial findings. 2.  Severe atrophy and chronic white matter disease.  CT CERVICAL SPINE  Findings: No prevertebral soft tissue swelling.  Normal alignment cervical vertebral bodies.  There is loss of disc space at C6 and C7.  There is degenerative anterolisthesis of C3 on C4, C4-C5, C5 on C6.  There is degenerative in the ligaments posterior the dens with settling of the basion tip into the dens.  These are chronic findings.  No epidural paraspinal hematoma.  IMPRESSION:  1.  No acute findings cervical spine.  2.  Settling of the basion into the tip of the dens.  3.  Chronic changes within the transverse ligaments posterior to the dens.  4.   Multilevel disc osteophytic disease with degenerative mild subluxations throughout the cervical spine.   Original Report  Authenticated By: Genevive Bi, M.D.    MDM   1. Acute renal failure   2. Dehydration    Atraumatic neck pain and upper back pain for the past 2 days. No focal weakness, numbness or tingling. No chest pain. Equal grip strength bilaterally.  EKG shows new lateral ST depressions. CT scan shows no fracture dislocation of the neck. No metastasis seen.  Appearing dry on exam. Worsening Cr and hemoglobin. During admission 3 months ago was though eligible for hospice but not yet enrolled.  Neck pain likely from arthritis. No apparent new neuro deficits. No meningismus. With EKG changes, cannot rule out cardiac. First troponin negative. +UA but indwelling foley. Patient agreeable to admission for IV fluids and serial troponins.   Date: 07/31/2013  Rate:  rhythm 68  Rhythm: normal sinus rhythm  QRS Axis: normal  Intervals: normal  ST/T Wave abnormalities: ST depressions inferiorly and ST depressions laterally  Conduction Disutrbances:none  Narrative Interpretation:   Old EKG Reviewed: changes noted   I personally performed the services described in this documentation, which was scribed in my presence. The recorded information has been reviewed and is accurate.    Glynn Octave, MD 08/01/13 [phone]

## 2013-07-31 NOTE — ED Notes (Signed)
Pt c/o neck pain x 2 days. Pt denies any injury

## 2013-08-01 DIAGNOSIS — E440 Moderate protein-calorie malnutrition: Secondary | ICD-10-CM

## 2013-08-01 DIAGNOSIS — N189 Chronic kidney disease, unspecified: Secondary | ICD-10-CM | POA: Diagnosis present

## 2013-08-01 DIAGNOSIS — N179 Acute kidney failure, unspecified: Secondary | ICD-10-CM | POA: Diagnosis present

## 2013-08-01 DIAGNOSIS — I6350 Cerebral infarction due to unspecified occlusion or stenosis of unspecified cerebral artery: Secondary | ICD-10-CM

## 2013-08-01 DIAGNOSIS — C679 Malignant neoplasm of bladder, unspecified: Secondary | ICD-10-CM

## 2013-08-01 DIAGNOSIS — E860 Dehydration: Secondary | ICD-10-CM

## 2013-08-01 LAB — BASIC METABOLIC PANEL
CO2: 26 mEq/L (ref 19–32)
Calcium: 8.3 mg/dL — ABNORMAL LOW (ref 8.4–10.5)
GFR calc non Af Amer: 19 mL/min — ABNORMAL LOW (ref >90)
Glucose, Bld: 84 mg/dL (ref 70–99)
Potassium: 4.5 mEq/L (ref 3.5–5.1)
Sodium: 139 mEq/L (ref 135–145)

## 2013-08-01 LAB — CBC
Hemoglobin: 7 g/dL — ABNORMAL LOW (ref 13.0–17.0)
MCH: 26.1 pg (ref 26.0–34.0)
MCV: 83.6 fL (ref 78.0–100.0)
Platelets: 437 10*3/uL — ABNORMAL HIGH (ref 150–400)
RBC: 2.68 MIL/uL — ABNORMAL LOW (ref 4.22–5.81)
WBC: 8.2 10*3/uL (ref 4.0–10.5)

## 2013-08-01 LAB — PREPARE RBC (CROSSMATCH)

## 2013-08-01 MED ORDER — CARVEDILOL 3.125 MG PO TABS
3.1250 mg | ORAL_TABLET | Freq: Two times a day (BID) | ORAL | Status: DC
  Administered 2013-08-01 – 2013-08-06 (×12): 3.125 mg via ORAL
  Filled 2013-08-01 (×12): qty 1

## 2013-08-01 MED ORDER — ASPIRIN EC 81 MG PO TBEC
81.0000 mg | DELAYED_RELEASE_TABLET | ORAL | Status: DC
  Administered 2013-08-01 – 2013-08-05 (×3): 81 mg via ORAL
  Filled 2013-08-01 (×3): qty 1

## 2013-08-01 MED ORDER — ALBUTEROL SULFATE HFA 108 (90 BASE) MCG/ACT IN AERS
1.0000 | INHALATION_SPRAY | Freq: Four times a day (QID) | RESPIRATORY_TRACT | Status: DC | PRN
  Administered 2013-08-01 – 2013-08-02 (×2): 2 via RESPIRATORY_TRACT
  Filled 2013-08-01: qty 6.7

## 2013-08-01 MED ORDER — LEVOTHYROXINE SODIUM 100 MCG PO TABS
100.0000 ug | ORAL_TABLET | Freq: Every day | ORAL | Status: DC
  Administered 2013-08-01 – 2013-08-06 (×6): 100 ug via ORAL
  Filled 2013-08-01 (×6): qty 1

## 2013-08-01 MED ORDER — LATANOPROST 0.005 % OP SOLN
1.0000 [drp] | Freq: Every day | OPHTHALMIC | Status: DC
  Administered 2013-08-01 – 2013-08-05 (×5): 1 [drp] via OPHTHALMIC
  Filled 2013-08-01: qty 2.5

## 2013-08-01 MED ORDER — MUPIROCIN 2 % EX OINT
1.0000 "application " | TOPICAL_OINTMENT | Freq: Two times a day (BID) | CUTANEOUS | Status: AC
  Administered 2013-08-01 – 2013-08-05 (×10): 1 via NASAL
  Filled 2013-08-01 (×3): qty 22

## 2013-08-01 MED ORDER — LORAZEPAM 0.5 MG PO TABS: 0.2500 mg | ORAL_TABLET | Freq: Four times a day (QID) | ORAL | Status: DC | PRN

## 2013-08-01 MED ORDER — DOCUSATE SODIUM 100 MG PO CAPS
200.0000 mg | ORAL_CAPSULE | Freq: Two times a day (BID) | ORAL | Status: DC
  Administered 2013-08-01 – 2013-08-06 (×11): 200 mg via ORAL
  Filled 2013-08-01 (×11): qty 2

## 2013-08-01 MED ORDER — AMLODIPINE BESYLATE 5 MG PO TABS
10.0000 mg | ORAL_TABLET | Freq: Every day | ORAL | Status: DC
  Administered 2013-08-01 – 2013-08-06 (×6): 10 mg via ORAL
  Filled 2013-08-01 (×2): qty 2
  Filled 2013-08-01: qty 1
  Filled 2013-08-01 (×3): qty 2

## 2013-08-01 MED ORDER — ACETAMINOPHEN 650 MG RE SUPP: 650.0000 mg | Freq: Four times a day (QID) | RECTAL | Status: DC | PRN

## 2013-08-01 MED ORDER — SODIUM CHLORIDE 0.9 % IV SOLN
INTRAVENOUS | Status: DC
  Administered 2013-08-01 – 2013-08-06 (×7): via INTRAVENOUS

## 2013-08-01 MED ORDER — TERAZOSIN HCL 1 MG PO CAPS
1.0000 mg | ORAL_CAPSULE | Freq: Every day | ORAL | Status: DC
  Administered 2013-08-01 – 2013-08-05 (×5): 1 mg via ORAL
  Filled 2013-08-01 (×5): qty 1

## 2013-08-01 MED ORDER — CHLORHEXIDINE GLUCONATE CLOTH 2 % EX PADS
6.0000 | MEDICATED_PAD | Freq: Every day | CUTANEOUS | Status: AC
  Administered 2013-08-01 – 2013-08-05 (×5): 6 via TOPICAL

## 2013-08-01 MED ORDER — TRAZODONE HCL 50 MG PO TABS
25.0000 mg | ORAL_TABLET | Freq: Every evening | ORAL | Status: DC | PRN
  Administered 2013-08-02 – 2013-08-05 (×3): 25 mg via ORAL
  Filled 2013-08-01: qty 1
  Filled 2013-08-01: qty 2
  Filled 2013-08-01 (×2): qty 1

## 2013-08-01 MED ORDER — HYDROMORPHONE HCL PF 1 MG/ML IJ SOLN
0.5000 mg | INTRAMUSCULAR | Status: AC | PRN
  Administered 2013-08-01: 0.5 mg via INTRAVENOUS
  Filled 2013-08-01: qty 1

## 2013-08-01 MED ORDER — ACETAMINOPHEN 325 MG PO TABS
650.0000 mg | ORAL_TABLET | Freq: Four times a day (QID) | ORAL | Status: DC | PRN
  Filled 2013-08-01: qty 2

## 2013-08-01 NOTE — H&P (Signed)
[facility] History and Physical  RON BESKE  FAO:[id number]  DOB: June 16, 1919   DOA: 07/31/2013   PCP:   Kirk Ruths, MD   Chief Complaint:  Neck pain for 2 days  HPI: Reginald Waller is a 79 y.o. male.   Presents from assisted living facility with neck pain found to be dehydrated and quite anemic. Currently patient is quite drowsy and is giving some inappropriate answers to questions, but reviewing the emergency room records we find:-    'KYMERE FULLINGTON is a 79 y.o. male who presents to the Emergency Department complaining of constant neck pain that started two nights ago. He states that the pain worsens when he turns his head and radiates bilaterally to his shoulders. Patient denies any pain to his medial back and denies any prior h/o neck pain. He also denies dysphagia, HA, SOB, numbness, chest pain, abdominal pain, and fever. He does not have a h/o falls. 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 Patient reports chronic weakness and numbness in the left arm from CVA and denies any changes. Patient takes one pain pill daily"    Rewiew of Systems:  Unable to get a reliable review of systems due to patient's current mental status  Past Medical History  Diagnosis Date  . Hypertension   . Arthritis   . Stroke   . Malaria   . Urothelial cancer 12/16/2012  . Anemia   . Hyperlipidemia     Past Surgical History  Procedure Laterality Date  . Hernia repair    . Cataract extraction    . Appendectomy    . Eye surgery    . Cystoscopy  12/08/2012    Procedure: CYSTOSCOPY FLEXIBLE;  Surgeon: Ky Barban, MD;  Location: [facility];  Service: Urology;  Laterality: N/A;  . Transurethral resection of bladder tumor  12/09/2012    Procedure: TRANSURETHRAL RESECTION OF BLADDER TUMOR (TURBT);  Surgeon: Ky Barban, MD;  Location: [facility];  Service: Urology;  Laterality: N/A;    Medications:  HOME MEDS: Prior to  Admission medications   Medication Sig Start Date End Date Taking? Authorizing Provider  acetaminophen (ARTHRITIS PAIN RELIEVER) 650 MG CR tablet Take 325-650 mg by mouth daily as needed. For pain   Yes Historical Provider, MD  amLODipine (NORVASC) 10 MG tablet Take 1 tablet (10 mg total) by mouth daily. 12/11/12  Yes Tora Kindred York, PA-C  aspirin EC 81 MG tablet Take 81 mg by mouth every other day.   Yes Historical Provider, MD  carvedilol (COREG) 3.125 MG tablet Take 1 tablet (3.125 mg total) by mouth 2 (two) times daily with a meal. 04/06/13  Yes Lesle Chris Black, NP  docusate sodium (COLACE) 100 MG capsule Take 200 mg by mouth 2 (two) times daily.   Yes Historical Provider, MD  Emollient [facility] THERAPEUTIC) 37 % CREA Apply 1 application topically 2 (two) times daily. Applied to buttocks   Yes Historical Provider, MD  furosemide (LASIX) 40 MG tablet Take 1 tablet (40 mg total) by mouth 2 (two) times daily. 04/19/13  Yes Nimish Normajean Glasgow, MD  iron polysaccharides (POLY-IRON 150) 150 MG capsule Take 150 mg by mouth daily.   Yes Historical Provider, MD  latanoprost (XALATAN) 0.005 % ophthalmic solution Place 1 drop into both eyes at bedtime.   Yes Historical Provider, MD  levothyroxine (SYNTHROID, LEVOTHROID) 100 MCG tablet Take 100 mcg by mouth daily before breakfast.  Yes Historical Provider, MD  LORazepam (ATIVAN) 0.5 MG tablet Take 0.5 tablets (0.25 mg total) by mouth every 6 (six) hours as needed for anxiety. 12/19/12  Yes Marianne L York, PA-C  solifenacin (VESICARE) 5 MG tablet Take 5 mg by mouth daily.   Yes Historical Provider, MD  terazosin (HYTRIN) 1 MG capsule Take 1 mg by mouth at bedtime.   Yes Historical Provider, MD  albuterol (PROVENTIL HFA;VENTOLIN HFA) 108 (90 BASE) MCG/ACT inhaler Inhale 1-2 puffs into the lungs every 6 (six) hours as needed for wheezing. 03/16/13   Nicoletta Dress. Colon Branch, MD  dextromethorphan-guaiFENesin (MUCINEX DM) 30-600 MG per 12 hr tablet Take 1 tablet by mouth  every 12 (twelve) hours.    Historical Provider, MD     Allergies:  Allergies  Allergen Reactions  . Shellfish Allergy Other (See Comments)    Swelling of throat and tongue.    Social History:   reports that he has quit smoking. He has quit using smokeless tobacco. He reports that he does not drink alcohol or use illicit drugs.  Family History: Family History  Problem Relation Age of Onset  . Diabetes Mother   . Diabetes Father   . Cancer Sister     breast  . Cancer Sister     thyroid  . Cancer Brother     leukemia  . Cancer Brother   . Coronary artery disease Brother      Physical Exam: Filed Vitals:   07/31/13 2041 07/31/13 2121 07/31/13 2359  BP: 148/54 144/56 137/49  Pulse: 70 70 69  Temp: 97.9 F (36.6 C) 97.7 F (36.5 C)   TempSrc: Oral Rectal   Resp: 20 20 21   SpO2: 95% 97% 96%   Blood pressure 137/49, pulse 69, temperature 97.7 F (36.5 C), temperature source Rectal, resp. rate 21, SpO2 96.00%. There is no weight on file to calculate BMI.   GEN:  Pleasant elderly Caucasian gentleman lying bed in no acute distress; cooperative with exam PSYCH:  alert and oriented to place and person;  speech difficult to understand HEENT: Mucous membranes pink , dry and anicteric; no cervical lymphadenopathy nor thyromegaly or carotid bruit; no JVD; Breasts:: Not examined CHEST WALL: No tenderness CHEST: Normal respiration, clear to auscultation bilaterally HEART: Regular rate and rhythm; 2/6 systolic murmur BACK: ; no CVA tenderness ABDOMEN: Obese, soft non-tender; no masses, no organomegaly, normal abdominal bowel sounds; no intertriginous candida. Rectal Exam: Not done EXTREMITIES:  age-appropriate arthropathy of the hands and knees; no edema; no ulcerations. Genitalia: not examined PULSES: 2+ and symmetric SKIN: Normal hydration no rash or ulceration    Labs on Admission:  Basic Metabolic Panel:  Recent Labs Lab 07/31/13 2103  NA 133*  K 5.2*  CL 97   CO2 25  GLUCOSE 114*  BUN 73*  CREATININE 3.02*  CALCIUM 9.0   Liver Function Tests:  Recent Labs Lab 07/31/13 2103  AST 18  ALT 10  ALKPHOS 73  BILITOT 0.2*  PROT 7.3  ALBUMIN 3.2*   No results found for this basename: LIPASE, AMYLASE,  in the last 168 hours No results found for this basename: AMMONIA,  in the last 168 hours CBC:  Recent Labs Lab 07/31/13 2103  WBC 8.4  NEUTROABS 4.5  HGB 7.3*  HCT 22.8*  MCV 83.2  PLT 471*   Cardiac Enzymes:  Recent Labs Lab 07/31/13 2103  TROPONINI <0.30   BNP: No components found with this basename: POCBNP,  D-dimer: No components found with this  basename: D-DIMER,  CBG: No results found for this basename: GLUCAP,  in the last 168 hours  Radiological Exams on Admission: Dg Chest 1 View  07/31/2013   *RADIOLOGY REPORT*  Clinical Data: Neck pain, shoulder pain, back pain  CHEST - 1 VIEW  Comparison: 04/19/2013  Findings: Enlargement of cardiac silhouette. Calcified tortuous aorta. Pulmonary vascular cephalization. Minimal chronic interstitial infiltrates question related to chronic failure or fibrosis. Aeration appears improved since previous exam likely representing rate improved edema. No pleural effusion or pneumothorax. Bones demineralized. Probable mixed lytic and sclerotic lesion of the proximal right humerus, appears present on previous exams.  IMPRESSION: Enlargement of cardiac silhouette with pulmonary vascular congestion. Scattered chronic interstitial infiltrates question related to chronic failure or fibrosis. No definite acute process.   Original Report Authenticated By: Ulyses Southward, M.D.   Ct Head Wo Contrast  07/31/2013    *RADIOLOGY REPORT*  Clinical Data:  Neck pain, stroke, hypertension, neck pain  CT HEAD WITHOUT CONTRAST CT CERVICAL SPINE WITHOUT CONTRAST  Technique:  Multidetector CT imaging of the head and cervical spine was performed following the standard protocol without intravenous contrast.  Multiplanar  CT image reconstructions of the cervical spine were also generated.  Comparison:  Brain MRI 12/15/2012  CT HEAD  Findings: No acute intracranial hemorrhage.  No focal mass lesion. No CT evidence of acute infarction.   No midline shift or mass effect.  No hydrocephalus.  Basilar cisterns are patent.  Extensive periventricular white matter hypodensities as well subcortical white matter hypodensities most severe in the right frontal temporal lobe.  Paranasal sinuses and mastoid air cells are clear.  Orbits are normal.  IMPRESSION:  1.  No acute intracranial findings. 2.  Severe atrophy and chronic white matter disease.  CT CERVICAL SPINE  Findings: No prevertebral soft tissue swelling.  Normal alignment cervical vertebral bodies.  There is loss of disc space at C6 and C7.  There is degenerative anterolisthesis of C3 on C4, C4-C5, C5 on C6.  There is degenerative in the ligaments posterior the dens with settling of the basion tip into the dens.  These are chronic findings.  No epidural paraspinal hematoma.  IMPRESSION:  1.  No acute findings cervical spine.  2.  Settling of the basion into the tip of the dens.  3.  Chronic changes within the transverse ligaments posterior to the dens.  4.   Multilevel disc osteophytic disease with degenerative mild subluxations throughout the cervical spine.   Original Report Authenticated By: Genevive Bi, M.D.   Ct Cervical Spine Wo Contrast  07/31/2013    *RADIOLOGY REPORT*  Clinical Data:  Neck pain, stroke, hypertension, neck pain  CT HEAD WITHOUT CONTRAST CT CERVICAL SPINE WITHOUT CONTRAST  Technique:  Multidetector CT imaging of the head and cervical spine was performed following the standard protocol without intravenous contrast.  Multiplanar CT image reconstructions of the cervical spine were also generated.  Comparison:  Brain MRI 12/15/2012  CT HEAD  Findings: No acute intracranial hemorrhage.  No focal mass lesion. No CT evidence of acute infarction.   No midline shift  or mass effect.  No hydrocephalus.  Basilar cisterns are patent.  Extensive periventricular white matter hypodensities as well subcortical white matter hypodensities most severe in the right frontal temporal lobe.  Paranasal sinuses and mastoid air cells are clear.  Orbits are normal.  IMPRESSION:  1.  No acute intracranial findings. 2.  Severe atrophy and chronic white matter disease.  CT CERVICAL SPINE  Findings: No prevertebral  soft tissue swelling.  Normal alignment cervical vertebral bodies.  There is loss of disc space at C6 and C7.  There is degenerative anterolisthesis of C3 on C4, C4-C5, C5 on C6.  There is degenerative in the ligaments posterior the dens with settling of the basion tip into the dens.  These are chronic findings.  No epidural paraspinal hematoma.  IMPRESSION:  1.  No acute findings cervical spine.  2.  Settling of the basion into the tip of the dens.  3.  Chronic changes within the transverse ligaments posterior to the dens.  4.   Multilevel disc osteophytic disease with degenerative mild subluxations throughout the cervical spine.   Original Report Authenticated By: Genevive Bi, M.D.      Assessment/Plan    Active Problems:   Dehydration   Anemia   Acute on chronic renal failure    PLAN: We'll admit this gentleman for hydration Review his status in the morning Repeat labs in the morning  Other plans as per orders.  Code Status: DO NOT RESUSCITATE Family Communication: No family at bedside Disposition Plan: Home within 24 hour   Milani Lowenstein Nocturnist [facility] Pager [phone]   08/01/2013, 12:09 AM

## 2013-08-01 NOTE — Progress Notes (Signed)
Reginald Waller:[id number] DOB: [birth year] DOA: 07/31/2013 PCP: Kirk Ruths, MD   Subjective: This man was admitted with significant anemia and dehydration/acute renal failure. He appears to have UTI. He is feeling somewhat better.           Physical Exam: Blood pressure 135/65, pulse 72, temperature 97.5 F (36.4 C), temperature source Oral, resp. rate 20, height 5\' 11"  (1.803 m), weight 64.003 kg (141 lb 1.6 oz), SpO2 90.00%. Pale. Hemodynamically stable. Lung fields are clear. Heart sounds present and normal. He does not look toxic or septic. He is alert. There are no focal neurological signs.   Investigations:  Recent Results (from the past 240 hour(s))  MRSA PCR SCREENING     Status: Abnormal   Collection Time    08/01/13  3:00 AM      Result Value Range Status   MRSA by PCR POSITIVE (*) NEGATIVE Final   Comment:            The GeneXpert MRSA Assay (FDA     approved for NASAL specimens     only), is one component of a     comprehensive MRSA colonization     surveillance program. It is not     intended to diagnose MRSA     infection nor to guide or     monitor treatment for     MRSA infections.     RESULT CALLED TO, READ BACK BY AND VERIFIED WITH:      FRANK,L @ 0514 ON 08/01/13 BY WOODIE,J     Basic Metabolic Panel:  Recent Labs  81/19/14 2103 08/01/13 0606  NA 133* 139  K 5.2* 4.5  CL 97 103  CO2 25 26  GLUCOSE 114* 84  BUN 73* 65*  CREATININE 3.02* 2.70*  CALCIUM 9.0 8.3*   Liver Function Tests:  Recent Labs  07/31/13 2103  AST 18  ALT 10  ALKPHOS 73  BILITOT 0.2*  PROT 7.3  ALBUMIN 3.2*     CBC:  Recent Labs  07/31/13 2103 08/01/13 0606  WBC 8.4 8.2  NEUTROABS 4.5  --   HGB 7.3* 7.0*  HCT 22.8* 22.4*  MCV 83.2 83.6  PLT 471* 437*    Dg Chest 1 View  07/31/2013   *RADIOLOGY REPORT*  Clinical Data: Neck pain, shoulder pain, back pain  CHEST - 1 VIEW  Comparison: 04/19/2013  Findings: Enlargement of cardiac silhouette.  Calcified tortuous aorta. Pulmonary vascular cephalization. Minimal chronic interstitial infiltrates question related to chronic failure or fibrosis. Aeration appears improved since previous exam likely representing rate improved edema. No pleural effusion or pneumothorax. Bones demineralized. Probable mixed lytic and sclerotic lesion of the proximal right humerus, appears present on previous exams.  IMPRESSION: Enlargement of cardiac silhouette with pulmonary vascular congestion. Scattered chronic interstitial infiltrates question related to chronic failure or fibrosis. No definite acute process.   Original Report Authenticated By: Ulyses Southward, M.D.   Ct Head Wo Contrast  07/31/2013    *RADIOLOGY REPORT*  Clinical Data:  Neck pain, stroke, hypertension, neck pain  CT HEAD WITHOUT CONTRAST CT CERVICAL SPINE WITHOUT CONTRAST  Technique:  Multidetector CT imaging of the head and cervical spine was performed following the standard protocol without intravenous contrast.  Multiplanar CT image reconstructions of the cervical spine were also generated.  Comparison:  Brain MRI 12/15/2012  CT HEAD  Findings: No acute intracranial hemorrhage.  No focal mass lesion. No CT evidence of acute infarction.   No midline shift or mass  effect.  No hydrocephalus.  Basilar cisterns are patent.  Extensive periventricular white matter hypodensities as well subcortical white matter hypodensities most severe in the right frontal temporal lobe.  Paranasal sinuses and mastoid air cells are clear.  Orbits are normal.  IMPRESSION:  1.  No acute intracranial findings. 2.  Severe atrophy and chronic white matter disease.  CT CERVICAL SPINE  Findings: No prevertebral soft tissue swelling.  Normal alignment cervical vertebral bodies.  There is loss of disc space at C6 and C7.  There is degenerative anterolisthesis of C3 on C4, C4-C5, C5 on C6.  There is degenerative in the ligaments posterior the dens with settling of the basion tip into the dens.   These are chronic findings.  No epidural paraspinal hematoma.  IMPRESSION:  1.  No acute findings cervical spine.  2.  Settling of the basion into the tip of the dens.  3.  Chronic changes within the transverse ligaments posterior to the dens.  4.   Multilevel disc osteophytic disease with degenerative mild subluxations throughout the cervical spine.   Original Report Authenticated By: Genevive Bi, M.D.   Ct Cervical Spine Wo Contrast  07/31/2013    *RADIOLOGY REPORT*  Clinical Data:  Neck pain, stroke, hypertension, neck pain  CT HEAD WITHOUT CONTRAST CT CERVICAL SPINE WITHOUT CONTRAST  Technique:  Multidetector CT imaging of the head and cervical spine was performed following the standard protocol without intravenous contrast.  Multiplanar CT image reconstructions of the cervical spine were also generated.  Comparison:  Brain MRI 12/15/2012  CT HEAD  Findings: No acute intracranial hemorrhage.  No focal mass lesion. No CT evidence of acute infarction.   No midline shift or mass effect.  No hydrocephalus.  Basilar cisterns are patent.  Extensive periventricular white matter hypodensities as well subcortical white matter hypodensities most severe in the right frontal temporal lobe.  Paranasal sinuses and mastoid air cells are clear.  Orbits are normal.  IMPRESSION:  1.  No acute intracranial findings. 2.  Severe atrophy and chronic white matter disease.  CT CERVICAL SPINE  Findings: No prevertebral soft tissue swelling.  Normal alignment cervical vertebral bodies.  There is loss of disc space at C6 and C7.  There is degenerative anterolisthesis of C3 on C4, C4-C5, C5 on C6.  There is degenerative in the ligaments posterior the dens with settling of the basion tip into the dens.  These are chronic findings.  No epidural paraspinal hematoma.  IMPRESSION:  1.  No acute findings cervical spine.  2.  Settling of the basion into the tip of the dens.  3.  Chronic changes within the transverse ligaments posterior to  the dens.  4.   Multilevel disc osteophytic disease with degenerative mild subluxations throughout the cervical spine.   Original Report Authenticated By: Genevive Bi, M.D.      Medications: I have reviewed the patient's current medications.  Impression: 1. Dehydration/acute renal failure, improving. 2. Acute on chronic renal failure secondary to #1. 3. Severe anemia of chronic disease/malignancy. 4. UTI.      Plan: 1. Give 2 units of blood today. 2. Continue with intravenous fluid hydration. 3. Continue with antibiotics.  Consultants:  None.   Procedures:  None.   Antibiotics:  Intravenous Rocephin started 07/31/2013.                   Code Status: DO NOT RESUSCITATE.  Family Communication: Discussed with patient at the bedside.   Disposition Plan: Depending on progress.  Time spent: 15 minutes.   LOS: 1 day   Wilson Singer Pager [phone]  08/01/2013, 10:01 AM

## 2013-08-02 DIAGNOSIS — D649 Anemia, unspecified: Secondary | ICD-10-CM

## 2013-08-02 DIAGNOSIS — C679 Malignant neoplasm of bladder, unspecified: Secondary | ICD-10-CM

## 2013-08-02 DIAGNOSIS — I10 Essential (primary) hypertension: Secondary | ICD-10-CM

## 2013-08-02 DIAGNOSIS — N179 Acute kidney failure, unspecified: Principal | ICD-10-CM

## 2013-08-02 DIAGNOSIS — E860 Dehydration: Secondary | ICD-10-CM

## 2013-08-02 DIAGNOSIS — N189 Chronic kidney disease, unspecified: Secondary | ICD-10-CM

## 2013-08-02 LAB — TYPE AND SCREEN: Antibody Screen: NEGATIVE

## 2013-08-02 LAB — CBC
MCH: 28 pg (ref 26.0–34.0)
MCHC: 33.1 g/dL (ref 30.0–36.0)
Platelets: 407 10*3/uL — ABNORMAL HIGH (ref 150–400)
RDW: 15.3 % (ref 11.5–15.5)

## 2013-08-02 LAB — BASIC METABOLIC PANEL
Calcium: 8.9 mg/dL (ref 8.4–10.5)
GFR calc Af Amer: 22 mL/min — ABNORMAL LOW (ref >90)
GFR calc non Af Amer: 19 mL/min — ABNORMAL LOW (ref >90)
Potassium: 4.9 mEq/L (ref 3.5–5.1)
Sodium: 137 mEq/L (ref 135–145)

## 2013-08-02 MED ORDER — ALBUTEROL SULFATE (5 MG/ML) 0.5% IN NEBU
2.5000 mg | INHALATION_SOLUTION | RESPIRATORY_TRACT | Status: DC | PRN
  Administered 2013-08-02: 2.5 mg via RESPIRATORY_TRACT
  Filled 2013-08-02: qty 0.5

## 2013-08-02 MED ORDER — OXYCODONE HCL 5 MG PO TABS
5.0000 mg | ORAL_TABLET | ORAL | Status: DC | PRN
  Administered 2013-08-02 (×3): 5 mg via ORAL
  Filled 2013-08-02 (×3): qty 1

## 2013-08-02 NOTE — Progress Notes (Signed)
Reginald Waller [id number]:[id number] DOB: [birth year] DOA: 07/31/2013 PCP: Kirk Ruths, MD   Subjective: This man was admitted with significant anemia and dehydration/acute renal failure. He appears to have UTI. He is feeling better than yesterday. He received 2 units of blood transfusion yesterday.           Physical Exam: Blood pressure 152/58, pulse 78, temperature 97.6 F (36.4 C), temperature source Oral, resp. rate 22, height 5\' 11"  (1.803 m), weight 69 kg (152 lb 1.9 oz), SpO2 93.00%. Looks better. Hemodynamically stable. Lung fields are clear. Heart sounds present and normal. He does not look toxic or septic. He is alert. There are no focal neurological signs.   Investigations:  Recent Results (from the past 240 hour(s))  MRSA PCR SCREENING     Status: Abnormal   Collection Time    08/01/13  3:00 AM      Result Value Range Status   MRSA by PCR POSITIVE (*) NEGATIVE Final   Comment:            The GeneXpert MRSA Assay (FDA     approved for NASAL specimens     only), is one component of a     comprehensive MRSA colonization     surveillance program. It is not     intended to diagnose MRSA     infection nor to guide or     monitor treatment for     MRSA infections.     RESULT CALLED TO, READ BACK BY AND VERIFIED WITH:      FRANK,L @ 0514 ON 08/01/13 BY WOODIE,J     Basic Metabolic Panel:  Recent Labs  64/40/34 0606 08/02/13 0617  NA 139 137  K 4.5 4.9  CL 103 103  CO2 26 22  GLUCOSE 84 88  BUN 65* 62*  CREATININE 2.70* 2.71*  CALCIUM 8.3* 8.9   Liver Function Tests:  Recent Labs  07/31/13 2103  AST 18  ALT 10  ALKPHOS 73  BILITOT 0.2*  PROT 7.3  ALBUMIN 3.2*     CBC:  Recent Labs  07/31/13 2103 08/01/13 0606 08/02/13 0617  WBC 8.4 8.2 11.2*  NEUTROABS 4.5  --   --   HGB 7.3* 7.0* 10.5*  HCT 22.8* 22.4* 31.7*  MCV 83.2 83.6 84.5  PLT 471* 437* 407*    Dg Chest 1 View  07/31/2013   *RADIOLOGY REPORT*  Clinical Data: Neck pain,  shoulder pain, back pain  CHEST - 1 VIEW  Comparison: 04/19/2013  Findings: Enlargement of cardiac silhouette. Calcified tortuous aorta. Pulmonary vascular cephalization. Minimal chronic interstitial infiltrates question related to chronic failure or fibrosis. Aeration appears improved since previous exam likely representing rate improved edema. No pleural effusion or pneumothorax. Bones demineralized. Probable mixed lytic and sclerotic lesion of the proximal right humerus, appears present on previous exams.  IMPRESSION: Enlargement of cardiac silhouette with pulmonary vascular congestion. Scattered chronic interstitial infiltrates question related to chronic failure or fibrosis. No definite acute process.   Original Report Authenticated By: Ulyses Southward, M.D.   Ct Head Wo Contrast  07/31/2013    *RADIOLOGY REPORT*  Clinical Data:  Neck pain, stroke, hypertension, neck pain  CT HEAD WITHOUT CONTRAST CT CERVICAL SPINE WITHOUT CONTRAST  Technique:  Multidetector CT imaging of the head and cervical spine was performed following the standard protocol without intravenous contrast.  Multiplanar CT image reconstructions of the cervical spine were also generated.  Comparison:  Brain MRI 12/15/2012  CT HEAD  Findings: No acute  intracranial hemorrhage.  No focal mass lesion. No CT evidence of acute infarction.   No midline shift or mass effect.  No hydrocephalus.  Basilar cisterns are patent.  Extensive periventricular white matter hypodensities as well subcortical white matter hypodensities most severe in the right frontal temporal lobe.  Paranasal sinuses and mastoid air cells are clear.  Orbits are normal.  IMPRESSION:  1.  No acute intracranial findings. 2.  Severe atrophy and chronic white matter disease.  CT CERVICAL SPINE  Findings: No prevertebral soft tissue swelling.  Normal alignment cervical vertebral bodies.  There is loss of disc space at C6 and C7.  There is degenerative anterolisthesis of C3 on C4, C4-C5, C5  on C6.  There is degenerative in the ligaments posterior the dens with settling of the basion tip into the dens.  These are chronic findings.  No epidural paraspinal hematoma.  IMPRESSION:  1.  No acute findings cervical spine.  2.  Settling of the basion into the tip of the dens.  3.  Chronic changes within the transverse ligaments posterior to the dens.  4.   Multilevel disc osteophytic disease with degenerative mild subluxations throughout the cervical spine.   Original Report Authenticated By: Genevive Bi, M.D.   Ct Cervical Spine Wo Contrast  07/31/2013    *RADIOLOGY REPORT*  Clinical Data:  Neck pain, stroke, hypertension, neck pain  CT HEAD WITHOUT CONTRAST CT CERVICAL SPINE WITHOUT CONTRAST  Technique:  Multidetector CT imaging of the head and cervical spine was performed following the standard protocol without intravenous contrast.  Multiplanar CT image reconstructions of the cervical spine were also generated.  Comparison:  Brain MRI 12/15/2012  CT HEAD  Findings: No acute intracranial hemorrhage.  No focal mass lesion. No CT evidence of acute infarction.   No midline shift or mass effect.  No hydrocephalus.  Basilar cisterns are patent.  Extensive periventricular white matter hypodensities as well subcortical white matter hypodensities most severe in the right frontal temporal lobe.  Paranasal sinuses and mastoid air cells are clear.  Orbits are normal.  IMPRESSION:  1.  No acute intracranial findings. 2.  Severe atrophy and chronic white matter disease.  CT CERVICAL SPINE  Findings: No prevertebral soft tissue swelling.  Normal alignment cervical vertebral bodies.  There is loss of disc space at C6 and C7.  There is degenerative anterolisthesis of C3 on C4, C4-C5, C5 on C6.  There is degenerative in the ligaments posterior the dens with settling of the basion tip into the dens.  These are chronic findings.  No epidural paraspinal hematoma.  IMPRESSION:  1.  No acute findings cervical spine.  2.   Settling of the basion into the tip of the dens.  3.  Chronic changes within the transverse ligaments posterior to the dens.  4.   Multilevel disc osteophytic disease with degenerative mild subluxations throughout the cervical spine.   Original Report Authenticated By: Genevive Bi, M.D.      Medications: I have reviewed the patient's current medications.  Impression: 1. Dehydration/acute renal failure, stable. 2. Acute on chronic renal failure secondary to #1. 3. Severe anemia of chronic disease/malignancy. Status post 2 units blood transfusion. 4. UTI.      Plan: 1. Increase IV fluid rate. 2. Await urine culture identification and sensitivities.  Consultants:  None.   Procedures:  None.   Antibiotics:  Intravenous Rocephin started 07/31/2013.  Code Status: DO NOT RESUSCITATE.  Family Communication: Discussed with patient at the bedside.   Disposition Plan: Depending on progress.  Time spent: 15 minutes.   LOS: 2 days   Wilson Singer Pager [phone]  08/02/2013, 10:06 AM

## 2013-08-03 ENCOUNTER — Inpatient Hospital Stay (HOSPITAL_COMMUNITY): Payer: Medicare Other

## 2013-08-03 DIAGNOSIS — R0602 Shortness of breath: Secondary | ICD-10-CM

## 2013-08-03 DIAGNOSIS — I5031 Acute diastolic (congestive) heart failure: Secondary | ICD-10-CM

## 2013-08-03 DIAGNOSIS — N390 Urinary tract infection, site not specified: Secondary | ICD-10-CM

## 2013-08-03 LAB — CBC
Hemoglobin: 10.1 g/dL — ABNORMAL LOW (ref 13.0–17.0)
MCH: 27.3 pg (ref 26.0–34.0)
Platelets: 387 10*3/uL (ref 150–400)
RBC: 3.7 MIL/uL — ABNORMAL LOW (ref 4.22–5.81)
WBC: 10.4 10*3/uL (ref 4.0–10.5)

## 2013-08-03 LAB — URINALYSIS, ROUTINE W REFLEX MICROSCOPIC
Bilirubin Urine: NEGATIVE
Nitrite: NEGATIVE
Specific Gravity, Urine: 1.01 (ref 1.005–1.030)
Urobilinogen, UA: 0.2 mg/dL (ref 0.0–1.0)

## 2013-08-03 LAB — BASIC METABOLIC PANEL
CO2: 20 mEq/L (ref 19–32)
Calcium: 8.9 mg/dL (ref 8.4–10.5)
Chloride: 106 mEq/L (ref 96–112)
Glucose, Bld: 83 mg/dL (ref 70–99)
Potassium: 4.4 mEq/L (ref 3.5–5.1)
Sodium: 137 mEq/L (ref 135–145)

## 2013-08-03 LAB — URINE MICROSCOPIC-ADD ON

## 2013-08-03 MED ORDER — FUROSEMIDE 10 MG/ML IJ SOLN
80.0000 mg | Freq: Two times a day (BID) | INTRAMUSCULAR | Status: DC
  Administered 2013-08-03 – 2013-08-05 (×6): 80 mg via INTRAVENOUS
  Filled 2013-08-03 (×6): qty 8

## 2013-08-03 NOTE — Progress Notes (Signed)
Reginald Waller ZOX:[id number] DOB: [birth year] DOA: 07/31/2013 PCP: Kirk Ruths, MD   Subjective: This man was admitted with significant anemia and dehydration/acute renal failure. He appears to have UTI. He is currently gram-negative rods. Unfortunately, he has been desaturating and is now 10 L oxygen per minute. This apparently occurred overnight but there is no documentation of this.           Physical Exam: Blood pressure 173/49, pulse 78, temperature 98.5 F (36.9 C), temperature source Oral, resp. rate 20, height 5\' 11"  (1.803 m), weight 71.9 kg (158 lb 8.2 oz), SpO2 92.00%. Dyspneic at rest. Hemodynamically stable. Lung fields show wheezing. Minimal crackles. Heart sounds present and normal. He does not look toxic or septic. He is alert. There are no focal neurological signs.   Investigations:  Recent Results (from the past 240 hour(s))  URINE CULTURE     Status: None   Collection Time    07/31/13  9:21 PM      Result Value Range Status   Specimen Description URINE, CLEAN CATCH   Final   Special Requests NONE   Final   Culture  Setup Time     Final   Value: 08/01/2013 19:59     Performed at [facility] Count     Final   Value: >=100,000 COLONIES/ML     Performed at [facility]   Culture     Final   Value: GRAM NEGATIVE RODS     Performed at [facility]   Report Status PENDING   Incomplete  MRSA PCR SCREENING     Status: Abnormal   Collection Time    08/01/13  3:00 AM      Result Value Range Status   MRSA by PCR POSITIVE (*) NEGATIVE Final   Comment:            The GeneXpert MRSA Assay (FDA     approved for NASAL specimens     only), is one component of a     comprehensive MRSA colonization     surveillance program. It is not     intended to diagnose MRSA     infection nor to guide or     monitor treatment for     MRSA infections.     RESULT CALLED TO, READ BACK BY AND VERIFIED WITH:      FRANK,L @ 0514 ON 08/01/13  BY WOODIE,J     Basic Metabolic Panel:  Recent Labs  81/19/14 0617 08/03/13 0510  NA 137 137  K 4.9 4.4  CL 103 106  CO2 22 20  GLUCOSE 88 83  BUN 62* 55*  CREATININE 2.71* 2.37*  CALCIUM 8.9 8.9   Liver Function Tests:  Recent Labs  07/31/13 2103  AST 18  ALT 10  ALKPHOS 73  BILITOT 0.2*  PROT 7.3  ALBUMIN 3.2*     CBC:  Recent Labs  07/31/13 2103  08/02/13 0617 08/03/13 0510  WBC 8.4  < > 11.2* 10.4  NEUTROABS 4.5  --   --   --   HGB 7.3*  < > 10.5* 10.1*  HCT 22.8*  < > 31.7* 31.3*  MCV 83.2  < > 84.5 84.6  PLT 471*  < > 407* 387  < > = values in this interval not displayed.  No results found.    Medications: I have reviewed the patient's current medications.  Impression: 1. Dehydration, clinically resolved. 2. Acute on chronic renal failure  secondary to #1. 3. Severe anemia of chronic disease/malignancy. Status post 2 units blood transfusion. 4. UTI. Gram-negative rods. 5. Respiratory failure, unclear etiology. I think he is probably fluid overloaded.     Plan: 1. IV fluids to [facility]. 2. Intravenous Lasix 80 mg IV twice a day. 3. Stat chest x-ray. 4. Nephrology consultation.  Consultants:  None.   Procedures:  None.   Antibiotics:  Intravenous Rocephin started 07/31/2013.                   Code Status: DO NOT RESUSCITATE.  Family Communication: Discussed with patient at the bedside.   Disposition Plan: Depending on progress.  Time spent: 15 minutes.   LOS: 3 days   Wilson Singer Pager [phone]  08/03/2013, 9:58 AM

## 2013-08-03 NOTE — Progress Notes (Signed)
Respiratory Care Note: open this pt's chart looking for a respiratory order that the pt's RN called me about.

## 2013-08-03 NOTE — Consult Note (Signed)
Reason for Consult:worsoning of renal failure Referring Physician: Dr Reginald Waller is an 79 y.o. male.  HPI: Patient with history of hypertension, CVA, bladder cancer and chronic renal failure stage IV presently patient came with complaints of neck pain with some radiation to his back and also his arm. When patient was evaluated his BUN and creatinine was found to be high. Presently patient claims his pain seems to be getting better but has difficulty in breathing. He has cough with some sputum production for the last couple of days. Patient denies any fever however he claims has chills. He complains of feeling cold. Presently his appetite is good and he doesn't have any nausea or vomiting.  Past Medical History  Diagnosis Date  . Hypertension   . Arthritis   . Stroke   . Malaria   . Urothelial cancer 12/16/2012  . Anemia   . Hyperlipidemia     Past Surgical History  Procedure Laterality Date  . Hernia repair    . Cataract extraction    . Appendectomy    . Eye surgery    . Cystoscopy  12/08/2012    Procedure: CYSTOSCOPY FLEXIBLE;  Surgeon: Ky Barban, MD;  Location: [facility];  Service: Urology;  Laterality: N/A;  . Transurethral resection of bladder tumor  12/09/2012    Procedure: TRANSURETHRAL RESECTION OF BLADDER TUMOR (TURBT);  Surgeon: Ky Barban, MD;  Location: [facility];  Service: Urology;  Laterality: N/A;    Family History  Problem Relation Age of Onset  . Diabetes Mother   . Diabetes Father   . Cancer Sister     breast  . Cancer Sister     thyroid  . Cancer Brother     leukemia  . Cancer Brother   . Coronary artery disease Brother     Social History:  reports that he has quit smoking. He has quit using smokeless tobacco. He reports that he does not drink alcohol or use illicit drugs.  Allergies:  Allergies  Allergen Reactions  . Shellfish Allergy Other (See Comments)    Swelling of throat and tongue.    Medications: I have reviewed the  patient's current medications.  Results for orders placed during the hospital encounter of 07/31/13 (from the past 48 hour(s))  CBC     Status: Abnormal   Collection Time    08/02/13  6:17 AM      Result Value Range   WBC 11.2 (*) 4.0 - 10.5 K/uL   RBC 3.75 (*) 4.22 - 5.81 MIL/uL   Hemoglobin 10.5 (*) 13.0 - 17.0 g/dL   Comment: DELTA CHECK NOTED     POST TRANSFUSION SPECIMEN   HCT 31.7 (*) 39.0 - 52.0 %   MCV 84.5  78.0 - 100.0 fL   MCH 28.0  26.0 - 34.0 pg   MCHC 33.1  30.0 - 36.0 g/dL   RDW 16.1  09.6 - 04.5 %   Platelets 407 (*) 150 - 400 K/uL  BASIC METABOLIC PANEL     Status: Abnormal   Collection Time    08/02/13  6:17 AM      Result Value Range   Sodium 137  135 - 145 mEq/L   Potassium 4.9  3.5 - 5.1 mEq/L   Chloride 103  96 - 112 mEq/L   CO2 22  19 - 32 mEq/L   Glucose, Bld 88  70 - 99 mg/dL   BUN 62 (*) 6 - 23 mg/dL   Creatinine,  Ser 2.71 (*) 0.50 - 1.35 mg/dL   Calcium 8.9  8.4 - 04.5 mg/dL   GFR calc non Af Amer 19 (*) >90 mL/min   GFR calc Af Amer 22 (*) >90 mL/min   Comment: (NOTE)     The eGFR has been calculated using the CKD EPI equation.     This calculation has not been validated in all clinical situations.     eGFR's persistently <90 mL/min signify possible Chronic Kidney     Disease.  CBC     Status: Abnormal   Collection Time    08/03/13  5:10 AM      Result Value Range   WBC 10.4  4.0 - 10.5 K/uL   RBC 3.70 (*) 4.22 - 5.81 MIL/uL   Hemoglobin 10.1 (*) 13.0 - 17.0 g/dL   HCT 40.9 (*) 81.1 - 91.4 %   MCV 84.6  78.0 - 100.0 fL   MCH 27.3  26.0 - 34.0 pg   MCHC 32.3  30.0 - 36.0 g/dL   RDW 78.2 (*) 95.6 - 21.3 %   Platelets 387  150 - 400 K/uL  BASIC METABOLIC PANEL     Status: Abnormal   Collection Time    08/03/13  5:10 AM      Result Value Range   Sodium 137  135 - 145 mEq/L   Potassium 4.4  3.5 - 5.1 mEq/L   Chloride 106  96 - 112 mEq/L   CO2 20  19 - 32 mEq/L   Glucose, Bld 83  70 - 99 mg/dL   BUN 55 (*) 6 - 23 mg/dL   Creatinine,  Ser 0.86 (*) 0.50 - 1.35 mg/dL   Calcium 8.9  8.4 - 57.8 mg/dL   GFR calc non Af Amer 22 (*) >90 mL/min   GFR calc Af Amer 26 (*) >90 mL/min   Comment: (NOTE)     The eGFR has been calculated using the CKD EPI equation.     This calculation has not been validated in all clinical situations.     eGFR's persistently <90 mL/min signify possible Chronic Kidney     Disease.    Dg Chest Port 1 View  08/03/2013   *RADIOLOGY REPORT*  Clinical Data: Dyspnea  PORTABLE CHEST - 1 VIEW  Comparison: 07/31/2013  Findings: The cardiac shadow is stable.  Vascular congestion with bilateral infiltrative changes are seen. These have progressed significantly in the interval from prior exam.  There is likely a left-sided pleural effusion present as well.  IMPRESSION: Rapid increase in bilateral infiltrative densities. This may be related to pulmonary edema.  Clinical correlation is recommended.   Original Report Authenticated By: Alcide Clever, M.D.    Review of Systems  Constitutional: Positive for chills. Negative for fever.  HENT: Positive for congestion, sore throat and neck pain.   Respiratory: Positive for cough, shortness of breath and wheezing.   Cardiovascular: Positive for orthopnea.  Gastrointestinal: Negative for nausea, vomiting and abdominal pain.  Musculoskeletal: Positive for back pain and joint pain.   Blood pressure 168/54, pulse 78, temperature 98.6 F (37 C), temperature source Oral, resp. rate 20, height 5\' 11"  (1.803 m), weight 71.9 kg (158 lb 8.2 oz), SpO2 94.00%. Physical Exam  Eyes: No scleral icterus.  Neck: No JVD present.  Cardiovascular: Normal rate.   Murmur heard. Respiratory: No respiratory distress. He has wheezes. He has rales.  GI: He exhibits no distension. There is no tenderness.  Musculoskeletal: He exhibits no edema.  Neurological:  He is alert.  Skin: Skin is warm.    Assessment/Plan: Problem #1 renal failure presently seems to be acute on chronic. His creatinine  was 1.88 on 6/19 2009 and increased to 2 on 03/2013 with EGFR of 26 cc per minute. His ultrasound which was done in 2009 showed his right kidney to be 10.4 and left kidney 11.6 with some cortical thinning consistent with underlying chronic renal failure. The present increasing in BUN and creatinine could be secondary to natural progression of the disease however renal syndrome and ATN cannot ruled out. Since patient was found to have severe left hydro-nephrosis in 12/04/2012 superimposed on obstructive uropathy may play some role. Presently patient doesn't have any uremic signs and symptoms and his potassium is normal. Problem #2 anemia patient has history of iron deficiency anemia from before. At this moment cannot rule out he him off chronic kidney disease. Problem #3 difficulty in breathing associated with cough and sputum production. His chest x-ray showed bilateral infiltrate. This could be secondary to aspiration pneumonia versus CHF. Presently patient is none oliguric. Problem #4 history of bladder cancer. Problem #5 history of hypertension his blood pressure seems to be reasonably controlled Problem #6 hypothyroidism  Problem #7 metabolic bone disease his calcium is was in acceptable range. Problem #8 history of UTI. Recommendation: I did with present management and diuretic use. We'll check his basic metabolic panel, phosphorus, intact PTH and 25 vitamin D level. We'll do also ultrasound of the kidney to rule out obstruction. We'll check iron studies also in the morning.  Reginald Waller S 08/03/2013, 3:13 PM

## 2013-08-03 NOTE — Plan of Care (Signed)
Problem: Phase I Progression Outcomes Goal: Voiding-avoid urinary catheter unless indicated Outcome: Not Met (add Reason) Chronic cath     

## 2013-08-03 NOTE — Progress Notes (Signed)
Patient's 02 sat on room air is 83%, placed on vent mask and 02 sat is at 90%.

## 2013-08-03 NOTE — Clinical Social Work Psychosocial (Signed)
Clinical Social Work Department BRIEF PSYCHOSOCIAL ASSESSMENT 08/03/2013  Patient:  Reginald Waller, Reginald Waller     Account Number:  [id number]     Admit date:  07/31/2013  Clinical Social Worker:  Nancie Neas  Date/Time:  08/03/2013 11:25 AM  Referred by:  Physician  Date Referred:  08/03/2013 Referred for  ALF Placement   Other Referral:   Interview type:  Patient Other interview type:   niece- Burna Mortimer    PSYCHOSOCIAL DATA Living Status:  FACILITY Admitted from facility:  [facility] Level of care:  Assisted Living Primary support name:  Burna Mortimer Primary support relationship to patient:  FAMILY Degree of support available:   supportive    CURRENT CONCERNS Current Concerns  Post-Acute Placement   Other Concerns:    SOCIAL WORK ASSESSMENT / PLAN CSW met with pt and pt's niece, Burna Mortimer at bedside. Pt alert and oriented. He has been a resident at [facility] for about 6 months and is known to CSW from previous admission. Pt's nieces are best support and assist as needed. Pt's wife is also a resident at [facility]. He has a private room and was just taken off hospice services about 2 weeks ago. Pt has oxygen PRN. Per Marchelle Folks at facility, pt is a one person extensive assist, but can feed himself. He generally uses a wheelchair. Facility was getting ready to start home health PT/RN for pt. Marchelle Folks would like to assess pt prior to d/c, but are hoping to bring pt back in.   Assessment/plan status:  Psychosocial Support/Ongoing Assessment of Needs Other assessment/ plan:   Information/referral to community resources:   [facility]    PATIENT'S/FAMILY'S RESPONSE TO PLAN OF CARE: Pt and family report positive feelings regarding return to [facility] when medically stable. Niece has brought pt's wife up to visit over wekeend. CSW will continue to follow.       Derenda Fennel, [place] [phone]

## 2013-08-04 ENCOUNTER — Inpatient Hospital Stay (HOSPITAL_COMMUNITY): Payer: Medicare Other

## 2013-08-04 DIAGNOSIS — J810 Acute pulmonary edema: Secondary | ICD-10-CM

## 2013-08-04 LAB — BASIC METABOLIC PANEL
BUN: 57 mg/dL — ABNORMAL HIGH (ref 6–23)
CO2: 24 mEq/L (ref 19–32)
Chloride: 103 mEq/L (ref 96–112)
Creatinine, Ser: 2.44 mg/dL — ABNORMAL HIGH (ref 0.50–1.35)
Glucose, Bld: 107 mg/dL — ABNORMAL HIGH (ref 70–99)
Potassium: 4 mEq/L (ref 3.5–5.1)

## 2013-08-04 LAB — CBC
HCT: 31.8 % — ABNORMAL LOW (ref 39.0–52.0)
Hemoglobin: 10.3 g/dL — ABNORMAL LOW (ref 13.0–17.0)
MCH: 27.4 pg (ref 26.0–34.0)
MCHC: 32.4 g/dL (ref 30.0–36.0)
MCV: 84.6 fL (ref 78.0–100.0)
RDW: 15.9 % — ABNORMAL HIGH (ref 11.5–15.5)

## 2013-08-04 LAB — TROPONIN I
Troponin I: <0.3 ng/mL (ref <0.30)
Troponin I: <0.3 ng/mL (ref <0.30)
Troponin I: <0.3 ng/mL (ref <0.30)

## 2013-08-04 LAB — PHOSPHORUS: Phosphorus: 4.1 mg/dL (ref 2.3–4.6)

## 2013-08-04 LAB — URINE CULTURE: Colony Count: >=100000

## 2013-08-04 LAB — PTH, INTACT AND CALCIUM
Calcium, Total (PTH): 8.2 mg/dL — ABNORMAL LOW (ref 8.4–10.5)
PTH: 97.2 pg/mL — ABNORMAL HIGH (ref 14.0–72.0)

## 2013-08-04 LAB — FERRITIN: Ferritin: 69 ng/mL (ref 22–322)

## 2013-08-04 LAB — IRON AND TIBC: TIBC: 204 ug/dL — ABNORMAL LOW (ref 215–435)

## 2013-08-04 MED ORDER — SULFAMETHOXAZOLE-TMP DS 800-160 MG PO TABS
1.0000 | ORAL_TABLET | Freq: Every day | ORAL | Status: DC
  Administered 2013-08-04: 1 via ORAL
  Filled 2013-08-04: qty 1

## 2013-08-04 MED ORDER — VANCOMYCIN HCL IN DEXTROSE 750-5 MG/150ML-% IV SOLN: 750.0000 mg | INTRAVENOUS | Status: DC

## 2013-08-04 MED ORDER — SULFAMETHOXAZOLE-TMP DS 800-160 MG PO TABS: 1.0000 | ORAL_TABLET | Freq: Every day | ORAL | Status: DC

## 2013-08-04 MED ORDER — VANCOMYCIN HCL IN DEXTROSE 1-5 GM/200ML-% IV SOLN
INTRAVENOUS | Status: AC
  Filled 2013-08-04: qty 200

## 2013-08-04 MED ORDER — SULFAMETHOXAZOLE-TRIMETHOPRIM 400-80 MG/5ML IV SOLN
320.0000 mg | Freq: Once | INTRAVENOUS | Status: AC
  Administered 2013-08-04: 320 mg via INTRAVENOUS
  Filled 2013-08-04: qty 20

## 2013-08-04 MED ORDER — VANCOMYCIN HCL IN DEXTROSE 750-5 MG/150ML-% IV SOLN
750.0000 mg | INTRAVENOUS | Status: DC
  Administered 2013-08-06: 750 mg via INTRAVENOUS
  Filled 2013-08-04: qty 150

## 2013-08-04 MED ORDER — VANCOMYCIN HCL IN DEXTROSE 1-5 GM/200ML-% IV SOLN
1000.0000 mg | Freq: Once | INTRAVENOUS | Status: AC
  Administered 2013-08-04: 1000 mg via INTRAVENOUS
  Filled 2013-08-04: qty 200

## 2013-08-04 NOTE — Progress Notes (Signed)
ANTIBIOTIC CONSULT NOTE-Preliminary  Pharmacy Consult for vancomycin and Septra Indication: UTI  Allergies  Allergen Reactions  . Shellfish Allergy Other (See Comments)    Swelling of throat and tongue.    Patient Measurements: Height: 5\' 11"  (180.3 cm) Weight: 158 lb 8.2 oz (71.9 kg) IBW/kg (Calculated) : 75.3  Vital Signs: Temp: 98 F (36.7 C) (09/09 0131) Temp src: Oral (09/09 0131) BP: 169/66 mmHg (09/09 0131) Pulse Rate: 69 (09/09 0131)  Labs:  Recent Labs  08/01/13 0606 08/02/13 0617 08/03/13 0510  WBC 8.2 11.2* 10.4  HGB 7.0* 10.5* 10.1*  PLT 437* 407* 387  CREATININE 2.70* 2.71* 2.37*    Estimated Creatinine Clearance: 20.2 ml/min (by C-G formula based on Cr of 2.37).   Microbiology: Recent Results (from the past 720 hour(s))  URINE CULTURE     Status: None   Collection Time    07/31/13  9:21 PM      Result Value Range Status   Specimen Description URINE, CLEAN CATCH   Final   Special Requests NONE   Final   Culture  Setup Time     Final   Value: 08/01/2013 19:59     Performed at [facility] Count     Final   Value: >=100,000 COLONIES/ML     Performed at [facility]   Culture     Final   Value: STENOTROPHOMONAS MALTOPHILIA     METHICILLIN RESISTANT STAPHYLOCOCCUS AUREUS     Note: RIFAMPIN AND GENTAMICIN SHOULD NOT BE USED AS SINGLE DRUGS FOR TREATMENT OF STAPH INFECTIONS.     Performed at [facility]   Report Status 08/04/2013 FINAL   Final   Organism ID, Bacteria STENOTROPHOMONAS MALTOPHILIA   Final   Organism ID, Bacteria METHICILLIN RESISTANT STAPHYLOCOCCUS AUREUS   Final  MRSA PCR SCREENING     Status: Abnormal   Collection Time    08/01/13  3:00 AM      Result Value Range Status   MRSA by PCR POSITIVE (*) NEGATIVE Final   Comment:            The GeneXpert MRSA Assay (FDA     approved for NASAL specimens     only), is one component of a     comprehensive MRSA colonization     surveillance program.  It is not     intended to diagnose MRSA     infection nor to guide or     monitor treatment for     MRSA infections.     RESULT CALLED TO, READ BACK BY AND VERIFIED WITH:      FRANK,L @ 0514 ON 08/01/13 BY WOODIE,J    Medical History: Past Medical History  Diagnosis Date  . Hypertension   . Arthritis   . Stroke   . Malaria   . Urothelial cancer 12/16/2012  . Anemia   . Hyperlipidemia     Assessment: Pt is a 79 yo male being initiated on vancomycin and Septra for UTI. Culture yielded MRSA and Stenotrophomonas with sensitivities to vancomycin (MIC=1) and Septra respectively.   Goal of Therapy:  Vancomycin trough level 15-20 mcg/ml  Plan: Preliminary review of pertinent patient information completed.  Protocol will be initiated with a one-time dose(s) of Vancomycin 1000 mg and Septra IV 320 mg (trimethoprim component) .  Jeani Hawking clinical pharmacist will complete review during morning rounds to assess patient and finalize treatment regimen.  Lenore Manner [place], [facility] 08/04/2013,2:00 AM

## 2013-08-04 NOTE — Progress Notes (Addendum)
Reginald Waller ZOX:[id number] DOB: August 08, 1919 DOA: 07/31/2013 PCP: Kirk Ruths, MD   Subjective: This man was admitted with significant anemia and dehydration/acute renal failure. He appears to have UTI. He is currently gram-negative rods. Unfortunately, he went into pulmonary edema yesterday, likely secondary to fluid overload. He denies any chest pain. He is improved today clinically and has had significant diuresis. Urine culture is positive for 2 organisms, one of which is MRSA and the other is STENOTROPHOMONAS MALTOPHILIA -his antibiotics have been adjusted to reflect sensitivity patterns.            Physical Exam: Blood pressure 155/59, pulse 67, temperature 97.8 F (36.6 C), temperature source Oral, resp. rate 20, height 5\' 11"  (1.803 m), weight 69.3 kg (152 lb 12.5 oz), SpO2 98.00%. Dyspneic at rest. Hemodynamically stable. Lung fields improved aeration in both lung fields with a few crackles and some wheezing bilaterally. Heart sounds present and normal. He does not look toxic or septic. He is alert. There are no focal neurological signs.   Investigations:  Recent Results (from the past 240 hour(s))  URINE CULTURE     Status: None   Collection Time    07/31/13  9:21 PM      Result Value Range Status   Specimen Description URINE, CLEAN CATCH   Final   Special Requests NONE   Final   Culture  Setup Time     Final   Value: 08/01/2013 19:59     Performed at [facility] Count     Final   Value: >=100,000 COLONIES/ML     Performed at [facility]   Culture     Final   Value: STENOTROPHOMONAS MALTOPHILIA     METHICILLIN RESISTANT STAPHYLOCOCCUS AUREUS     Note: RIFAMPIN AND GENTAMICIN SHOULD NOT BE USED AS SINGLE DRUGS FOR TREATMENT OF STAPH INFECTIONS.     Performed at [facility]   Report Status 08/04/2013 FINAL   Final   Organism ID, Bacteria STENOTROPHOMONAS MALTOPHILIA   Final   Organism ID, Bacteria METHICILLIN RESISTANT  STAPHYLOCOCCUS AUREUS   Final  MRSA PCR SCREENING     Status: Abnormal   Collection Time    08/01/13  3:00 AM      Result Value Range Status   MRSA by PCR POSITIVE (*) NEGATIVE Final   Comment:            The GeneXpert MRSA Assay (FDA     approved for NASAL specimens     only), is one component of a     comprehensive MRSA colonization     surveillance program. It is not     intended to diagnose MRSA     infection nor to guide or     monitor treatment for     MRSA infections.     RESULT CALLED TO, READ BACK BY AND VERIFIED WITH:      FRANK,L @ 0514 ON 08/01/13 BY WOODIE,J     Basic Metabolic Panel:  Recent Labs  81/19/14 0510 08/04/13 0459  NA 137 136  K 4.4 4.0  CL 106 103  CO2 20 24  GLUCOSE 83 107*  BUN 55* 57*  CREATININE 2.37* 2.44*  CALCIUM 8.9 8.9  PHOS  --  4.1   Liver Function Tests: No results found for this basename: AST, ALT, ALKPHOS, BILITOT, PROT, ALBUMIN,  in the last 72 hours   CBC:  Recent Labs  08/03/13 0510 08/04/13 0459  WBC 10.4  9.6  HGB 10.1* 10.3*  HCT 31.3* 31.8*  MCV 84.6 84.6  PLT 387 389    US Renal  08/03/2013   CLINICAL DATA:  79 year old male with worsening renal failure.  EXAM: RENAL/URINARY TRACT ULTRASOUND COMPLETE  COMPARISON:  CT Abdomen and Pelvis 12/04/2012.  FINDINGS: Right Kidney: No hydronephrosis. Renal length 9.7 cm. Increased cortical echogenicity. Cortical thickness preserved.  Left Kidney: Moderate to severe hydronephrosis, appears not significantly changed since comparison. A degree of cortical thinning now suspected.  Bladder:  Decompressed, with a Foley catheter balloon visible.  IMPRESSION: 1. Continued left hydronephrosis, previously suspected to be related to an obstructing soft tissue lesion at the level of the bladder. Clinical correlation recommended.  2. No right hydronephrosis, but increased right renal echotexture suggesting superimposed chronic medical renal disease.   Electronically Signed   By: Augusto Gamble  M.D.   On: 08/03/2013 16:12   Dg Chest Port 1 View  08/03/2013   *RADIOLOGY REPORT*  Clinical Data: Dyspnea  PORTABLE CHEST - 1 VIEW  Comparison: 07/31/2013  Findings: The cardiac shadow is stable.  Vascular congestion with bilateral infiltrative changes are seen. These have progressed significantly in the interval from prior exam.  There is likely a left-sided pleural effusion present as well.  IMPRESSION: Rapid increase in bilateral infiltrative densities. This may be related to pulmonary edema.  Clinical correlation is recommended.   Original Report Authenticated By: Alcide Clever, M.D.      Medications: I have reviewed the patient's current medications.  Impression: 1. Dehydration, clinically resolved. 2. Acute on chronic renal failure secondary to #1. 3. Severe anemia of chronic disease/malignancy. Status post 2 units blood transfusion. 4. UTI with 2 organisms described above. 5. Pulmonary edema secondary to fluid overload.     Plan: 1. Repeat chest x-ray today. 2. Try to wean down oxygen requirement. We may need to increase intravenous Lasix if his chest x-ray has not shown a significant improvement..  Consultants:  None.   Procedures:  None.   Antibiotics:  Intravenous Rocephin started 07/31/2013. Discontinued 08/04/2013.  Intravenous vancomycin started 08/04/2013.  Oral Bactrim DS started 08/04/2013.                  Code Status: DO NOT RESUSCITATE.  Family Communication: Discussed with patient at the bedside.   Disposition Plan: Depending on progress.  Time spent: 15 minutes.   LOS: 4 days   Wilson Singer Pager [phone]  08/04/2013, 10:27 AM

## 2013-08-04 NOTE — Progress Notes (Signed)
ANTIBIOTIC CONSULT NOTE - FOLLOW UP  Pharmacy Consult for Vancomycin and Septra Indication: UTI  Allergies  Allergen Reactions  . Shellfish Allergy Other (See Comments)    Swelling of throat and tongue.   Patient Measurements: Height: 5\' 11"  (180.3 cm) Weight: 152 lb 12.5 oz (69.3 kg) IBW/kg (Calculated) : 75.3  Vital Signs: Temp: 97.8 F (36.6 C) (09/09 0402) Temp src: Oral (09/09 0402) BP: 155/59 mmHg (09/09 0402) Pulse Rate: 67 (09/09 0402) Intake/Output from previous day: 09/08 0701 - 09/09 0700 In: 1401.3 [I.V.:701.3; IV Piggyback:700] Out: 3150 [Urine:3150] Intake/Output from this shift:    Labs:  Recent Labs  08/02/13 0617 08/03/13 0510 08/04/13 0459  WBC 11.2* 10.4 9.6  HGB 10.5* 10.1* 10.3*  PLT 407* 387 389  CREATININE 2.71* 2.37* 2.44*   Estimated Creatinine Clearance: 18.9 ml/min (by C-G formula based on Cr of 2.44). No results found for this basename: Rolm Gala, VANCORANDOM, GENTTROUGH, GENTPEAK, GENTRANDOM, TOBRATROUGH, TOBRAPEAK, TOBRARND, AMIKACINPEAK, AMIKACINTROU, AMIKACIN,  in the last 72 hours   Microbiology: Recent Results (from the past 720 hour(s))  URINE CULTURE     Status: None   Collection Time    07/31/13  9:21 PM      Result Value Range Status   Specimen Description URINE, CLEAN CATCH   Final   Special Requests NONE   Final   Culture  Setup Time     Final   Value: 08/01/2013 19:59     Performed at [facility] Count     Final   Value: >=100,000 COLONIES/ML     Performed at [facility]   Culture     Final   Value: STENOTROPHOMONAS MALTOPHILIA     METHICILLIN RESISTANT STAPHYLOCOCCUS AUREUS     Note: RIFAMPIN AND GENTAMICIN SHOULD NOT BE USED AS SINGLE DRUGS FOR TREATMENT OF STAPH INFECTIONS.     Performed at [facility]   Report Status 08/04/2013 FINAL   Final   Organism ID, Bacteria STENOTROPHOMONAS MALTOPHILIA   Final   Organism ID, Bacteria METHICILLIN RESISTANT STAPHYLOCOCCUS  AUREUS   Final  MRSA PCR SCREENING     Status: Abnormal   Collection Time    08/01/13  3:00 AM      Result Value Range Status   MRSA by PCR POSITIVE (*) NEGATIVE Final   Comment:            The GeneXpert MRSA Assay (FDA     approved for NASAL specimens     only), is one component of a     comprehensive MRSA colonization     surveillance program. It is not     intended to diagnose MRSA     infection nor to guide or     monitor treatment for     MRSA infections.     RESULT CALLED TO, READ BACK BY AND VERIFIED WITH:      FRANK,L @ 0514 ON 08/01/13 BY WOODIE,J    Anti-infectives   Start     Dose/Rate Route Frequency Ordered Stop   08/05/13 2200  vancomycin (VANCOCIN) IVPB 750 mg/150 ml premix     750 mg 150 mL/hr over 60 Minutes Intravenous Every 48 hours 08/04/13 0805     08/05/13 1000  sulfamethoxazole-trimethoprim (BACTRIM DS) 800-160 MG per tablet 1 tablet     1 tablet Oral Daily 08/04/13 0806     08/04/13 1000  sulfamethoxazole-trimethoprim (BACTRIM DS) 800-160 MG per tablet 1 tablet  Status:  Discontinued  1 tablet Oral Daily 08/04/13 0743 08/04/13 0806   08/04/13 0400  sulfamethoxazole-trimethoprim (BACTRIM) 320 mg of trimethoprim in dextrose 5 % 500 mL IVPB     320 mg of trimethoprim 346.7 mL/hr over 90 Minutes Intravenous  Once 08/04/13 0227 08/04/13 0625   08/04/13 0300  vancomycin (VANCOCIN) IVPB 1000 mg/200 mL premix     1,000 mg 200 mL/hr over 60 Minutes Intravenous  Once 08/04/13 0227 08/04/13 0419   07/31/13 2230  cefTRIAXone (ROCEPHIN) 1 g in dextrose 5 % 50 mL IVPB     1 g 100 mL/hr over 30 Minutes Intravenous  Once 07/31/13 2229 07/31/13 2352     Specimen Description URINE, CLEAN CATCH    Special Requests NONE    Culture Setup Time 08/01/2013 19:59 Performed at [facility] Count >=100,000 COLONIES/ML Performed at [facility] STENOTROPHOMONAS MALTOPHILIA METHICILLIN RESISTANT STAPHYLOCOCCUS AUREUS Note: RIFAMPIN AND  GENTAMICIN SHOULD NOT BE USED AS SINGLE DRUGS FOR TREATMENT OF STAPH INFECTIONS. Performed at [facility]   Report Status 08/04/2013 FINAL    Organism ID, Bacteria STENOTROPHOMONAS MALTOPHILIA    Organism ID, Bacteria METHICILLIN RESISTANT STAPHYLOCOCCUS AUREUS   Resulting Agency [facility]   Culture & Susceptibility    Antibiotic  Organism Organism Organism     METHICILLIN RESISTANT STAPHYLOCOCCUS AUREUS STENOTROPHOMONAS MALTOPHILIA    GENTAMICIN  <=0.5 SENSITIVE S Final       LEVOFLOXACIN  >=8 RESISTANT R Final 1 SENSITIVE S Final     NITROFURANTOIN  <=16 SENSITIVE S Final       OXACILLIN  >=4 RESISTANT R Final       PENICILLIN  >=0.5 RESISTANT R Final       RIFAMPIN  <=0.5 SENSITIVE S Final       TETRACYCLINE  >=16 RESISTANT R Final       TRIMETH/SULFA  >=320 RESISTANT R Final <=20 SENSITIVE S Final     VANCOMYCIN  1 SENSITIVE S Final       Assessment: 79yo male admitted with UTI.  Culture data as above.  Pt was given Vancomycin 1gm IV and Septra IV 320mg  (TMP component) last night on admission.  Estimated Creatinine Clearance: 18.9 ml/min (by C-G formula based on Cr of 2.44).  Goal of Therapy:  Vancomycin trough level 10-15 mcg/ml  Plan:  Vancomycin 750mg  IV q48h Check trough at steady state Septra DS 1 tab po daily starting tomorrow  Monitor labs, renal fxn, and cultures  Valrie Hart A 08/04/2013,8:07 AM

## 2013-08-04 NOTE — Progress Notes (Addendum)
Subjective: Interval History: has complaints cough but  no sputum production. Presently he denies any difficulty increasing. Patient states that he's feeling better..  Objective: Vital signs in last 24 hours: Temp:  [97.8 F (36.6 C)-98.6 F (37 C)] 97.8 F (36.6 C) (09/09 0402) Pulse Rate:  [67-78] 67 (09/09 0402) Resp:  [18-20] 20 (09/09 0402) BP: (155-169)/(54-66) 155/59 mmHg (09/09 0402) SpO2:  [90 %-100 %] 98 % (09/09 0402) FiO2 (%):  [35 %-40 %] 40 % (09/08 2237) Weight:  [69.3 kg (152 lb 12.5 oz)] 69.3 kg (152 lb 12.5 oz) (09/09 0402) Weight change: -2.6 kg (-5 lb 11.7 oz)  Intake/Output from previous day: 09/08 0701 - 09/09 0700 In: 1401.3 [I.V.:701.3; IV Piggyback:700] Out: 3150 [Urine:3150] Intake/Output this shift:    General appearance: alert, cooperative and no distress Resp: diminished breath sounds bilaterally Cardio: regular rate and rhythm, S1, S2 normal, no murmur, click, rub or gallop GI: soft, non-tender; bowel sounds normal; no masses,  no organomegaly Extremities: extremities normal, atraumatic, no cyanosis or edema  Lab Results:  Recent Labs  08/03/13 0510 08/04/13 0459  WBC 10.4 9.6  HGB 10.1* 10.3*  HCT 31.3* 31.8*  PLT 387 389   BMET:  Recent Labs  08/03/13 0510 08/04/13 0459  NA 137 136  K 4.4 4.0  CL 106 103  CO2 20 24  GLUCOSE 83 107*  BUN 55* 57*  CREATININE 2.37* 2.44*  CALCIUM 8.9 8.9   No results found for this basename: PTH,  in the last 72 hours Iron Studies: No results found for this basename: IRON, TIBC, TRANSFERRIN, FERRITIN,  in the last 72 hours  Studies/Results: US Renal  08/03/2013   CLINICAL DATA:  79 year old male with worsening renal failure.  EXAM: RENAL/URINARY TRACT ULTRASOUND COMPLETE  COMPARISON:  CT Abdomen and Pelvis 12/04/2012.  FINDINGS: Right Kidney: No hydronephrosis. Renal length 9.7 cm. Increased cortical echogenicity. Cortical thickness preserved.  Left Kidney: Moderate to severe hydronephrosis,  appears not significantly changed since comparison. A degree of cortical thinning now suspected.  Bladder:  Decompressed, with a Foley catheter balloon visible.  IMPRESSION: 1. Continued left hydronephrosis, previously suspected to be related to an obstructing soft tissue lesion at the level of the bladder. Clinical correlation recommended.  2. No right hydronephrosis, but increased right renal echotexture suggesting superimposed chronic medical renal disease.   Electronically Signed   By: Augusto Gamble M.D.   On: 08/03/2013 16:12   Dg Chest Port 1 View  08/03/2013   *RADIOLOGY REPORT*  Clinical Data: Dyspnea  PORTABLE CHEST - 1 VIEW  Comparison: 07/31/2013  Findings: The cardiac shadow is stable.  Vascular congestion with bilateral infiltrative changes are seen. These have progressed significantly in the interval from prior exam.  There is likely a left-sided pleural effusion present as well.  IMPRESSION: Rapid increase in bilateral infiltrative densities. This may be related to pulmonary edema.  Clinical correlation is recommended.   Original Report Authenticated By: Alcide Clever, M.D.    I have reviewed the patient's current medications.  Assessment/Plan: Problem #1 renal failure chronic his BUN and creatinine slightly high but overall is seems to be stable. Patient with with underlying chronic stage IV renal failure. Presently he doesn't have any nausea vomiting. Patient still with left moderate to severe hydronephrosis. Problem #2 hypertension his blood pressure seems to be reasonably controlled Problem #3 history of hypothyroidism Problem #4 history of bladder cancer Problem #5 history of of any difficulty breathing. Presently seems to be feeling much better. Possibly  secondary to CHF presently patient with good urine output. Problem #6 anemia Problem #7 metabolic bone disease his calcium and phosphorus was in acceptable range. Plan: We'll continue his present management Presently because of  persistent hydronephrosis patient may benefit from urology consult. Check his basic metabolic panel in the morning. If possible change bactrim to another antibiotics    LOS: 4 days   Reginald Waller S 08/04/2013,8:12 AM

## 2013-08-05 DIAGNOSIS — N1330 Unspecified hydronephrosis: Secondary | ICD-10-CM

## 2013-08-05 LAB — BASIC METABOLIC PANEL
CO2: 25 mEq/L (ref 19–32)
Calcium: 8.8 mg/dL (ref 8.4–10.5)
Creatinine, Ser: 2.69 mg/dL — ABNORMAL HIGH (ref 0.50–1.35)
GFR calc non Af Amer: 19 mL/min — ABNORMAL LOW (ref >90)

## 2013-08-05 LAB — PRO B NATRIURETIC PEPTIDE: Pro B Natriuretic peptide (BNP): 24573 pg/mL — ABNORMAL HIGH (ref 0–450)

## 2013-08-05 MED ORDER — LEVOFLOXACIN 500 MG PO TABS
500.0000 mg | ORAL_TABLET | Freq: Every day | ORAL | Status: DC
  Administered 2013-08-05: 500 mg via ORAL
  Filled 2013-08-05: qty 1

## 2013-08-05 MED ORDER — LEVOFLOXACIN 250 MG PO TABS: 250.0000 mg | ORAL_TABLET | ORAL | Status: DC

## 2013-08-05 NOTE — Consult Note (Signed)
ZOXW#[id number]

## 2013-08-05 NOTE — Progress Notes (Signed)
Reginald Waller  MRN: [id number]  DOB/AGE: [birth year] 79 y.o.  Primary Care Physician:MCGOUGH,WILLIAM M, MD  Admit date: 07/31/2013  Chief Complaint:  Chief Complaint  Patient presents with  . Neck Pain    S-Pt presented on  07/31/2013 with  Chief Complaint  Patient presents with  . Neck Pain  .    Pt today feels better   Meds  . amLODipine  10 mg Oral Daily  . aspirin EC  81 mg Oral QODAY  . carvedilol  3.125 mg Oral BID WC  . Chlorhexidine Gluconate Cloth  6 each Topical Q0600  . docusate sodium  200 mg Oral BID  . furosemide  80 mg Intravenous BID  . latanoprost  1 drop Both Eyes QHS  . levothyroxine  100 mcg Oral QAC breakfast  . mupirocin ointment  1 application Nasal BID  . sulfamethoxazole-trimethoprim  1 tablet Oral Daily  . terazosin  1 mg Oral QHS  . [START ON 08/06/2013] vancomycin  750 mg Intravenous Q48H   .  Physical Exam: Vital signs in last 24 hours: Temp:  [97.3 F (36.3 C)-98 F (36.7 C)] 97.8 F (36.6 C) (09/10 0506) Pulse Rate:  [61-69] 69 (09/10 0506) Resp:  [20] 20 (09/10 0506) BP: (123-151)/(47-64) 136/49 mmHg (09/10 0506) SpO2:  [96 %-100 %] 100 % (09/10 0506) Weight:  [149 lb 6.4 oz (67.767 kg)] 149 lb 6.4 oz (67.767 kg) (09/10 0506) Weight change: -3 lb 6.1 oz (-1.533 kg) Last BM Date: 07/31/13  Intake/Output from previous day: 09/09 0701 - 09/10 0700 In: 720 [P.O.:720] Out: 2950 [Urine:2950]     Physical Exam: General- pt is awake,alert, . Resp- No acute REsp distress, CTA B/L NO Rhonchi CVS- S1S2 regular in rate and rhythm GIT- BS+, soft, NT, ND EXT- NO LE Edema, Cyanosis   Lab Results: CBC  Recent Labs  08/03/13 0510 08/04/13 0459  WBC 10.4 9.6  HGB 10.1* 10.3*  HCT 31.3* 31.8*  PLT 387 389    BMET  Recent Labs  08/04/13 0459 08/05/13 0444  NA 136 134*  K 4.0 3.8  CL 103 97  CO2 24 25  GLUCOSE 107* 83  BUN 57* 59*  CREATININE 2.44* 2.69*  CALCIUM 8.9 8.8   Trend Creat 2014  3.02==>2.44==>2.69         1.5--2.0( Baseline) 2009    1.5--1.9  MICRO Recent Results (from the past 240 hour(s))  URINE CULTURE     Status: None   Collection Time    07/31/13  9:21 PM      Result Value Range Status   Specimen Description URINE, CLEAN CATCH   Final   Special Requests NONE   Final   Culture  Setup Time     Final   Value: 08/01/2013 19:59     Performed at [facility] Count     Final   Value: >=100,000 COLONIES/ML     Performed at [facility]   Culture     Final   Value: STENOTROPHOMONAS MALTOPHILIA     METHICILLIN RESISTANT STAPHYLOCOCCUS AUREUS     Note: RIFAMPIN AND GENTAMICIN SHOULD NOT BE USED AS SINGLE DRUGS FOR TREATMENT OF STAPH INFECTIONS.     Performed at [facility]   Report Status 08/04/2013 FINAL   Final   Organism ID, Bacteria STENOTROPHOMONAS MALTOPHILIA   Final   Organism ID, Bacteria METHICILLIN RESISTANT STAPHYLOCOCCUS AUREUS   Final  MRSA PCR SCREENING     Status: Abnormal  Collection Time    08/01/13  3:00 AM      Result Value Range Status   MRSA by PCR POSITIVE (*) NEGATIVE Final   Comment:            The GeneXpert MRSA Assay (FDA     approved for NASAL specimens     only), is one component of a     comprehensive MRSA colonization     surveillance program. It is not     intended to diagnose MRSA     infection nor to guide or     monitor treatment for     MRSA infections.     RESULT CALLED TO, READ BACK BY AND VERIFIED WITH:      FRANK,L @ 0514 ON 08/01/13 BY WOODIE,J  URINE CULTURE     Status: None   Collection Time    08/03/13  3:46 PM      Result Value Range Status   Specimen Description URINE, CLEAN CATCH   Final   Special Requests NONE   Final   Culture  Setup Time     Final   Value: 08/04/2013 14:28     Performed at [facility] Count PENDING   Incomplete   Culture     Final   Value: Culture reincubated for better growth     Performed at [facility]   Report Status PENDING    Incomplete      Lab Results  Component Value Date   PTH 97.2* 08/03/2013   CALCIUM 8.8 08/05/2013   CAION 1.20 05/13/2008   PHOS 4.1 08/04/2013    Study Conclusions  - Left ventricle: The cavity size was normal. There was mild to moderate concentric hypertrophy. Systolic function was normal. The estimated ejection fraction was in the range of 60% to 65%. Wall motion was normal; there were no regional wall motion abnormalities. - Mitral valve: Moderately to severely calcified annulus. Mildly thickened, moderately calcified leaflets . - Atrial septum: No defect or patent foramen ovale was identified. - Pericardium, extracardiac: There was a small right pleural effusion. There was a moderate-sized left pleural effusion.      Impression: 1)Renal  AKI secondary to Prerenal / ATN AKI on CKD AKI now worseing vs at his new baseline vs decreased creat secretion from bactrim CKD stage 4 . CKD since 2009  CKD secondary to Post Renal/ Age asso decline Progression of CKD marked with AKI   2)HTN Target Organ damage  CKD Medication- On Diuretics On Calcium Channel Blockers On Alpha and beta Blockers  3)Anemia HGb at goal (9--11)   4)CKD Mineral-Bone Disorder PTH acceptable. Secondary Hyperparathyroidism  present  Phosphorus at goal.   5)Urology Left sided Hydro Will suggest to consult Urology   6)Electrolytes  Normokalemic  Hyopnatremic   7)Acid base Co2 at goal     Plan:  Will suggest to consult urology wil sugget to d/c bacrtim Will suggest to follow Vanco levels Will suggest to increase IVf rate to 70ml/hr     BHUTANI,MANPREET S 08/05/2013, 10:03 AM

## 2013-08-05 NOTE — Clinical Social Work Note (Signed)
[facility] assessed pt today and feel they can manage him once off ventimask. CSW will continue to keep facility updated.  Derenda Fennel, [place] [phone]

## 2013-08-05 NOTE — Progress Notes (Signed)
[facility] PROGRESS NOTE  Reginald MATHENY GNF:[id number] DOB: 1919/03/10 DOA: 07/31/2013 PCP: Kirk Ruths, MD   Brief narrative 79 y/o male admitted with anemia, dehydration and AKI with findings of UTI. Patient given IV fluids but developed fluid overload.   Assessment/Plan: Dehydration   resolved but now has volume overload with excess hydration. Getting IV lasix with good UOP.  Pulmonary edema  secondary to volume overload. Elevated pro BNP. Now being diuresed with IV lasix. Still wheezy. continue lasix . Repeat x ray shows some improvement. Will check 2D echo. continue albuterol nebs.  UTI  cx growing staph and stenotrophomonas maltophilia. On vancomycin. Bactrim switched to levaquin today. Has chronic foley  AKI on CKD  possibly prerenal and with UTI. Renal US shows left sided hydronephrosis. Will get urology consult. Appreciate renal rcs. Avoid nephrotoxins  HTN  continue amlodipine  Hypothyroidism  continue synthroid  BPH  on chronic foley. Cont terazosin     Code status: DNR Family Communication:none at bedside Disposition Plan: will reassesses tomorrow. PT eval ordered Wishes to go home with his wife.    Consultants:  Renal   Procedures:  none  Antibiotics:  levaquin (9/10>>)  vancomycin ( 9/8>>_  HPI/Subjective: Still has some shortness of breath. Wishes to be discharged today  Objective: Filed Vitals:   08/05/13 0506  BP: 136/49  Pulse: 69  Temp: 97.8 F (36.6 C)  Resp: 20    Intake/Output Summary (Last 24 hours) at 08/05/13 1315 Last data filed at 08/05/13 0510  Gross per 24 hour  Intake    120 ml  Output   2950 ml  Net  -2830 ml   Filed Weights   08/03/13 0451 08/04/13 0402 08/05/13 0506  Weight: 71.9 kg (158 lb 8.2 oz) 69.3 kg (152 lb 12.5 oz) 67.767 kg (149 lb 6.4 oz)    Exam:   General: Elderly male lying in bed in no acute distress. On this mask  HEENT: No pallor, moist oral mucosa, no  JVD  Cardiovascular: Normal S1 and S2, no murmurs rub or gallop  Chest: Bilateral scattered wheezes, no crackles  Abdomen: Soft, nontender, nondistended, 40 In Pl.  Extremities: Warm, no edema  CNS: AAO x3    Data Reviewed: Basic Metabolic Panel:  Recent Labs Lab 08/01/13 0606 08/02/13 0617 08/03/13 0510 08/03/13 1817 08/04/13 0459 08/05/13 0444  NA 139 137 137  --  136 134*  K 4.5 4.9 4.4  --  4.0 3.8  CL 103 103 106  --  103 97  CO2 26 22 20   --  24 25  GLUCOSE 84 88 83  --  107* 83  BUN 65* 62* 55*  --  57* 59*  CREATININE 2.70* 2.71* 2.37*  --  2.44* 2.69*  CALCIUM 8.3* 8.9 8.9 8.2* 8.9 8.8  PHOS  --   --   --   --  4.1  --    Liver Function Tests:  Recent Labs Lab 07/31/13 2103  AST 18  ALT 10  ALKPHOS 73  BILITOT 0.2*  PROT 7.3  ALBUMIN 3.2*   No results found for this basename: LIPASE, AMYLASE,  in the last 168 hours No results found for this basename: AMMONIA,  in the last 168 hours CBC:  Recent Labs Lab 07/31/13 2103 08/01/13 0606 08/02/13 0617 08/03/13 0510 08/04/13 0459  WBC 8.4 8.2 11.2* 10.4 9.6  NEUTROABS 4.5  --   --   --   --   HGB 7.3* 7.0* 10.5* 10.1* 10.3*  HCT 22.8* 22.4* 31.7* 31.3* 31.8*  MCV 83.2 83.6 84.5 84.6 84.6  PLT 471* 437* 407* 387 389   Cardiac Enzymes:  Recent Labs Lab 07/31/13 2103 08/04/13 1050 08/04/13 1635 08/04/13 2235  TROPONINI <0.30 <0.30 <0.30 <0.30   BNP (last 3 results)  Recent Labs  04/19/13 0650 07/31/13 2103 08/05/13 1030  PROBNP 18596.0* 13832.0* 24573.0*   CBG: No results found for this basename: GLUCAP,  in the last 168 hours  Recent Results (from the past 240 hour(s))  URINE CULTURE     Status: None   Collection Time    07/31/13  9:21 PM      Result Value Range Status   Specimen Description URINE, CLEAN CATCH   Final   Special Requests NONE   Final   Culture  Setup Time     Final   Value: 08/01/2013 19:59     Performed at [facility] Count     Final    Value: >=100,000 COLONIES/ML     Performed at [facility]   Culture     Final   Value: STENOTROPHOMONAS MALTOPHILIA     METHICILLIN RESISTANT STAPHYLOCOCCUS AUREUS     Note: RIFAMPIN AND GENTAMICIN SHOULD NOT BE USED AS SINGLE DRUGS FOR TREATMENT OF STAPH INFECTIONS.     Performed at [facility]   Report Status 08/04/2013 FINAL   Final   Organism ID, Bacteria STENOTROPHOMONAS MALTOPHILIA   Final   Organism ID, Bacteria METHICILLIN RESISTANT STAPHYLOCOCCUS AUREUS   Final  MRSA PCR SCREENING     Status: Abnormal   Collection Time    08/01/13  3:00 AM      Result Value Range Status   MRSA by PCR POSITIVE (*) NEGATIVE Final   Comment:            The GeneXpert MRSA Assay (FDA     approved for NASAL specimens     only), is one component of a     comprehensive MRSA colonization     surveillance program. It is not     intended to diagnose MRSA     infection nor to guide or     monitor treatment for     MRSA infections.     RESULT CALLED TO, READ BACK BY AND VERIFIED WITH:      FRANK,L @ 0514 ON 08/01/13 BY WOODIE,J  URINE CULTURE     Status: None   Collection Time    08/03/13  3:46 PM      Result Value Range Status   Specimen Description URINE, CLEAN CATCH   Final   Special Requests NONE   Final   Culture  Setup Time     Final   Value: 08/04/2013 14:28     Performed at [facility] Count PENDING   Incomplete   Culture     Final   Value: Culture reincubated for better growth     Performed at [facility]   Report Status PENDING   Incomplete     Studies: Dg Chest 1 View  08/04/2013   *RADIOLOGY REPORT*  Clinical Data: Weakness, CHF, follow up  CHEST - 1 VIEW  Comparison: 08/03/2013  Findings: Normal heart size and mediastinal contours. Diffuse interstitial infiltrates slightly improved likely representing improved pulmonary edema. Small left pleural effusion, decreased. Minimal bibasilar atelectasis. Atherosclerotic calcification aorta. No  pneumothorax.  IMPRESSION: Improving CHF.   Original Report Authenticated By: Ulyses Southward, M.D.  US Renal  08/03/2013   CLINICAL DATA:  79 year old male with worsening renal failure.  EXAM: RENAL/URINARY TRACT ULTRASOUND COMPLETE  COMPARISON:  CT Abdomen and Pelvis 12/04/2012.  FINDINGS: Right Kidney: No hydronephrosis. Renal length 9.7 cm. Increased cortical echogenicity. Cortical thickness preserved.  Left Kidney: Moderate to severe hydronephrosis, appears not significantly changed since comparison. A degree of cortical thinning now suspected.  Bladder:  Decompressed, with a Foley catheter balloon visible.  IMPRESSION: 1. Continued left hydronephrosis, previously suspected to be related to an obstructing soft tissue lesion at the level of the bladder. Clinical correlation recommended.  2. No right hydronephrosis, but increased right renal echotexture suggesting superimposed chronic medical renal disease.   Electronically Signed   By: Augusto Gamble M.D.   On: 08/03/2013 16:12    Scheduled Meds: . amLODipine  10 mg Oral Daily  . aspirin EC  81 mg Oral QODAY  . carvedilol  3.125 mg Oral BID WC  . docusate sodium  200 mg Oral BID  . furosemide  80 mg Intravenous BID  . latanoprost  1 drop Both Eyes QHS  . [START ON 08/07/2013] levofloxacin  250 mg Oral Q48H  . levothyroxine  100 mcg Oral QAC breakfast  . mupirocin ointment  1 application Nasal BID  . terazosin  1 mg Oral QHS  . [START ON 08/06/2013] vancomycin  750 mg Intravenous Q48H   Continuous Infusions: . sodium chloride 10 mL/hr at 08/03/13 0957      Time spent: 25 minutes    Rilie Glanz  [facility] Pager [phone] If 7PM-7AM, please contact night-coverage at [email or web address], password [facility] 08/05/2013, 1:15 PM  LOS: 5 days

## 2013-08-05 NOTE — Consult Note (Signed)
NAME:  Reginald Waller, Reginald Waller NO.:  [id number]  MEDICAL RECORD NO.:  [id number]  LOCATION:  A308                          FACILITY:  [facility]  PHYSICIAN:  Ky Barban, M.D.DATE OF BIRTH:  27-Jun-1919  DATE OF CONSULTATION: DATE OF DISCHARGE:                                CONSULTATION   [name] is a 79 year old gentleman, who is well known to me.  He is in acute urinary retention because of BPH.  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  At that time with the family, it was decided not to do anything further.  I did not want to even consider doing a TUR prostate because he is 79 years old, and he has done well with Foley catheter drainage.  At this time, he is admitted primarily because of exacerbation of arthritis in his left shoulder.  I just talked to him.  He says he has no abdominal pain, no back ache.  PHYSICAL EXAMINATION:  Moderately built male, who has lost some weight. His blood pressure is 144/56, temperature 98.2, his pulse is 66 per minute, O2 saturation 100% on room air.  He was also found to be dehydrated when he came in, so he is being getting IV fluids.  His electrolytes show sodium 134, potassium 3.8, chloride 97, CO2 is 25, BUN on admission is 59, creatinine is 2.69.  He does have a component of renal failure.  His hematocrit is 31.8.  His renal ultrasound was done. It showed left kidney moderate to severe hydronephrosis, degree of cortical thinning also suspected.  Right kidney, no hydronephrosis.  His right kidney is obstructed with tumor in his bladder and I know from previous examination.  So, I do not think we are going to do anything more than, just palliation.  IMPRESSION: 1. Bladder tumor, urinary retention probably because of benign     prostatic hypertrophy. 2. Dehydration. 3. Arthritis, left shoulder.  Continue supportive therapy  with IV     fluids.  We showed him.  We will see him back only on p.r.n. basis.     Ky Barban, M.D.     MIJ/MEDQ  D:  08/05/2013  T:  08/05/2013  Job:  [id number]

## 2013-08-05 NOTE — Clinical Documentation Improvement (Signed)
THIS DOCUMENT IS NOT A PERMANENT PART OF THE MEDICAL RECORD  Please update your documentation with the medical record to reflect your response to this query. If you need help knowing how to do this please call [phone].  08/05/13  Dear Dr. Karilyn Cota / Associates,  In a better effort to capture your patient's severity of illness, reflect appropriate length of stay and utilization of resources, a review of the patient medical record has revealed the following: Renal 9/9 progress note states dyspnea possibly due to CHF.   BNP on admit = [phone]; repeat drawn today not resulted  Being treated with IV lasix BID  CXR notes improving CHF    Based on your clinical judgment, please clarify and document in a progress note and discharge summary the:   Type of CHF - likely systolic, diastolic, systolic and diastolic  Acuity of - acute, chronic, acute on chronic  CHF ruled out   In responding to this query please exercise your independent judgment.  The fact that a query is asked, does not imply that any particular answer is desired or expected.     Reviewed:  no additional documentation provided  Thank Lucilla Edin  Clinical Documentation Specialist: [phone] Health Information Management [place]

## 2013-08-06 ENCOUNTER — Encounter (HOSPITAL_COMMUNITY): Payer: Self-pay

## 2013-08-06 DIAGNOSIS — I517 Cardiomegaly: Secondary | ICD-10-CM

## 2013-08-06 DIAGNOSIS — E440 Moderate protein-calorie malnutrition: Secondary | ICD-10-CM | POA: Diagnosis present

## 2013-08-06 LAB — BASIC METABOLIC PANEL
CO2: 26 mEq/L (ref 19–32)
Calcium: 8.6 mg/dL (ref 8.4–10.5)
Creatinine, Ser: 2.94 mg/dL — ABNORMAL HIGH (ref 0.50–1.35)

## 2013-08-06 MED ORDER — ENSURE COMPLETE PO LIQD
237.0000 mL | Freq: Two times a day (BID) | ORAL | Status: DC
  Administered 2013-08-06 (×2): via ORAL

## 2013-08-06 MED ORDER — TORSEMIDE 20 MG PO TABS
50.0000 mg | ORAL_TABLET | Freq: Every day | ORAL | Status: DC
  Administered 2013-08-06: 50 mg via ORAL
  Filled 2013-08-06: qty 3

## 2013-08-06 MED ORDER — LINEZOLID 600 MG PO TABS: 600.0000 mg | ORAL_TABLET | Freq: Two times a day (BID) | ORAL | Status: DC

## 2013-08-06 MED ORDER — LEVOFLOXACIN 250 MG PO TABS: 250.0000 mg | ORAL_TABLET | ORAL | Status: DC

## 2013-08-06 MED ORDER — PRO-STAT SUGAR FREE PO LIQD
30.0000 mL | Freq: Three times a day (TID) | ORAL | Status: DC
  Administered 2013-08-06 (×2): 30 mL via ORAL
  Filled 2013-08-06 (×2): qty 30

## 2013-08-06 MED ORDER — ENSURE COMPLETE PO LIQD: 237.0000 mL | Freq: Two times a day (BID) | ORAL | Status: DC

## 2013-08-06 MED ORDER — ALBUTEROL SULFATE (5 MG/ML) 0.5% IN NEBU: 2.5000 mg | INHALATION_SOLUTION | RESPIRATORY_TRACT | Status: DC | PRN

## 2013-08-06 MED ORDER — TORSEMIDE 20 MG PO TABS: 50.0000 mg | ORAL_TABLET | Freq: Every day | ORAL | Status: AC

## 2013-08-06 MED ORDER — PRO-STAT SUGAR FREE PO LIQD: 30.0000 mL | Freq: Three times a day (TID) | ORAL | Status: DC

## 2013-08-06 NOTE — Care Management Note (Signed)
    Page 1 of 1   08/06/2013     5:09:48 PM   CARE MANAGEMENT NOTE 08/06/2013  Patient:  Reginald Waller, Reginald Waller   Account Number:  [id number]  Date Initiated:  08/06/2013  Documentation initiated by:  Anibal Henderson  Subjective/Objective Assessment:   Admitted with AKI and UTI, from [facility]. CSW following.Pt to return to [facility] today, with [facility] and O2     Action/Plan:   [facility] has their own [facility], so this is not set up per CM. O2 set up with [facility] per [facility] request and patient OK with this   Anticipated DC Date:  08/06/2013   Anticipated DC Plan:  ASSISTED LIVING / REST HOME  In-house referral  Clinical Social Worker      DC Associate Professor  CM consult      [facility] Choice  DURABLE MEDICAL EQUIPMENT   Choice offered to / List presented to:             Status of service:  Completed, signed off Medicare Important Message given?  YES (If response is "NO", the following Medicare IM given date fields will be blank) Date Medicare IM given:  08/06/2013 Date Additional Medicare IM given:    Discharge Disposition:  ASSISTED LIVING  Per UR Regulation:  Reviewed for med. necessity/level of care/duration of stay  If discussed at Long Length of Stay Meetings, dates discussed:    Comments:  08/06/13 1700 Anibal Henderson RN/CM

## 2013-08-06 NOTE — Progress Notes (Signed)
Subjective: Interval History: has no complaint of cough. Presently he denies any difficulty increasing. Patient states that he'Waller feeling better..  Objective: Vital signs in last 24 hours: Temp:  [96.8 F (36 C)-98.2 F (36.8 C)] 96.8 F (36 C) (09/11 0500) Pulse Rate:  [63-66] 65 (09/11 0500) Resp:  [20] 20 (09/11 0500) BP: (132-155)/(47-66) 147/66 mmHg (09/11 0500) SpO2:  [99 %-100 %] 99 % (09/11 0500) Weight:  [66.5 kg (146 lb 9.7 oz)] 66.5 kg (146 lb 9.7 oz) (09/11 0500) Weight change: -1.268 kg (-2 lb 12.7 oz)  Intake/Output from previous day: 09/10 0701 - 09/11 0700 In: 1560 [P.O.:360] Out: 1150 [Urine:1150] Intake/Output this shift:    General appearance: alert, cooperative and no distress Resp: diminished breath sounds bilaterally Cardio: regular rate and rhythm, S1, S2 normal, no murmur, click, rub or gallop GI: soft, non-tender; bowel sounds normal; no masses,  no organomegaly Extremities: extremities normal, atraumatic, no cyanosis or edema  Lab Results:  Recent Labs  08/04/13 0459  WBC 9.6  HGB 10.3*  HCT 31.8*  PLT 389   BMET:   Recent Labs  08/05/13 0444 08/06/13 0506  NA 134* 135  K 3.8 4.0  CL 97 97  CO2 25 26  GLUCOSE 83 85  BUN 59* 61*  CREATININE 2.69* 2.94*  CALCIUM 8.8 8.6    Recent Labs  08/03/13 1817  PTH 97.2*   Iron Studies:   Recent Labs  08/04/13 0459  IRON 13*  TIBC 204*  FERRITIN 69    Studies/Results: Dg Chest 1 View  08/04/2013   *RADIOLOGY REPORT*  Clinical Data: Weakness, CHF, follow up  CHEST - 1 VIEW  Comparison: 08/03/2013  Findings: Normal heart size and mediastinal contours. Diffuse interstitial infiltrates slightly improved likely representing improved pulmonary edema. Small left pleural effusion, decreased. Minimal bibasilar atelectasis. Atherosclerotic calcification aorta. No pneumothorax.  IMPRESSION: Improving CHF.   Original Report Authenticated By: Ulyses Southward, M.D.    I have reviewed the patient'Waller  current medications.  Assessment/Plan: Problem #1 renal failure chronic his BUN and creatinine slightly high but overall is seems to be still stable. Patient with with underlying chronic stage IV renal failure. Presently he doesn't have any nausea vomiting. Patient still with left moderate to severe hydronephrosis. Problem #2 hypertension his blood pressure seems to be reasonably controlled Problem #3 history of hypothyroidism Problem #4 history of bladder cancer Problem #5 history of of any difficulty breathing. Possibly a combination of pneumonia and also CHF. Presently that has improved. Problem #6 anemia Problem #7 metabolic bone disease his calcium and phosphorus is with  in acceptable range. Plan: We'll DC Lasixe           Will start patient on Demadex 50 mg once a day.           We'll check his basic metabolic panel in the morning.    LOS: 6 days   Reginald Waller 08/06/2013,8:33 AM

## 2013-08-06 NOTE — Discharge Planning (Signed)
Pt was taken off [place] (02) and SPO2 levels fell to 86% on RA  - after about 5 min.

## 2013-08-06 NOTE — Clinical Social Work Placement (Addendum)
Met with Pt and informed him of d/c plans to go back to [facility]. Pt, Pt's niece, and facility agreeable to his return. Pt will transfer via [facility], called by RN when oxygen arrives at [facility]. CM arranging oxygen at facility. D/c summary and FL2 faxed.    Cameron Ali SW intern   [place], [place] [phone]

## 2013-08-06 NOTE — Progress Notes (Signed)
*  PRELIMINARY RESULTS* Echocardiogram 2D Echocardiogram has been performed.  Reginald Waller 08/06/2013, 4:46 PM

## 2013-08-06 NOTE — Progress Notes (Signed)
INITIAL NUTRITION ASSESSMENT  DOCUMENTATION CODES Per approved criteria  -Non-severe (moderate) malnutrition in the context of chronic illness   Pt meets criteria for moderate MALNUTRITION in the context of chronic illness as evidenced by mild muscle wasting, ,<75% energy intake x 1 month.  INTERVENTION: Ensure Complete po BID, each supplement provides 350 kcal and 13 grams of protein. 30 ml Prostat TID, which provides 300 kcals and 45 grams protein.   NUTRITION DIAGNOSIS: Inadequate oral intake related to decreased appetite as evidenced by PO: 10-60%, pt reports hx of weight loss, muscle wasting.   Goal: Pt will meet >90% of estimated energy needs  Monitor:  PO intake, weight changes, labs, skin integrity, changes in status  Reason for Assessment: LOS day 6, poor po's  79 y.o. male  Admitting Dx: <principal problem not specified>  ASSESSMENT: This very pleasant gentleman resides at [facility]. He reports that his appetite is improving, but does admit to hx of poor appetite. He states he usually does well at breakfast and supper tends to be his poorest meal. PO: 10-60% this hospitalization.  He reports a hx of a 30# wt loss, but he is unable to tell me when he lost weight. Wt hx reveals wt has been stable for the past 5 months.  Limited physical exam reveals mild muscle wasting at the temples and clavicle.  CSW noted that was on hospice services up until 2 weeks ago, so this may be a large contributor to weight loss and declining nutritional status.   Height: Ht Readings from Last 1 Encounters:  08/01/13 5\' 11"  (1.803 m)    Weight: Wt Readings from Last 1 Encounters:  08/06/13 146 lb 9.7 oz (66.5 kg)    Ideal Body Weight: 172#  % Ideal Body Weight: 85%  Wt Readings from Last 10 Encounters:  08/06/13 146 lb 9.7 oz (66.5 kg)  04/19/13 145 lb 6.1 oz (65.944 kg)  04/16/13 145 lb (65.772 kg)  04/04/13 151 lb 14.4 oz (68.9 kg)  03/22/13 150 lb (68.04 kg)  03/15/13  150 lb (68.04 kg)  12/17/12 166 lb 7.2 oz (75.5 kg)  12/12/12 182 lb 15.7 oz (83 kg)  12/12/12 182 lb 15.7 oz (83 kg)  12/12/12 182 lb 15.7 oz (83 kg)    Usual Body Weight: 172#  % Usual Body Weight: 85%  BMI:  Body mass index is 20.46 kg/(m^2). Meets criteria for normal weight.   Estimated Nutritional Needs: Kcal: 1610-9604 daily Protein: 83-100 grams daily Fluid: 2.0-2.3 L daily  Skin: stage II pressure ulcer on sacrum  Diet Order: Cardiac  EDUCATION NEEDS: -Education needs addressed   Intake/Output Summary (Last 24 hours) at 08/06/13 1124 Last data filed at 08/06/13 0519  Gross per 24 hour  Intake   1440 ml  Output   1150 ml  Net    290 ml    Last BM: 07/31/13  Labs:   Recent Labs Lab 08/03/13 0510  08/04/13 0459 08/05/13 0444 08/06/13 0506  NA 137  --  136 134* 135  K 4.4  --  4.0 3.8 4.0  CL 106  --  103 97 97  CO2 20  --  24 25 26   BUN 55*  --  57* 59* 61*  CREATININE 2.37*  --  2.44* 2.69* 2.94*  CALCIUM 8.9  < > 8.9 8.8 8.6  PHOS  --   --  4.1  --   --   GLUCOSE 83  --  107* 83 85  < > =  values in this interval not displayed.  CBG (last 3)  No results found for this basename: GLUCAP,  in the last 72 hours  Scheduled Meds: . amLODipine  10 mg Oral Daily  . aspirin EC  81 mg Oral QODAY  . carvedilol  3.125 mg Oral BID WC  . docusate sodium  200 mg Oral BID  . latanoprost  1 drop Both Eyes QHS  . [START ON 08/07/2013] levofloxacin  250 mg Oral Q48H  . levothyroxine  100 mcg Oral QAC breakfast  . terazosin  1 mg Oral QHS  . torsemide  50 mg Oral Daily  . vancomycin  750 mg Intravenous Q48H    Continuous Infusions: . sodium chloride 20 mL/hr at 08/06/13 1105    Past Medical History  Diagnosis Date  . Hypertension   . Arthritis   . Stroke   . Malaria   . Anemia   . Hyperlipidemia   . Urothelial cancer 12/16/2012    Past Surgical History  Procedure Laterality Date  . Hernia repair    . Cataract extraction    . Appendectomy    .  Eye surgery    . Cystoscopy  12/08/2012    Procedure: CYSTOSCOPY FLEXIBLE;  Surgeon: Ky Barban, MD;  Location: [facility];  Service: Urology;  Laterality: N/A;  . Transurethral resection of bladder tumor  12/09/2012    Procedure: TRANSURETHRAL RESECTION OF BLADDER TUMOR (TURBT);  Surgeon: Ky Barban, MD;  Location: [facility];  Service: Urology;  Laterality: N/A;    Shamika Pedregon A. Mayford Knife, RD, LDN Pager: [phone]

## 2013-08-06 NOTE — Discharge Planning (Signed)
[facility] called to say they had oxygen for pt onsite and EMS could be called for transport.  Report given to [facility] and EMS called for transport.  EMS was unsure of exact arrival time.

## 2013-08-06 NOTE — Discharge Summary (Addendum)
Physician Discharge Summary  Reginald Waller UJW:[id number] DOB: Jan 14, 1919 DOA: 07/31/2013  PCP: Kirk Ruths, MD  Admit date: 07/31/2013 Discharge date: 08/06/2013  Time spent: 40  minutes  Recommendations for Outpatient Follow-up:  1. Monitor renal function in 2 days 2. Follow up with PCP and renal within 1 week  Discharge Diagnoses:  Principal Problem:   Acute on chronic renal failure  Active Problems:   Dehydration   Hydronephrosis   UTI (lower urinary tract infection)   Hypertension   Bladder cancer   CVA (cerebral vascular accident)   Acute diastolic heart failure   Anemia   CKD (chronic kidney disease) stage 3, GFR 30-59 ml/min   Chronic indwelling Foley catheter   Malnutrition of moderate degree   Discharge Condition: fair  Diet recommendation: cardiac  Filed Weights   08/04/13 0402 08/05/13 0506 08/06/13 0500  Weight: 69.3 kg (152 lb 12.5 oz) 67.767 kg (149 lb 6.4 oz) 66.5 kg (146 lb 9.7 oz)    History of present illness:  Please refer to admission H&P for details but in brief, 79 y/o male admitted with anemia, dehydration and AKI with findings of UTI. Patient given IV fluids but developed fluid overload. Now symptomatically improving  with diuresis.   Hospital Course:  Dehydration  resolved but developed  volume overload with excess hydration. Getting IV lasix with good urine output. Now switched to oral demadex. Monitor renal function in 2-3  Days.  Pulmonary edema  secondary to volume overload. Elevated pro BNP. Now being diuresed with IV lasix. clinically improved . Repeat x ray shows some improvement. Will check 2D echo in January was normal.  continue albuterol nebs q4- 6h prn. sats stable on 2L via [place] and will be titrated at the facility.  UTI  cx growing MRSA  and stenotrophomonas maltophilia. On vancomycin. Bactrim switched to levaquin.Marland Kitchen Has chronic foley . Will discharge on a course of levaquin and oral linezolid to complete a 2 week  course.  AKI on CKD  possibly prerenal and with UTI. Renal US shows left sided hydronephrosis. Seen by urology. No intervention recommended as this is related to his tumor and treatment would only be palliation. Renal function still not improved. Changed lasix to demadex. He has good urine output. Can be discharged  with close outpt follow up. Appreciate renal evalaution. Needs follow up in 1 week.   HTN  continue amlodipine   Hypothyroidism  continue synthroid   BPH  on chronic foley. Continue terazosin   Code status: DNR  Family Communication:none at bedside  Disposition Plan: return to assist living with PT and RN  Consultants:  Renal Urology   Procedures:  none Antibiotics:  levaquin (9/10>>)  vancomycin ( 9/8>>_     Discharge Exam: Filed Vitals:   08/06/13 0847  BP: 145/68  Pulse:   Temp:   Resp:     General: Elderly male lying in bed in no acute distress.   HEENT: No pallor, moist oral mucosa, no JVD  Cardiovascular: Normal S1 and S2, no murmurs rub or gallop Chest: clear b/l , no crackles or wheezes Abdomen: Soft, nontender, nondistended, BS+, foley in place  Extremities: Warm, no edema  CNS: AAO x3  Discharge Instructions     Medication List    STOP taking these medications       furosemide 40 MG tablet  Commonly known as:  LASIX      TAKE these medications       albuterol 108 (90 BASE) MCG/ACT inhaler  Commonly known as:  PROVENTIL HFA;VENTOLIN HFA  Inhale 1-2 puffs into the lungs every 6 (six) hours as needed for wheezing.     amLODipine 10 MG tablet  Commonly known as:  NORVASC  Take 1 tablet (10 mg total) by mouth daily.     ARTHRITIS PAIN RELIEVER 650 MG CR tablet  Generic drug:  acetaminophen  Take 325-650 mg by mouth daily as needed. For pain     aspirin EC 81 MG tablet  Take 81 mg by mouth every other day.     carvedilol 3.125 MG tablet  Commonly known as:  COREG  Take 1 tablet (3.125 mg total) by mouth 2 (two) times  daily with a meal.     dextromethorphan-guaiFENesin 30-600 MG per 12 hr tablet  Commonly known as:  MUCINEX DM  Take 1 tablet by mouth every 12 (twelve) hours.     docusate sodium 100 MG capsule  Commonly known as:  COLACE  Take 200 mg by mouth 2 (two) times daily.     feeding supplement Liqd  Take 237 mLs by mouth 2 (two) times daily between meals.     feeding supplement Liqd  Take 30 mLs by mouth 3 (three) times daily with meals.     LANTISEPTIC THERAPEUTIC 37 % Crea  Apply 1 application topically 2 (two) times daily. Applied to buttocks     latanoprost 0.005 % ophthalmic solution  Commonly known as:  XALATAN  Place 1 drop into both eyes at bedtime.     levofloxacin 250 MG tablet  Commonly known as:  LEVAQUIN  Take 1 tablet (250 mg total) by mouth every other day.  Start taking on:  08/07/2013 until 9/22     levothyroxine 100 MCG tablet  Commonly known as:  SYNTHROID, LEVOTHROID  Take 100 mcg by mouth daily before breakfast.     linezolid 600 MG tablet  Commonly known as:  ZYVOX  Take 1 tablet (600 mg total) by mouth every 12 (twelve) hours. Until 9/22     LORazepam 0.5 MG tablet  Commonly known as:  ATIVAN  Take 0.5 tablets (0.25 mg total) by mouth every 6 (six) hours as needed for anxiety.     POLY-IRON 150 150 MG capsule  Generic drug:  iron polysaccharides  Take 150 mg by mouth daily.     solifenacin 5 MG tablet  Commonly known as:  VESICARE  Take 5 mg by mouth daily.     terazosin 1 MG capsule  Commonly known as:  HYTRIN  Take 1 mg by mouth at bedtime.     torsemide 20 MG tablet  Commonly known as:  DEMADEX  Take 2.5 tablets (50 mg total) by mouth daily.     Albuterol 5 mg/ ml 0.5% Nebulizer solution  every 4 hrs as needed for SOB and wheezing   Allergies  Allergen Reactions  . Shellfish Allergy Other (See Comments)    Swelling of throat and tongue.       Follow-up Information   Follow up with Kirk Ruths, MD In 1 week.   Specialty:   Family Medicine   Contact information:   [address] [place] [address] [phone]       Follow up with [facility], MD. Schedule an appointment as soon as possible for a visit in 1 week.   Specialty:  Nephrology   Contact information:   [address] [place] [address] [phone]  The results of significant diagnostics from this hospitalization (including imaging, microbiology, ancillary and laboratory) are listed below for reference.    Significant Diagnostic Studies: Dg Chest 1 View  08/04/2013   *RADIOLOGY REPORT*  Clinical Data: Weakness, CHF, follow up  CHEST - 1 VIEW  Comparison: 08/03/2013  Findings: Normal heart size and mediastinal contours. Diffuse interstitial infiltrates slightly improved likely representing improved pulmonary edema. Small left pleural effusion, decreased. Minimal bibasilar atelectasis. Atherosclerotic calcification aorta. No pneumothorax.  IMPRESSION: Improving CHF.   Original Report Authenticated By: Ulyses Southward, M.D.   Dg Chest 1 View  07/31/2013   *RADIOLOGY REPORT*  Clinical Data: Neck pain, shoulder pain, back pain  CHEST - 1 VIEW  Comparison: 04/19/2013  Findings: Enlargement of cardiac silhouette. Calcified tortuous aorta. Pulmonary vascular cephalization. Minimal chronic interstitial infiltrates question related to chronic failure or fibrosis. Aeration appears improved since previous exam likely representing rate improved edema. No pleural effusion or pneumothorax. Bones demineralized. Probable mixed lytic and sclerotic lesion of the proximal right humerus, appears present on previous exams.  IMPRESSION: Enlargement of cardiac silhouette with pulmonary vascular congestion. Scattered chronic interstitial infiltrates question related to chronic failure or fibrosis. No definite acute process.   Original Report Authenticated By: Ulyses Southward, M.D.   Ct Head Wo Contrast  07/31/2013    *RADIOLOGY  REPORT*  Clinical Data:  Neck pain, stroke, hypertension, neck pain  CT HEAD WITHOUT CONTRAST CT CERVICAL SPINE WITHOUT CONTRAST  Technique:  Multidetector CT imaging of the head and cervical spine was performed following the standard protocol without intravenous contrast.  Multiplanar CT image reconstructions of the cervical spine were also generated.  Comparison:  Brain MRI 12/15/2012  CT HEAD  Findings: No acute intracranial hemorrhage.  No focal mass lesion. No CT evidence of acute infarction.   No midline shift or mass effect.  No hydrocephalus.  Basilar cisterns are patent.  Extensive periventricular white matter hypodensities as well subcortical white matter hypodensities most severe in the right frontal temporal lobe.  Paranasal sinuses and mastoid air cells are clear.  Orbits are normal.  IMPRESSION:  1.  No acute intracranial findings. 2.  Severe atrophy and chronic white matter disease.  CT CERVICAL SPINE  Findings: No prevertebral soft tissue swelling.  Normal alignment cervical vertebral bodies.  There is loss of disc space at C6 and C7.  There is degenerative anterolisthesis of C3 on C4, C4-C5, C5 on C6.  There is degenerative in the ligaments posterior the dens with settling of the basion tip into the dens.  These are chronic findings.  No epidural paraspinal hematoma.  IMPRESSION:  1.  No acute findings cervical spine.  2.  Settling of the basion into the tip of the dens.  3.  Chronic changes within the transverse ligaments posterior to the dens.  4.   Multilevel disc osteophytic disease with degenerative mild subluxations throughout the cervical spine.   Original Report Authenticated By: Genevive Bi, M.D.   Ct Cervical Spine Wo Contrast  07/31/2013    *RADIOLOGY REPORT*  Clinical Data:  Neck pain, stroke, hypertension, neck pain  CT HEAD WITHOUT CONTRAST CT CERVICAL SPINE WITHOUT CONTRAST  Technique:  Multidetector CT imaging of the head and cervical spine was performed following the standard  protocol without intravenous contrast.  Multiplanar CT image reconstructions of the cervical spine were also generated.  Comparison:  Brain MRI 12/15/2012  CT HEAD  Findings: No acute intracranial hemorrhage.  No focal mass lesion. No CT evidence of acute infarction.  No midline shift or mass effect.  No hydrocephalus.  Basilar cisterns are patent.  Extensive periventricular white matter hypodensities as well subcortical white matter hypodensities most severe in the right frontal temporal lobe.  Paranasal sinuses and mastoid air cells are clear.  Orbits are normal.  IMPRESSION:  1.  No acute intracranial findings. 2.  Severe atrophy and chronic white matter disease.  CT CERVICAL SPINE  Findings: No prevertebral soft tissue swelling.  Normal alignment cervical vertebral bodies.  There is loss of disc space at C6 and C7.  There is degenerative anterolisthesis of C3 on C4, C4-C5, C5 on C6.  There is degenerative in the ligaments posterior the dens with settling of the basion tip into the dens.  These are chronic findings.  No epidural paraspinal hematoma.  IMPRESSION:  1.  No acute findings cervical spine.  2.  Settling of the basion into the tip of the dens.  3.  Chronic changes within the transverse ligaments posterior to the dens.  4.   Multilevel disc osteophytic disease with degenerative mild subluxations throughout the cervical spine.   Original Report Authenticated By: Genevive Bi, M.D.   US Renal  08/03/2013   CLINICAL DATA:  79 year old male with worsening renal failure.  EXAM: RENAL/URINARY TRACT ULTRASOUND COMPLETE  COMPARISON:  CT Abdomen and Pelvis 12/04/2012.  FINDINGS: Right Kidney: No hydronephrosis. Renal length 9.7 cm. Increased cortical echogenicity. Cortical thickness preserved.  Left Kidney: Moderate to severe hydronephrosis, appears not significantly changed since comparison. A degree of cortical thinning now suspected.  Bladder:  Decompressed, with a Foley catheter balloon visible.   IMPRESSION: 1. Continued left hydronephrosis, previously suspected to be related to an obstructing soft tissue lesion at the level of the bladder. Clinical correlation recommended.  2. No right hydronephrosis, but increased right renal echotexture suggesting superimposed chronic medical renal disease.   Electronically Signed   By: Augusto Gamble M.D.   On: 08/03/2013 16:12   Dg Chest Port 1 View  08/03/2013   *RADIOLOGY REPORT*  Clinical Data: Dyspnea  PORTABLE CHEST - 1 VIEW  Comparison: 07/31/2013  Findings: The cardiac shadow is stable.  Vascular congestion with bilateral infiltrative changes are seen. These have progressed significantly in the interval from prior exam.  There is likely a left-sided pleural effusion present as well.  IMPRESSION: Rapid increase in bilateral infiltrative densities. This may be related to pulmonary edema.  Clinical correlation is recommended.   Original Report Authenticated By: Alcide Clever, M.D.    Microbiology: Recent Results (from the past 240 hour(s))  URINE CULTURE     Status: None   Collection Time    07/31/13  9:21 PM      Result Value Range Status   Specimen Description URINE, CLEAN CATCH   Final   Special Requests NONE   Final   Culture  Setup Time     Final   Value: 08/01/2013 19:59     Performed at [facility] Count     Final   Value: >=100,000 COLONIES/ML     Performed at [facility]   Culture     Final   Value: STENOTROPHOMONAS MALTOPHILIA     METHICILLIN RESISTANT STAPHYLOCOCCUS AUREUS     Note: RIFAMPIN AND GENTAMICIN SHOULD NOT BE USED AS SINGLE DRUGS FOR TREATMENT OF STAPH INFECTIONS.     Performed at [facility]   Report Status 08/04/2013 FINAL   Final   Organism ID, Bacteria STENOTROPHOMONAS MALTOPHILIA   Final  Organism ID, Bacteria METHICILLIN RESISTANT STAPHYLOCOCCUS AUREUS   Final  MRSA PCR SCREENING     Status: Abnormal   Collection Time    08/01/13  3:00 AM      Result Value Range Status   MRSA by  PCR POSITIVE (*) NEGATIVE Final   Comment:            The GeneXpert MRSA Assay (FDA     approved for NASAL specimens     only), is one component of a     comprehensive MRSA colonization     surveillance program. It is not     intended to diagnose MRSA     infection nor to guide or     monitor treatment for     MRSA infections.     RESULT CALLED TO, READ BACK BY AND VERIFIED WITH:      FRANK,L @ 0514 ON 08/01/13 BY WOODIE,J  URINE CULTURE     Status: None   Collection Time    08/03/13  3:46 PM      Result Value Range Status   Specimen Description URINE, CLEAN CATCH   Final   Special Requests NONE   Final   Culture  Setup Time     Final   Value: 08/04/2013 14:28     Performed at [facility]   Colony Count PENDING   Incomplete   Culture     Final   Value: Culture reincubated for better growth     Performed at [facility]   Report Status PENDING   Incomplete     Labs: Basic Metabolic Panel:  Recent Labs Lab 08/02/13 0617 08/03/13 0510 08/03/13 1817 08/04/13 0459 08/05/13 0444 08/06/13 0506  NA 137 137  --  136 134* 135  K 4.9 4.4  --  4.0 3.8 4.0  CL 103 106  --  103 97 97  CO2 22 20  --  24 25 26   GLUCOSE 88 83  --  107* 83 85  BUN 62* 55*  --  57* 59* 61*  CREATININE 2.71* 2.37*  --  2.44* 2.69* 2.94*  CALCIUM 8.9 8.9 8.2* 8.9 8.8 8.6  PHOS  --   --   --  4.1  --   --    Liver Function Tests:  Recent Labs Lab 07/31/13 2103  AST 18  ALT 10  ALKPHOS 73  BILITOT 0.2*  PROT 7.3  ALBUMIN 3.2*   No results found for this basename: LIPASE, AMYLASE,  in the last 168 hours No results found for this basename: AMMONIA,  in the last 168 hours CBC:  Recent Labs Lab 07/31/13 2103 08/01/13 0606 08/02/13 0617 08/03/13 0510 08/04/13 0459  WBC 8.4 8.2 11.2* 10.4 9.6  NEUTROABS 4.5  --   --   --   --   HGB 7.3* 7.0* 10.5* 10.1* 10.3*  HCT 22.8* 22.4* 31.7* 31.3* 31.8*  MCV 83.2 83.6 84.5 84.6 84.6  PLT 471* 437* 407* 387 389   Cardiac  Enzymes:  Recent Labs Lab 07/31/13 2103 08/04/13 1050 08/04/13 1635 08/04/13 2235  TROPONINI <0.30 <0.30 <0.30 <0.30   BNP: BNP (last 3 results)  Recent Labs  04/19/13 0650 07/31/13 2103 08/05/13 1030  PROBNP 18596.0* 13832.0* 24573.0*   CBG: No results found for this basename: GLUCAP,  in the last 168 hours     Signed:  Magdiel Bartles  [facility] 08/06/2013, 2:52 PM

## 2013-08-06 NOTE — Discharge Planning (Signed)
[facility] and confirmed that they are still waiting on O2 to arrive for pt.  Once it arrives, they will contact us to call for EMS to transport.

## 2013-08-07 LAB — URINE CULTURE: Colony Count: >=100000

## 2013-11-29 ENCOUNTER — Inpatient Hospital Stay (HOSPITAL_COMMUNITY)
Admission: EM | Admit: 2013-11-29 | Discharge: 2013-12-01 | DRG: 689 | Disposition: A | Discharge status: Skilled Nursing Facility | Payer: Medicare Other | Attending: Internal Medicine | Admitting: Internal Medicine

## 2013-11-29 ENCOUNTER — Encounter (HOSPITAL_COMMUNITY): Payer: Self-pay | Admitting: Emergency Medicine

## 2013-11-29 DIAGNOSIS — N390 Urinary tract infection, site not specified: Principal | ICD-10-CM | POA: Diagnosis present

## 2013-11-29 DIAGNOSIS — C679 Malignant neoplasm of bladder, unspecified: Secondary | ICD-10-CM | POA: Diagnosis present

## 2013-11-29 DIAGNOSIS — Z7982 Long term (current) use of aspirin: Secondary | ICD-10-CM

## 2013-11-29 DIAGNOSIS — Z8249 Family history of ischemic heart disease and other diseases of the circulatory system: Secondary | ICD-10-CM

## 2013-11-29 DIAGNOSIS — N183 Chronic kidney disease, stage 3 unspecified: Secondary | ICD-10-CM | POA: Diagnosis present

## 2013-11-29 DIAGNOSIS — R319 Hematuria, unspecified: Secondary | ICD-10-CM | POA: Diagnosis present

## 2013-11-29 DIAGNOSIS — Z87891 Personal history of nicotine dependence: Secondary | ICD-10-CM

## 2013-11-29 DIAGNOSIS — E785 Hyperlipidemia, unspecified: Secondary | ICD-10-CM | POA: Diagnosis present

## 2013-11-29 DIAGNOSIS — N189 Chronic kidney disease, unspecified: Secondary | ICD-10-CM

## 2013-11-29 DIAGNOSIS — I129 Hypertensive chronic kidney disease with stage 1 through stage 4 chronic kidney disease, or unspecified chronic kidney disease: Secondary | ICD-10-CM | POA: Diagnosis present

## 2013-11-29 DIAGNOSIS — N179 Acute kidney failure, unspecified: Secondary | ICD-10-CM | POA: Diagnosis present

## 2013-11-29 DIAGNOSIS — Z960 Presence of urogenital implants: Secondary | ICD-10-CM

## 2013-11-29 DIAGNOSIS — Z66 Do not resuscitate: Secondary | ICD-10-CM | POA: Diagnosis present

## 2013-11-29 DIAGNOSIS — D62 Acute posthemorrhagic anemia: Secondary | ICD-10-CM

## 2013-11-29 DIAGNOSIS — Z806 Family history of leukemia: Secondary | ICD-10-CM

## 2013-11-29 DIAGNOSIS — Z8673 Personal history of transient ischemic attack (TIA), and cerebral infarction without residual deficits: Secondary | ICD-10-CM

## 2013-11-29 DIAGNOSIS — M129 Arthropathy, unspecified: Secondary | ICD-10-CM | POA: Diagnosis present

## 2013-11-29 DIAGNOSIS — IMO0002 Reserved for concepts with insufficient information to code with codable children: Secondary | ICD-10-CM

## 2013-11-29 DIAGNOSIS — E43 Unspecified severe protein-calorie malnutrition: Secondary | ICD-10-CM | POA: Diagnosis present

## 2013-11-29 DIAGNOSIS — Z978 Presence of other specified devices: Secondary | ICD-10-CM

## 2013-11-29 DIAGNOSIS — Z833 Family history of diabetes mellitus: Secondary | ICD-10-CM

## 2013-11-29 DIAGNOSIS — D649 Anemia, unspecified: Secondary | ICD-10-CM | POA: Diagnosis present

## 2013-11-29 LAB — CBC WITH DIFFERENTIAL/PLATELET
BAND NEUTROPHILS: 1 % (ref 0–10)
BLASTS: 0 %
Basophils Absolute: 0 10*3/uL (ref 0.0–0.1)
Basophils Relative: 0 % (ref 0–1)
Eosinophils Absolute: 0.2 10*3/uL (ref 0.0–0.7)
Eosinophils Relative: 1 % (ref 0–5)
HCT: 26.6 % — ABNORMAL LOW (ref 39.0–52.0)
Hemoglobin: 8.7 g/dL — ABNORMAL LOW (ref 13.0–17.0)
LYMPHS ABS: 0.8 10*3/uL (ref 0.7–4.0)
Lymphocytes Relative: 4 % — ABNORMAL LOW (ref 12–46)
MCH: 31.8 pg (ref 26.0–34.0)
MCHC: 32.7 g/dL (ref 30.0–36.0)
MCV: 97.1 fL (ref 78.0–100.0)
MYELOCYTES: 0 %
Metamyelocytes Relative: 0 %
Monocytes Absolute: 1.6 10*3/uL — ABNORMAL HIGH (ref 0.1–1.0)
Monocytes Relative: 8 % (ref 3–12)
NEUTROS PCT: 86 % — AB (ref 43–77)
Neutro Abs: 17.9 10*3/uL — ABNORMAL HIGH (ref 1.7–7.7)
PROMYELOCYTES ABS: 0 %
Platelets: 330 10*3/uL (ref 150–400)
RBC: 2.74 MIL/uL — ABNORMAL LOW (ref 4.22–5.81)
RDW: 14.8 % (ref 11.5–15.5)
WBC: 20.5 10*3/uL — ABNORMAL HIGH (ref 4.0–10.5)
nRBC: 0 /100 WBC

## 2013-11-29 LAB — URINALYSIS, ROUTINE W REFLEX MICROSCOPIC
BILIRUBIN URINE: NEGATIVE
Glucose, UA: NEGATIVE mg/dL
Ketones, ur: NEGATIVE mg/dL
NITRITE: POSITIVE — AB
PH: 6 (ref 5.0–8.0)
Protein, ur: 100 mg/dL — AB
SPECIFIC GRAVITY, URINE: 1.02 (ref 1.005–1.030)
UROBILINOGEN UA: 0.2 mg/dL (ref 0.0–1.0)

## 2013-11-29 LAB — BASIC METABOLIC PANEL
BUN: 89 mg/dL — ABNORMAL HIGH (ref 6–23)
CALCIUM: 8.8 mg/dL (ref 8.4–10.5)
CHLORIDE: 99 meq/L (ref 96–112)
CO2: 27 meq/L (ref 19–32)
Creatinine, Ser: 3.35 mg/dL — ABNORMAL HIGH (ref 0.50–1.35)
GFR calc Af Amer: 17 mL/min — ABNORMAL LOW (ref >90)
GFR calc non Af Amer: 15 mL/min — ABNORMAL LOW (ref >90)
GLUCOSE: 133 mg/dL — AB (ref 70–99)
Potassium: 4.9 mEq/L (ref 3.7–5.3)
SODIUM: 137 meq/L (ref 137–147)

## 2013-11-29 LAB — URINE MICROSCOPIC-ADD ON

## 2013-11-29 MED ORDER — SODIUM CHLORIDE 0.9 % IV BOLUS (SEPSIS)
500.0000 mL | Freq: Once | INTRAVENOUS | Status: AC
  Administered 2013-11-29: 500 mL via INTRAVENOUS

## 2013-11-29 MED ORDER — LEVOFLOXACIN IN D5W 750 MG/150ML IV SOLN
750.0000 mg | Freq: Once | INTRAVENOUS | Status: AC
  Administered 2013-11-29: 750 mg via INTRAVENOUS
  Filled 2013-11-29: qty 150

## 2013-11-29 MED ORDER — VANCOMYCIN HCL IN DEXTROSE 1-5 GM/200ML-% IV SOLN
1000.0000 mg | Freq: Once | INTRAVENOUS | Status: AC
  Administered 2013-11-29: 1000 mg via INTRAVENOUS
  Filled 2013-11-29: qty 200

## 2013-11-29 NOTE — H&P (Signed)
[facility] History and Physical  Reginald Waller HMC:[id number] DOB: Jan 24, 1919 DOA: 11/29/2013   PCP: Leonides Grills, MD  Specialists: Dr. Michela Pitcher is his urologist  Chief Complaint: Bleeding in the Foley catheter  HPI: Reginald Waller is a 80 y.o. male with a past medical history of hypertension, bladder cancer with a chronic indwelling Foley, who was brought in from his skilled nursing facility due to blood in the Foley bag. Apparently, they had seen clots as well. Patient denies any pain. No nausea, vomiting. Denies any fever. He is more concerned about the arthritis in his hips. Denies any recent falls or injuries. Not a very good historian.  Home Medications: Prior to Admission medications   Medication Sig Start Date End Date Taking? Authorizing Provider  acetaminophen (TYLENOL) 500 MG tablet Take 500 mg by mouth every 6 (six) hours as needed. pain   Yes Historical Provider, MD  albuterol (PROAIR HFA) 108 (90 BASE) MCG/ACT inhaler Inhale 2 puffs into the lungs 4 (four) times daily as needed for wheezing or shortness of breath. dyspnea   Yes Historical Provider, MD  amLODipine (NORVASC) 10 MG tablet Take 1 tablet (10 mg total) by mouth daily. 12/11/12  Yes Bobby Rumpf York, PA-C  aspirin 81 MG chewable tablet Chew 81 mg by mouth daily.   Yes Historical Provider, MD  carvedilol (COREG) 3.125 MG tablet Take 1 tablet (3.125 mg total) by mouth 2 (two) times daily with a meal. 04/06/13  Yes Lezlie Octave Black, NP  dextromethorphan-guaiFENesin [facility] DM) 30-600 MG per 12 hr tablet Take 1 tablet by mouth every 12 (twelve) hours.   Yes Historical Provider, MD  divalproex (DEPAKOTE SPRINKLE) 125 MG capsule Take 125 mg by mouth 2 (two) times daily.   Yes Historical Provider, MD  docusate sodium (COLACE) 100 MG capsule Take 200 mg by mouth 2 (two) times daily.   Yes Historical Provider, MD  feeding supplement (ENSURE COMPLETE) LIQD Take 237 mLs by mouth 2 (two) times daily between meals. 08/06/13   Yes Nishant Dhungel, MD  feeding supplement (PRO-STAT SUGAR FREE 64) LIQD Take 30 mLs by mouth 3 (three) times daily with meals. 08/06/13  Yes Nishant Dhungel, MD  iron polysaccharides (POLY-IRON 150) 150 MG capsule Take 150 mg by mouth daily.   Yes Historical Provider, MD  latanoprost (XALATAN) 0.005 % ophthalmic solution Place 1 drop into both eyes at bedtime.   Yes Historical Provider, MD  levothyroxine (SYNTHROID, LEVOTHROID) 137 MCG tablet Take 137 mcg by mouth daily.   Yes Historical Provider, MD  LORazepam (ATIVAN) 0.5 MG tablet Take 0.5 mg by mouth 2 (two) times daily.   Yes Historical Provider, MD  polyethylene glycol (MIRALAX / GLYCOLAX) packet Take 17 g by mouth daily.   Yes Historical Provider, MD  solifenacin (VESICARE) 5 MG tablet Take 5 mg by mouth daily.   Yes Historical Provider, MD  terazosin (HYTRIN) 1 MG capsule Take 1 mg by mouth at bedtime.   Yes Historical Provider, MD  torsemide (DEMADEX) 20 MG tablet Take 2.5 tablets (50 mg total) by mouth daily. 08/06/13  Yes Nishant Dhungel, MD  zinc oxide 20 % ointment Apply 1 application topically 2 (two) times daily.   Yes Historical Provider, MD    Allergies:  Allergies  Allergen Reactions  . Shellfish Allergy Other (See Comments)    Swelling of throat and tongue.    Past Medical History: Past Medical History  Diagnosis Date  . Hypertension   . Arthritis   . Stroke   .  Malaria   . Anemia   . Hyperlipidemia   . Urothelial cancer 12/16/2012    Past Surgical History  Procedure Laterality Date  . Hernia repair    . Cataract extraction    . Appendectomy    . Eye surgery    . Cystoscopy  12/08/2012    Procedure: CYSTOSCOPY FLEXIBLE;  Surgeon: Marissa Nestle, MD;  Location: [facility];  Service: Urology;  Laterality: N/A;  . Transurethral resection of bladder tumor  12/09/2012    Procedure: TRANSURETHRAL RESECTION OF BLADDER TUMOR (TURBT);  Surgeon: Marissa Nestle, MD;  Location: [facility];  Service: Urology;  Laterality:  N/A;    Social History: Lives in [place], which is a skilled nursing facility. Activity level is unknown. He is former smoker.  Family History:  Family History  Problem Relation Age of Onset  . Diabetes Mother   . Diabetes Father   . Cancer Sister     breast  . Cancer Sister     thyroid  . Cancer Brother     leukemia  . Cancer Brother   . Coronary artery disease Brother      Review of Systems - unable to obtain due to to his cognitive impairment  Physical Examination  Filed Vitals:   11/29/13 1808 11/29/13 2009 11/29/13 2146 11/29/13 2223  BP:  134/43 142/50   Pulse:  69 69   Temp: 98.4 F (36.9 C)  99.3 F (37.4 C)   TempSrc: Rectal  Oral   Resp:  20 18   Height:    _0  (1.803 m)  Weight:    68.04 kg (150 lb)  SpO2:  98% 99%     General appearance: alert, appears stated age, distracted and no distress Head: Normocephalic, without obvious abnormality, atraumatic Eyes: conjunctivae/corneas clear. PERRL, EOM's intact.  Neck: no adenopathy, no carotid bruit, no JVD, supple, symmetrical, trachea midline and thyroid not enlarged, symmetric, no tenderness/mass/nodules Resp: clear to auscultation bilaterally Cardio: regular rate and rhythm, S1, S2 normal, no murmur, click, rub or gallop GI: soft, non-tender; bowel sounds normal; no masses,  no organomegaly Extremities: extremities normal, atraumatic, no cyanosis or edema Skin: Skin color, texture, turgor normal. No rashes or lesions Neurologic: Slightly disoriented but alert. No focal neurological deficits are present.  Laboratory Data: Results for orders placed during the hospital encounter of 11/29/13 (from the past 48 hour(s))  URINALYSIS, ROUTINE W REFLEX MICROSCOPIC     Status: Abnormal   Collection Time    11/29/13  6:27 PM      Result Value Range   Color, Urine AMBER (*) YELLOW   Comment: BIOCHEMICALS MAY BE AFFECTED BY COLOR   APPearance CLOUDY (*) CLEAR   Specific Gravity, Urine 1.020  1.005 -  1.030   pH 6.0  5.0 - 8.0   Glucose, UA NEGATIVE  NEGATIVE mg/dL   Hgb urine dipstick LARGE (*) NEGATIVE   Bilirubin Urine NEGATIVE  NEGATIVE   Ketones, ur NEGATIVE  NEGATIVE mg/dL   Protein, ur 100 (*) NEGATIVE mg/dL   Urobilinogen, UA 0.2  0.0 - 1.0 mg/dL   Nitrite POSITIVE (*) NEGATIVE   Leukocytes, UA LARGE (*) NEGATIVE  URINE MICROSCOPIC-ADD ON     Status: Abnormal   Collection Time    11/29/13  6:27 PM      Result Value Range   WBC, UA TOO NUMEROUS TO COUNT  <3 WBC/hpf   RBC / HPF TOO NUMEROUS TO COUNT  <3 RBC/hpf   Bacteria, UA  MANY (*) RARE  CBC WITH DIFFERENTIAL     Status: Abnormal   Collection Time    11/29/13  7:45 PM      Result Value Range   WBC 20.5 (*) 4.0 - 10.5 K/uL   RBC 2.74 (*) 4.22 - 5.81 MIL/uL   Hemoglobin 8.7 (*) 13.0 - 17.0 g/dL   HCT 26.6 (*) 39.0 - 52.0 %   MCV 97.1  78.0 - 100.0 fL   MCH 31.8  26.0 - 34.0 pg   MCHC 32.7  30.0 - 36.0 g/dL   RDW 14.8  11.5 - 15.5 %   Platelets 330  150 - 400 K/uL   Neutrophils Relative % 86 (*) 43 - 77 %   Lymphocytes Relative 4 (*) 12 - 46 %   Monocytes Relative 8  3 - 12 %   Eosinophils Relative 1  0 - 5 %   Basophils Relative 0  0 - 1 %   Band Neutrophils 1  0 - 10 %   Metamyelocytes Relative 0     Myelocytes 0     Promyelocytes Absolute 0     Blasts 0     nRBC 0  0 /100 WBC   Neutro Abs 17.9 (*) 1.7 - 7.7 K/uL   Lymphs Abs 0.8  0.7 - 4.0 K/uL   Monocytes Absolute 1.6 (*) 0.1 - 1.0 K/uL   Eosinophils Absolute 0.2  0.0 - 0.7 K/uL   Basophils Absolute 0.0  0.0 - 0.1 K/uL  BASIC METABOLIC PANEL     Status: Abnormal   Collection Time    11/29/13  7:45 PM      Result Value Range   Sodium 137  137 - 147 mEq/L   Comment: Please note change in reference range.   Potassium 4.9  3.7 - 5.3 mEq/L   Comment: Please note change in reference range.   Chloride 99  96 - 112 mEq/L   CO2 27  19 - 32 mEq/L   Glucose, Bld 133 (*) 70 - 99 mg/dL   BUN 89 (*) 6 - 23 mg/dL   Creatinine, Ser 3.35 (*) 0.50 - 1.35 mg/dL    Calcium 8.8  8.4 - 10.5 mg/dL   GFR calc non Af Amer 15 (*) >90 mL/min   GFR calc Af Amer 17 (*) >90 mL/min   Comment: (NOTE)     The eGFR has been calculated using the CKD EPI equation.     This calculation has not been validated in all clinical situations.     eGFR's persistently <90 mL/min signify possible Chronic Kidney     Disease.    Radiology Reports: No results found.  Problem List  Principal Problem:   UTI (urinary tract infection) Active Problems:   Bladder cancer   CKD (chronic kidney disease) stage 3, GFR 30-59 ml/min   Chronic indwelling Foley catheter   Acute on chronic renal failure   Hematuria   Assessment: This is a 80 year old, Caucasian male, who presents with hematuria. He has a chronic indwelling Foley due to a history of bladder cancer. Based on previous notes it appears to be inoperable. The nursing home staff noticed blood in the Foley catheter with clots, which is unusual for him. He also has acute on chronic kidney failure and has anemia, which appears to be slightly worse than his baseline.  Plan: #1 hematuria with urinary tract infection with leukocytosis: Previous cultures have been noted and he actually grew MRSA in his urine back in  September and also had enterococcus. Urine cultures are pending at this time. We will cover him with vancomycin and ceftriaxone for now. He is not septic at this time.  #2 history of bladder cancer with a chronic indwelling Foley: Consult Dr. Michela Pitcher due to his hematuria.  #3 acute on chronic kidney failure: He will be given IV fluids, and renal function will be monitored closely along with urine output  #4 normocytic anemia with possible acute blood loss: Baseline hemoglobin appears to be around 10. He has been as low as 7 earlier this year. It's possible his drop in hemoglobin could be from his hematuria. Continue to monitor hemoglobin closely.  #5 history of hypertension: Continue with his antihypertensive  agents.  DVT Prophylaxis: SCDs Code Status: DO NOT RESUSCITATE based on his paperwork from her skilled nursing facility Family Communication: No family at bedside  Disposition Plan: Admit to MedSurg   Further management decisions will depend on results of further testing and patient's response to treatment.  [facility]  [facility] Pager [phone]  If 7PM-7AM, please contact night-coverage [email or web address] Password [email or web address]  11/29/2013, 11:07 PM

## 2013-11-29 NOTE — ED Notes (Signed)
Pt from [place]. Pt has indwelling cath. Noticed little blood in foley bag x 2 days ago. Much worse with black urine and clots in urine. Pt alert/oriented to most. Pt c/o burning with urination.

## 2013-11-29 NOTE — ED Provider Notes (Signed)
CSN: [id number]     Arrival date & time 11/29/13  1803 History   First MD Initiated Contact with Patient 11/29/13 1839     Chief Complaint  Patient presents with  . Dysuria   (Consider location/radiation/quality/duration/timing/severity/associated sxs/prior Treatment) Patient is a 80 y.o. male presenting with dysuria. The history is provided by the patient.  Dysuria Pertinent negatives include no chest pain, no abdominal pain and no shortness of breath.   patient is a chronic Foley catheter for bladder cancer. He lives in an assisted living. He has had repeated infections and repeated episodes of bleeding. He began having increased bleeding today. He's been passing clots. No fevers or chills. He states he's been eating less. He said mild crampy abdominal pain. He states it feels crampy on and he has had the bleeding. No fevers. The Foley has been flowing freely. Patient has required previous blood transfusions for his anemia.  Past Medical History  Diagnosis Date  . Hypertension   . Arthritis   . Stroke   . Malaria   . Anemia   . Hyperlipidemia   . Urothelial cancer 12/16/2012   Past Surgical History  Procedure Laterality Date  . Hernia repair    . Cataract extraction    . Appendectomy    . Eye surgery    . Cystoscopy  12/08/2012    Procedure: CYSTOSCOPY FLEXIBLE;  Surgeon: Marissa Nestle, MD;  Location: [facility];  Service: Urology;  Laterality: N/A;  . Transurethral resection of bladder tumor  12/09/2012    Procedure: TRANSURETHRAL RESECTION OF BLADDER TUMOR (TURBT);  Surgeon: Marissa Nestle, MD;  Location: [facility];  Service: Urology;  Laterality: N/A;   Family History  Problem Relation Age of Onset  . Diabetes Mother   . Diabetes Father   . Cancer Sister     breast  . Cancer Sister     thyroid  . Cancer Brother     leukemia  . Cancer Brother   . Coronary artery disease Brother    History  Substance Use Topics  . Smoking status: Former Research scientist (life sciences)  . Smokeless tobacco:  Former Systems developer  . Alcohol Use: No    Review of Systems  Constitutional: Positive for appetite change. Negative for fever.  Respiratory: Negative for shortness of breath.   Cardiovascular: Negative for chest pain.  Gastrointestinal: Negative for abdominal pain.  Genitourinary: Positive for dysuria and hematuria. Negative for difficulty urinating.  Musculoskeletal: Negative for arthralgias.  Skin: Positive for wound (patient states there was a tear on his left leg earlier today). Negative for rash.  Hematological: Does not bruise/bleed easily.    Allergies  Shellfish allergy  Home Medications   Current Outpatient Rx  Name  Route  Sig  Dispense  Refill  . acetaminophen (TYLENOL) 500 MG tablet   Oral   Take 500 mg by mouth every 6 (six) hours as needed. pain         . albuterol (PROAIR HFA) 108 (90 BASE) MCG/ACT inhaler   Inhalation   Inhale 2 puffs into the lungs 4 (four) times daily as needed for wheezing or shortness of breath. dyspnea         . amLODipine (NORVASC) 10 MG tablet   Oral   Take 1 tablet (10 mg total) by mouth daily.   30 tablet   0   . aspirin 81 MG chewable tablet   Oral   Chew 81 mg by mouth daily.         Marland Kitchen  carvedilol (COREG) 3.125 MG tablet   Oral   Take 1 tablet (3.125 mg total) by mouth 2 (two) times daily with a meal.   30 tablet   0   . dextromethorphan-guaiFENesin (MUCINEX DM) 30-600 MG per 12 hr tablet   Oral   Take 1 tablet by mouth every 12 (twelve) hours.         . divalproex (DEPAKOTE SPRINKLE) 125 MG capsule   Oral   Take 125 mg by mouth 2 (two) times daily.         Marland Kitchen docusate sodium (COLACE) 100 MG capsule   Oral   Take 200 mg by mouth 2 (two) times daily.         . feeding supplement (ENSURE COMPLETE) LIQD   Oral   Take 237 mLs by mouth 2 (two) times daily between meals.   30 Bottle   0   . feeding supplement (PRO-STAT SUGAR FREE 64) LIQD   Oral   Take 30 mLs by mouth 3 (three) times daily with meals.   900  mL   0   . iron polysaccharides (POLY-IRON 150) 150 MG capsule   Oral   Take 150 mg by mouth daily.         Marland Kitchen latanoprost (XALATAN) 0.005 % ophthalmic solution   Both Eyes   Place 1 drop into both eyes at bedtime.         Marland Kitchen levothyroxine (SYNTHROID, LEVOTHROID) 137 MCG tablet   Oral   Take 137 mcg by mouth daily.         Marland Kitchen LORazepam (ATIVAN) 0.5 MG tablet   Oral   Take 0.5 mg by mouth 2 (two) times daily.         . polyethylene glycol (MIRALAX / GLYCOLAX) packet   Oral   Take 17 g by mouth daily.         . solifenacin (VESICARE) 5 MG tablet   Oral   Take 5 mg by mouth daily.         Marland Kitchen terazosin (HYTRIN) 1 MG capsule   Oral   Take 1 mg by mouth at bedtime.         . torsemide (DEMADEX) 20 MG tablet   Oral   Take 2.5 tablets (50 mg total) by mouth daily.   90 tablet   0   . torsemide (DEMADEX) 20 MG tablet   Oral   Take 50 mg by mouth daily.         Marland Kitchen zinc oxide 20 % ointment   Topical   Apply 1 application topically 2 (two) times daily.          BP 142/50  Pulse 69  Temp(Src) 99.3 F (37.4 C) (Oral)  Resp 18  SpO2 99% Physical Exam  Constitutional: He appears well-developed.  HENT:  Dry mucous membranes  Eyes: EOM are normal. Pupils are equal, round, and reactive to light.  Cardiovascular: Normal rate and regular rhythm.   Pulmonary/Chest: Effort normal and breath sounds normal.  Abdominal: Soft.  Genitourinary:  Foley catheter in place. Blood and sediment in tubing and bag.  Musculoskeletal: Normal range of motion.    ED Course  Procedures (including critical care time) Labs Review Labs Reviewed  URINALYSIS, ROUTINE W REFLEX MICROSCOPIC - Abnormal; Notable for the following:    Color, Urine AMBER (*)    APPearance CLOUDY (*)    Hgb urine dipstick LARGE (*)    Protein, ur 100 (*)    Nitrite  POSITIVE (*)    Leukocytes, UA LARGE (*)    All other components within normal limits  CBC WITH DIFFERENTIAL - Abnormal; Notable for  the following:    WBC 20.5 (*)    RBC 2.74 (*)    Hemoglobin 8.7 (*)    HCT 26.6 (*)    Neutrophils Relative % 86 (*)    Lymphocytes Relative 4 (*)    Neutro Abs 17.9 (*)    Monocytes Absolute 1.6 (*)    All other components within normal limits  BASIC METABOLIC PANEL - Abnormal; Notable for the following:    Glucose, Bld 133 (*)    BUN 89 (*)    Creatinine, Ser 3.35 (*)    GFR calc non Af Amer 15 (*)    GFR calc Af Amer 17 (*)    All other components within normal limits  URINE MICROSCOPIC-ADD ON - Abnormal; Notable for the following:    Bacteria, UA MANY (*)    All other components within normal limits  URINE CULTURE   Imaging Review No results found.  EKG Interpretation   None       MDM   1. UTI (lower urinary tract infection)   2. Acute on chronic renal failure   3. Anemia    Patient with anemia and chronic Foley catheter. Has apparent urinary tract infection and has had increased hematuria. He has anemia that is slightly increased from his baseline. He has had drug resistant organisms recently. He has worsening renal insufficiency. He does not appear septic at this time. He will be admitted to internal medicine, with whom I discussed the antibiotic choice.    Jasper Riling. Alvino Chapel, MD 11/29/13 2203

## 2013-11-29 NOTE — ED Notes (Signed)
Family at bedside. Patient was resting with eyes closed. States that he is not having any pain except for back, but that's due to arthritis.

## 2013-11-30 ENCOUNTER — Encounter (HOSPITAL_COMMUNITY): Payer: Self-pay | Admitting: *Deleted

## 2013-11-30 DIAGNOSIS — N189 Chronic kidney disease, unspecified: Secondary | ICD-10-CM

## 2013-11-30 DIAGNOSIS — N179 Acute kidney failure, unspecified: Secondary | ICD-10-CM

## 2013-11-30 DIAGNOSIS — E43 Unspecified severe protein-calorie malnutrition: Secondary | ICD-10-CM | POA: Insufficient documentation

## 2013-11-30 LAB — CBC
HCT: 24.2 % — ABNORMAL LOW (ref 39.0–52.0)
Hemoglobin: 8.1 g/dL — ABNORMAL LOW (ref 13.0–17.0)
MCH: 32.3 pg (ref 26.0–34.0)
MCHC: 33.5 g/dL (ref 30.0–36.0)
MCV: 96.4 fL (ref 78.0–100.0)
Platelets: 305 10*3/uL (ref 150–400)
RBC: 2.51 MIL/uL — ABNORMAL LOW (ref 4.22–5.81)
RDW: 14.5 % (ref 11.5–15.5)
WBC: 17 10*3/uL — ABNORMAL HIGH (ref 4.0–10.5)

## 2013-11-30 LAB — COMPREHENSIVE METABOLIC PANEL
ALBUMIN: 2.7 g/dL — AB (ref 3.5–5.2)
ALK PHOS: 72 U/L (ref 39–117)
ALT: 5 U/L (ref 0–53)
AST: 12 U/L (ref 0–37)
BILIRUBIN TOTAL: 0.3 mg/dL (ref 0.3–1.2)
BUN: 84 mg/dL — ABNORMAL HIGH (ref 6–23)
CHLORIDE: 102 meq/L (ref 96–112)
CO2: 23 mEq/L (ref 19–32)
Calcium: 8.6 mg/dL (ref 8.4–10.5)
Creatinine, Ser: 2.9 mg/dL — ABNORMAL HIGH (ref 0.50–1.35)
GFR calc Af Amer: 20 mL/min — ABNORMAL LOW (ref >90)
GFR calc non Af Amer: 17 mL/min — ABNORMAL LOW (ref >90)
GLUCOSE: 89 mg/dL (ref 70–99)
POTASSIUM: 4.7 meq/L (ref 3.7–5.3)
Sodium: 139 mEq/L (ref 137–147)
Total Protein: 6.7 g/dL (ref 6.0–8.3)

## 2013-11-30 LAB — MRSA PCR SCREENING: MRSA by PCR: POSITIVE — AB

## 2013-11-30 MED ORDER — DEXTROSE 5 % IV SOLN
INTRAVENOUS | Status: AC
  Filled 2013-11-30: qty 10

## 2013-11-30 MED ORDER — AMLODIPINE BESYLATE 5 MG PO TABS
10.0000 mg | ORAL_TABLET | Freq: Every day | ORAL | Status: DC
  Administered 2013-11-30 – 2013-12-01 (×2): 10 mg via ORAL
  Filled 2013-11-30 (×2): qty 2

## 2013-11-30 MED ORDER — ALBUTEROL SULFATE (2.5 MG/3ML) 0.083% IN NEBU: 2.5000 mg | INHALATION_SOLUTION | RESPIRATORY_TRACT | Status: DC | PRN

## 2013-11-30 MED ORDER — LEVOTHYROXINE SODIUM 137 MCG PO TABS
137.0000 ug | ORAL_TABLET | Freq: Every day | ORAL | Status: DC
  Administered 2013-11-30 – 2013-12-01 (×2): 137 ug via ORAL
  Filled 2013-11-30 (×3): qty 1

## 2013-11-30 MED ORDER — VANCOMYCIN HCL IN DEXTROSE 750-5 MG/150ML-% IV SOLN
750.0000 mg | INTRAVENOUS | Status: DC
  Filled 2013-11-30: qty 150

## 2013-11-30 MED ORDER — ONDANSETRON HCL 4 MG PO TABS: 4.0000 mg | ORAL_TABLET | Freq: Four times a day (QID) | ORAL | Status: DC | PRN

## 2013-11-30 MED ORDER — DIVALPROEX SODIUM 125 MG PO CPSP
125.0000 mg | ORAL_CAPSULE | Freq: Two times a day (BID) | ORAL | Status: DC
  Administered 2013-11-30 – 2013-12-01 (×3): 125 mg via ORAL
  Filled 2013-11-30 (×6): qty 1

## 2013-11-30 MED ORDER — PRO-STAT SUGAR FREE PO LIQD
30.0000 mL | Freq: Three times a day (TID) | ORAL | Status: DC
  Administered 2013-11-30 – 2013-12-01 (×2): 30 mL via ORAL
  Filled 2013-11-30 (×3): qty 30

## 2013-11-30 MED ORDER — ACETAMINOPHEN 650 MG RE SUPP: 650.0000 mg | Freq: Four times a day (QID) | RECTAL | Status: DC | PRN

## 2013-11-30 MED ORDER — SODIUM CHLORIDE 0.9 % IV SOLN
INTRAVENOUS | Status: AC
  Administered 2013-11-30: 10:00:00 via INTRAVENOUS
  Administered 2013-11-30: 1000 mL via INTRAVENOUS

## 2013-11-30 MED ORDER — ONDANSETRON HCL 4 MG/2ML IJ SOLN: 4.0000 mg | Freq: Four times a day (QID) | INTRAMUSCULAR | Status: DC | PRN

## 2013-11-30 MED ORDER — ENSURE COMPLETE PO LIQD
237.0000 mL | Freq: Two times a day (BID) | ORAL | Status: DC
  Administered 2013-11-30: 237 mL via ORAL

## 2013-11-30 MED ORDER — DARIFENACIN HYDROBROMIDE ER 7.5 MG PO TB24
7.5000 mg | ORAL_TABLET | Freq: Every day | ORAL | Status: DC
Start: 2013-11-30 — End: 2013-12-01
  Administered 2013-11-30 – 2013-12-01 (×2): 7.5 mg via ORAL
  Filled 2013-11-30 (×3): qty 1

## 2013-11-30 MED ORDER — CARVEDILOL 3.125 MG PO TABS
3.1250 mg | ORAL_TABLET | Freq: Two times a day (BID) | ORAL | Status: DC
  Administered 2013-11-30 – 2013-12-01 (×3): 3.125 mg via ORAL
  Filled 2013-11-30 (×3): qty 1

## 2013-11-30 MED ORDER — OXYCODONE HCL 5 MG PO TABS
5.0000 mg | ORAL_TABLET | ORAL | Status: DC | PRN
  Administered 2013-11-30: 5 mg via ORAL
  Filled 2013-11-30: qty 1

## 2013-11-30 MED ORDER — POLYSACCHARIDE IRON COMPLEX 150 MG PO CAPS
150.0000 mg | ORAL_CAPSULE | Freq: Every day | ORAL | Status: DC
  Administered 2013-11-30 – 2013-12-01 (×2): 150 mg via ORAL
  Filled 2013-11-30 (×2): qty 1

## 2013-11-30 MED ORDER — LATANOPROST 0.005 % OP SOLN
1.0000 [drp] | Freq: Every day | OPHTHALMIC | Status: DC
  Administered 2013-11-30: 1 [drp] via OPHTHALMIC
  Filled 2013-11-30: qty 2.5

## 2013-11-30 MED ORDER — MUPIROCIN 2 % EX OINT
1.0000 | TOPICAL_OINTMENT | Freq: Two times a day (BID) | CUTANEOUS | Status: DC
Start: 2013-11-30 — End: 2013-12-01
  Administered 2013-11-30 – 2013-12-01 (×3): 1 via NASAL
  Filled 2013-11-30: qty 22

## 2013-11-30 MED ORDER — DOCUSATE SODIUM 100 MG PO CAPS
200.0000 mg | ORAL_CAPSULE | Freq: Two times a day (BID) | ORAL | Status: DC
  Administered 2013-11-30 – 2013-12-01 (×3): 200 mg via ORAL
  Filled 2013-11-30 (×3): qty 2

## 2013-11-30 MED ORDER — DEXTROSE 5 % IV SOLN
1.0000 g | INTRAVENOUS | Status: DC
  Administered 2013-11-30 – 2013-12-01 (×2): 1 g via INTRAVENOUS
  Filled 2013-11-30 (×3): qty 10

## 2013-11-30 MED ORDER — TERAZOSIN HCL 1 MG PO CAPS
1.0000 mg | ORAL_CAPSULE | Freq: Every day | ORAL | Status: DC
  Administered 2013-11-30: 1 mg via ORAL
  Filled 2013-11-30: qty 1

## 2013-11-30 MED ORDER — ASPIRIN 81 MG PO CHEW
81.0000 mg | CHEWABLE_TABLET | Freq: Every day | ORAL | Status: DC
  Administered 2013-11-30 – 2013-12-01 (×2): 81 mg via ORAL
  Filled 2013-11-30 (×2): qty 1

## 2013-11-30 MED ORDER — ACETAMINOPHEN 325 MG PO TABS: 650.0000 mg | ORAL_TABLET | Freq: Four times a day (QID) | ORAL | Status: DC | PRN

## 2013-11-30 MED ORDER — POLYETHYLENE GLYCOL 3350 17 G PO PACK
17.0000 g | PACK | Freq: Every day | ORAL | Status: DC
  Administered 2013-11-30 – 2013-12-01 (×2): 17 g via ORAL
  Filled 2013-11-30 (×2): qty 1

## 2013-11-30 MED ORDER — CHLORHEXIDINE GLUCONATE CLOTH 2 % EX PADS
6.0000 | MEDICATED_PAD | Freq: Every day | CUTANEOUS | Status: DC
  Administered 2013-12-01: 6 via TOPICAL

## 2013-11-30 MED ORDER — TORSEMIDE 20 MG PO TABS
50.0000 mg | ORAL_TABLET | Freq: Every day | ORAL | Status: DC
  Administered 2013-11-30: 50 mg via ORAL
  Filled 2013-11-30: qty 3

## 2013-11-30 MED ORDER — LORAZEPAM 0.5 MG PO TABS
0.5000 mg | ORAL_TABLET | Freq: Two times a day (BID) | ORAL | Status: DC
  Administered 2013-11-30 – 2013-12-01 (×3): 0.5 mg via ORAL
  Filled 2013-11-30 (×3): qty 1

## 2013-11-30 NOTE — Progress Notes (Signed)
Order obtained to discontinue consult with urology since hematuria has resolved.

## 2013-11-30 NOTE — Clinical Social Work Psychosocial (Signed)
Clinical Social Work Department BRIEF PSYCHOSOCIAL ASSESSMENT 11/30/2013  Patient:  Reginald Waller, Reginald Waller     Account Number:  [id number]     Admit date:  11/29/2013  Clinical Social Worker:  Edwyna Shell, CLINICAL SOCIAL WORKER  Date/Time:  11/30/2013 12:00 N  Referred by:  Physician  Date Referred:  11/30/2013 Referred for  ALF Placement   Other Referral:   Interview type:  Patient Other interview type:   Also spoke w niece in room    PSYCHOSOCIAL DATA Living Status:  [place] Admitted from facility:  [place] Level of care:   Primary support name:  Berneda Rose Primary support relationship to patient:  FAMILY Degree of support available:   Nieces are supportive, patient lives at [facility]  Other Concerns:    [place] / PLAN CSW met w patient and family at bedside, patient oriented x4, able to speak.  Was sent to [facility] from [facility] yesterday due to blood in urine, has UTI.  Patient and wife have lived at [facility] since March 2014.  Patient has history of bladder cancer, has had blood in his urine. Patient has no children, has been married 15 years, retired from [employer].  Two nieces help w psychosocial support for patient and his wife.  Niece questioned whether patient's catheter care needs to be addressed, including whether it is changed often enough or whether facility is caring for it adequately.  Also wondered whether patient should be on hospice care again due to his bladder cancer and decreased renal function.  CSW told niece she would speak w [employer].  Tried to call facility, but Loree Fee was not available.  Will make MD aware of family concerns and communicate w facility.    Patient is private pay at facility, did receive some therapy while a resident.  Last week was able to ambulate "down several hallways."  Gets limited assist w all ADLs, walks w walker and then only in therapy.   Mostlly in bed or in wheelchair.  Is able to visit w wife.  Has PCP at facility - Shelbie Proctor, MD.  MD visits facility weekly. [employer] Production designer, theatre/television/film) asked to address family concerns about catheter care and frequent UTIs. Family wondered about patient being placed back on hospice care, facility feels patient is improving w PT and does not recommend this at this time.  Patient states he wants to return home at some point if he can regain his strength.     Facility is willing to take patient back at discharge and feel they can meet his needs.  No concerns expressed.   Assessment/plan status:  Psychosocial Support/Ongoing Assessment of Needs Other assessment/ plan:   Information/referral to community resources:   [employer] return    PATIENT'S/FAMILY'S RESPONSE TO PLAN OF CARE: Niece wanted facility to address catheter care due to frequent UTIs.        Edwyna Shell, LCSW Clinical Social Worker [phone])

## 2013-11-30 NOTE — Progress Notes (Signed)
INITIAL NUTRITION ASSESSMENT  DOCUMENTATION CODES Per approved criteria  -Severe malnutrition in the context of chronic illness   INTERVENTION: Continue with current nutrition plan of care (pt already receives Ensure Complete po BID, each supplement provides 350 kcal and 13 grams of protein and 30 ml Prostat TID, which provides 300 kcals and 45 grams protein).   NUTRITION DIAGNOSIS: Inadequate oral intake related to decreased appetite as evidenced by fat and muscle wasting.   Goal: Pt will meet > 90% of estimated energy needs  Monitor:  PO intake, weights, labs, skin assessments, changes in status  Reason for Assessment: MST=3  80 y.o. male  Admitting Dx: UTI (urinary tract infection)  ASSESSMENT: This very pleasant gentleman resides at [facility] and was admitted for dysuria. Pt has a chronic foley due to bladder cancer.  Weight hx reveals a 33# (18%) wt loss x 1 year. Also noted a 2.8% wt gain x 6 months and a 2.1% wt gain x 3 months. Pt reports that he lost weight when he first came to [facility] and is is slowly trying to regain the weight he lost. He reports PO intake is generally good. He reports he ate all of his breakfast besides the oatmeal. Breakfast is generall his best meal and lunch and dinner can be variable, depending on what is served. Despite MAR records (which report pt receives Prostat TID and Ensure BID at [facility]), pt reports that he has not had any nutritional supplements "in a really long time".   Nutrition Focused Physical Exam:  Subcutaneous Fat:  Orbital Region: moderate loss Upper Arm Region: severe loss Thoracic and Lumbar Region: severe loss  Muscle:  Temple Region: severe loss Clavicle Bone Region: severe loss Clavicle and Acromion Bone Region: moderate loss Scapular Bone Region: severe loss Dorsal Hand: severe loss Patellar Region: severe loss Anterior Thigh Region: severe loss Posterior Calf Region: unable to assess  Edema:  none present  Pt meets criteria for severe MALNUTRITION in the context of chronic illness as evidenced by severe fat and muscle depletion.  Height: Ht Readings from Last 1 Encounters:  11/30/13 5\' 10"  (1.778 m)    Weight: Wt Readings from Last 1 Encounters:  11/30/13 149 lb 4 oz (67.7 kg)    Ideal Body Weight: 172#  % Ideal Body Weight: 87%  Wt Readings from Last 10 Encounters:  11/30/13 149 lb 4 oz (67.7 kg)  08/06/13 146 lb 9.7 oz (66.5 kg)  04/19/13 145 lb 6.1 oz (65.944 kg)  04/16/13 145 lb (65.772 kg)  04/04/13 151 lb 14.4 oz (68.9 kg)  03/22/13 150 lb (68.04 kg)  03/15/13 150 lb (68.04 kg)  12/17/12 166 lb 7.2 oz (75.5 kg)  12/12/12 182 lb 15.7 oz (83 kg)  12/12/12 182 lb 15.7 oz (83 kg)    Usual Body Weight: 154#  % Usual Body Weight: 97%  BMI:  Body mass index is 21.42 kg/(m^2). Meets criteria for normal weight  Estimated Nutritional Needs: Kcal: 1300-1400 daily Protein: 68-85 grams daily Fluid: 1.3-1.4 L daily  Skin: skin tear on lt lower leg  Diet Order: Dysphagia  EDUCATION NEEDS: -Education needs addressed   Intake/Output Summary (Last 24 hours) at 11/30/13 1115 Last data filed at 11/30/13 0500  Gross per 24 hour  Intake    125 ml  Output    550 ml  Net   -425 ml    Last BM:  11/29/13  Labs:   Recent Labs Lab 11/29/13 1945 11/30/13 0432  NA  137 139  K 4.9 4.7  CL 99 102  CO2 27 23  BUN 89* 84*  CREATININE 3.35* 2.90*  CALCIUM 8.8 8.6  GLUCOSE 133* 89    CBG (last 3)  No results found for this basename: GLUCAP,  in the last 72 hours  Scheduled Meds: . amLODipine  10 mg Oral Daily  . aspirin  81 mg Oral Daily  . carvedilol  3.125 mg Oral BID WC  . cefTRIAXone (ROCEPHIN)  IV  1 g Intravenous Q24H  . Chlorhexidine Gluconate Cloth  6 each Topical Q0600  . darifenacin  7.5 mg Oral Daily  . divalproex  125 mg Oral BID  . docusate sodium  200 mg Oral BID  . feeding supplement (ENSURE COMPLETE)  237 mL Oral BID BM  . feeding  supplement (PRO-STAT SUGAR FREE 64)  30 mL Oral TID WC  . iron polysaccharides  150 mg Oral Daily  . latanoprost  1 drop Both Eyes QHS  . levothyroxine  137 mcg Oral QAC breakfast  . LORazepam  0.5 mg Oral BID  . mupirocin ointment  1 application Nasal BID  . polyethylene glycol  17 g Oral Daily  . terazosin  1 mg Oral QHS  . torsemide  50 mg Oral Daily  . [START ON 12/01/2013] vancomycin  750 mg Intravenous Q48H    Continuous Infusions: . sodium chloride 75 mL/hr at 11/30/13 1009    Past Medical History  Diagnosis Date  . Hypertension   . Arthritis   . Stroke   . Malaria   . Anemia   . Hyperlipidemia   . Urothelial cancer 12/16/2012    Past Surgical History  Procedure Laterality Date  . Hernia repair    . Cataract extraction    . Appendectomy    . Eye surgery    . Cystoscopy  12/08/2012    Procedure: CYSTOSCOPY FLEXIBLE;  Surgeon: Marissa Nestle, MD;  Location: [facility];  Service: Urology;  Laterality: N/A;  . Transurethral resection of bladder tumor  12/09/2012    Procedure: TRANSURETHRAL RESECTION OF BLADDER TUMOR (TURBT);  Surgeon: Marissa Nestle, MD;  Location: [facility];  Service: Urology;  Laterality: N/A;   Aryaa Bunting A. Jimmye Norman, RD, LDN Pager: [phone]

## 2013-11-30 NOTE — Progress Notes (Signed)
PT Cancellation Note  Patient Details Name: Reginald Waller MRN: [id number] DOB: [birth year]   Cancelled Treatment:    Reason Eval/Treat Not Completed: Fatigue/lethargy limiting ability to participate Pt declines evaluation due to lack of sleep.  He is minimally ambulatory at [facility], primarily w/c dependent.  He is normally able to ambulate with a walker short distances with PT.  At d/c, he will need to follow up and continue with HHPT at [facility].  We will do an eval tomorrow if he is not discharged.  Demetrios Isaacs L 11/30/2013, 3:31 PM

## 2013-11-30 NOTE — Progress Notes (Signed)
UR chart review completed.  

## 2013-11-30 NOTE — Care Management Note (Addendum)
    Page 1 of 1   12/01/2013     10:02:06 AM   CARE MANAGEMENT NOTE 12/01/2013  Patient:  Reginald Waller, Reginald Waller   Account Number:  [id number]  Date Initiated:  11/30/2013  Documentation initiated by:  Theophilus Kinds  Subjective/Objective Assessment:   Pt admitted from [facility]. Pt will returnt to facility at discharge.     Action/Plan:   CSW to arrange discharge to facility when medically stable.   Anticipated DC Date:  12/03/2013   Anticipated DC Plan:  ASSISTED LIVING / REST HOME  In-house referral  Clinical Social Worker      DC Planning Services  CM consult      Choice offered to / List presented to:             Status of service:  Completed, signed off Medicare Important Message given?  NA - LOS <3 / Initial given by admissions (If response is "NO", the following Medicare IM given date fields will be blank) Date Medicare IM given:   Date Additional Medicare IM given:    Discharge Disposition:  ASSISTED LIVING  Per UR Regulation:    If discussed at Long Length of Stay Meetings, dates discussed:    Comments:  12/01/13 [place], RN BSN CM Pt discharged back to [facility]. CSW to arrange discharge to facility.  11/30/13 [place], RN BSN CM

## 2013-11-30 NOTE — ED Notes (Signed)
Patient was asleep on stretcher when entering the room. Vitals are WNL.

## 2013-11-30 NOTE — Progress Notes (Signed)
Pt is assessed, vital signs look stable. Report is given to Rn. Pt is ready for transfer.

## 2013-11-30 NOTE — Progress Notes (Signed)
[facility] PROGRESS NOTE  Reginald Waller YSA:[id number] DOB: 09/10/1919 DOA: 11/29/2013 PCP: Leonides Grills, MD  Assessment/Plan: 1. Hematuria with history of bladder cancer and chronic indwelling Foley. This appears to have resolved. Urology has been consulted. 2. Acute on chronic kidney failure. He is getting IV fluids. Renal function appears to be slowly improving. We'll continue to monitor urine output. Hold diuretics for now. 3. Urinary tract infection leukocytosis. He is on vancomycin Rocephin. Followup urine culture. He does not appear septic at this time. 4. Normocytic anemia with possible acute blood loss. Hemoglobin is trending down, this may also be delusional. We'll followup hemoglobin tomorrow. 5. Hypertension. Stable.  Code Status: DO NOT RESUSCITATE Family Communication: No family present Disposition Plan: Return to skilled nursing facility, possibly tomorrow   Consultants:  Urology  Procedures:  None  Antibiotics:  Vancomycin 1/4  Rocephin 1/5  HPI/Subjective: No new complaints, feeling better, urine in Foley bag appears to have cleared  Objective: Filed Vitals:   11/30/13 1005  BP: 150/61  Pulse: 71  Temp:   Resp:     Intake/Output Summary (Last 24 hours) at 11/30/13 1142 Last data filed at 11/30/13 0500  Gross per 24 hour  Intake    125 ml  Output    550 ml  Net   -425 ml   Filed Weights   11/29/13 2223 11/30/13 0304  Weight: 68.04 kg (150 lb) 67.7 kg (149 lb 4 oz)    Exam:   General:  No acute distress  Cardiovascular: S1, S2, regular rate and rhythm  Respiratory: Clear to auscultation bilaterally  Abdomen: Soft, nontender, positive bowel sounds  Musculoskeletal: No pedal edema bilaterally   Data Reviewed: Basic Metabolic Panel:  Recent Labs Lab 11/29/13 1945 11/30/13 0432  NA 137 139  K 4.9 4.7  CL 99 102  CO2 27 23  GLUCOSE 133* 89  BUN 89* 84*  CREATININE 3.35* 2.90*  CALCIUM 8.8 8.6   Liver Function  Tests:  Recent Labs Lab 11/30/13 0432  AST 12  ALT 5  ALKPHOS 72  BILITOT 0.3  PROT 6.7  ALBUMIN 2.7*   No results found for this basename: LIPASE, AMYLASE,  in the last 168 hours No results found for this basename: AMMONIA,  in the last 168 hours CBC:  Recent Labs Lab 11/29/13 1945 11/30/13 0432  WBC 20.5* 17.0*  NEUTROABS 17.9*  --   HGB 8.7* 8.1*  HCT 26.6* 24.2*  MCV 97.1 96.4  PLT 330 305   Cardiac Enzymes: No results found for this basename: CKTOTAL, CKMB, CKMBINDEX, TROPONINI,  in the last 168 hours BNP (last 3 results)  Recent Labs  04/19/13 0650 07/31/13 2103 08/05/13 1030  PROBNP 18596.0* 13832.0* 24573.0*   CBG: No results found for this basename: GLUCAP,  in the last 168 hours  Recent Results (from the past 240 hour(s))  MRSA PCR SCREENING     Status: Abnormal   Collection Time    11/30/13  3:00 AM      Result Value Range Status   MRSA by PCR POSITIVE (*) NEGATIVE Final   Comment:            The GeneXpert MRSA Assay (FDA     approved for NASAL specimens     only), is one component of a     comprehensive MRSA colonization     surveillance program. It is not     intended to diagnose MRSA     infection nor to guide or  monitor treatment for     MRSA infections.     RESULT CALLED TO, READ BACK BY AND VERIFIED WITH:      BREWER,D @ 0507 ON 11/30/13 BY WOODIE,J     Studies: No results found.  Scheduled Meds: . amLODipine  10 mg Oral Daily  . aspirin  81 mg Oral Daily  . carvedilol  3.125 mg Oral BID WC  . cefTRIAXone (ROCEPHIN)  IV  1 g Intravenous Q24H  . Chlorhexidine Gluconate Cloth  6 each Topical Q0600  . darifenacin  7.5 mg Oral Daily  . divalproex  125 mg Oral BID  . docusate sodium  200 mg Oral BID  . feeding supplement (ENSURE COMPLETE)  237 mL Oral BID BM  . feeding supplement (PRO-STAT SUGAR FREE 64)  30 mL Oral TID WC  . iron polysaccharides  150 mg Oral Daily  . latanoprost  1 drop Both Eyes QHS  . levothyroxine  137 mcg  Oral QAC breakfast  . LORazepam  0.5 mg Oral BID  . mupirocin ointment  1 application Nasal BID  . polyethylene glycol  17 g Oral Daily  . terazosin  1 mg Oral QHS  . torsemide  50 mg Oral Daily  . [START ON 12/01/2013] vancomycin  750 mg Intravenous Q48H   Continuous Infusions: . sodium chloride 75 mL/hr at 11/30/13 1009    Principal Problem:   UTI (urinary tract infection) Active Problems:   Bladder cancer   CKD (chronic kidney disease) stage 3, GFR 30-59 ml/min   Chronic indwelling Foley catheter   Acute on chronic renal failure   Hematuria   Protein-calorie malnutrition, severe    Time spent: 47mins    Reginald Waller  [facility] Pager [phone]. If 7PM-7AM, please contact night-coverage at [email or web address], password [email or web address] 11/30/2013, 11:42 AM  LOS: 1 day

## 2013-11-30 NOTE — Progress Notes (Signed)
Reginald Waller, pt's niece was notified about pt's transfer to room 303.

## 2013-11-30 NOTE — Progress Notes (Signed)
ANTIBIOTIC CONSULT NOTE - INITIAL  Pharmacy Consult for Vancomycin Indication: UTI  Allergies  Allergen Reactions  . Shellfish Allergy Other (See Comments)    Swelling of throat and tongue.   Patient Measurements: Height: 5\' 10"  (177.8 cm) Weight: 149 lb 4 oz (67.7 kg) IBW/kg (Calculated) : 73  Vital Signs: Temp: 97.6 F (36.4 C) (01/05 0830) Temp src: Oral (01/05 0830) BP: 150/61 mmHg (01/05 1005) Pulse Rate: 71 (01/05 1005) Intake/Output from previous day: 01/04 0701 - 01/05 0700 In: 125 [I.V.:75; IV Piggyback:50] Out: 550 [Urine:550] Intake/Output from this shift:    Labs:  Recent Labs  11/29/13 1945 11/30/13 0432  WBC 20.5* 17.0*  HGB 8.7* 8.1*  PLT 330 305  CREATININE 3.35* 2.90*   Estimated Creatinine Clearance: 15.6 ml/min (by C-G formula based on Cr of 2.9). No results found for this basename: VANCOTROUGH, Corlis Leak, VANCORANDOM, GENTTROUGH, GENTPEAK, GENTRANDOM, TOBRATROUGH, TOBRAPEAK, TOBRARND, AMIKACINPEAK, AMIKACINTROU, AMIKACIN,  in the last 72 hours   Microbiology: Recent Results (from the past 720 hour(s))  MRSA PCR SCREENING     Status: Abnormal   Collection Time    11/30/13  3:00 AM      Result Value Range Status   MRSA by PCR POSITIVE (*) NEGATIVE Final   Comment:            The GeneXpert MRSA Assay (FDA     approved for NASAL specimens     only), is one component of a     comprehensive MRSA colonization     surveillance program. It is not     intended to diagnose MRSA     infection nor to guide or     monitor treatment for     MRSA infections.     RESULT CALLED TO, READ BACK BY AND VERIFIED WITH:      BREWER,D @ 0507 ON 11/30/13 BY WOODIE,J   Medical History: Past Medical History  Diagnosis Date  . Hypertension   . Arthritis   . Stroke   . Malaria   . Anemia   . Hyperlipidemia   . Urothelial cancer 12/16/2012   Medications:  Scheduled:  . amLODipine  10 mg Oral Daily  . aspirin  81 mg Oral Daily  . carvedilol  3.125 mg Oral  BID WC  . cefTRIAXone (ROCEPHIN)  IV  1 g Intravenous Q24H  . Chlorhexidine Gluconate Cloth  6 each Topical Q0600  . darifenacin  7.5 mg Oral Daily  . divalproex  125 mg Oral BID  . docusate sodium  200 mg Oral BID  . feeding supplement (ENSURE COMPLETE)  237 mL Oral BID BM  . feeding supplement (PRO-STAT SUGAR FREE 64)  30 mL Oral TID WC  . iron polysaccharides  150 mg Oral Daily  . latanoprost  1 drop Both Eyes QHS  . levothyroxine  137 mcg Oral QAC breakfast  . LORazepam  0.5 mg Oral BID  . mupirocin ointment  1 application Nasal BID  . polyethylene glycol  17 g Oral Daily  . terazosin  1 mg Oral QHS  . torsemide  50 mg Oral Daily  . [START ON 12/01/2013] vancomycin  750 mg Intravenous Q48H   Assessment: 80yo male with elevated SCr and renal insufficiency.  Estimated Creatinine Clearance: 15.6 ml/min (by C-G formula based on Cr of 2.9).  Pt has h/o bladder cancer and chronic indwelling Foley catheter.  Presented to ED with blood in Foley bag.  Initiating Vancomycin and Rocephin for UTI.  Pt was given  initial doses of abx on admission.  Vancomycin 1/4 >> Rocephin 1/4 >>  Goal of Therapy:  Vancomycin trough level 10-15 mcg/ml Eradicate infection.  Plan:  Vancomycin 750mg  IV q48hrs (renally adjusted) Trough at steady state Monitor labs, renal fxn, and cultures  Hart Robinsons A 11/30/2013,10:58 AM

## 2013-12-01 LAB — BASIC METABOLIC PANEL
BUN: 75 mg/dL — ABNORMAL HIGH (ref 6–23)
CALCIUM: 8.5 mg/dL (ref 8.4–10.5)
CO2: 23 mEq/L (ref 19–32)
Chloride: 103 mEq/L (ref 96–112)
Creatinine, Ser: 2.83 mg/dL — ABNORMAL HIGH (ref 0.50–1.35)
GFR calc Af Amer: 21 mL/min — ABNORMAL LOW (ref >90)
GFR calc non Af Amer: 18 mL/min — ABNORMAL LOW (ref >90)
Glucose, Bld: 85 mg/dL (ref 70–99)
POTASSIUM: 4.1 meq/L (ref 3.7–5.3)
SODIUM: 140 meq/L (ref 137–147)

## 2013-12-01 LAB — CBC
HCT: 25.1 % — ABNORMAL LOW (ref 39.0–52.0)
HEMOGLOBIN: 8.4 g/dL — AB (ref 13.0–17.0)
MCH: 32.4 pg (ref 26.0–34.0)
MCHC: 33.5 g/dL (ref 30.0–36.0)
MCV: 96.9 fL (ref 78.0–100.0)
PLATELETS: 311 10*3/uL (ref 150–400)
RBC: 2.59 MIL/uL — ABNORMAL LOW (ref 4.22–5.81)
RDW: 14.7 % (ref 11.5–15.5)
WBC: 11.3 10*3/uL — ABNORMAL HIGH (ref 4.0–10.5)

## 2013-12-01 MED ORDER — OXYCODONE HCL 5 MG PO TABS: 5.0000 mg | ORAL_TABLET | Freq: Four times a day (QID) | ORAL | Status: AC | PRN

## 2013-12-01 MED ORDER — NITROFURANTOIN MONOHYD MACRO 100 MG PO CAPS
100.0000 mg | ORAL_CAPSULE | Freq: Two times a day (BID) | ORAL | Status: AC
Start: 2013-12-01 — End: 2013-12-07

## 2013-12-01 MED ORDER — MUPIROCIN 2 % EX OINT: 1.0000 "application " | TOPICAL_OINTMENT | Freq: Two times a day (BID) | CUTANEOUS | Status: AC

## 2013-12-01 NOTE — Discharge Summary (Signed)
Physician Discharge Summary  Reginald Waller [id number] DOB: 09-Nov-1919 DOA: 11/29/2013  PCP: Leonides Grills, MD  Admit date: 11/29/2013 Discharge date: 12/01/2013  Time spent: 40 minutes  Recommendations for Outpatient Follow-up:  1. Dr. Michela Pitcher in 1 week for evaluation of hematuria. To be arranged by facility.  2. PCP in 1 week to trend Hg and renal function. Recommend CBC and BMET 12/03/13 for evaluation of Hg and renal function.  3. Encourage po fluids Discharge Diagnoses:  Principal Problem:   UTI (urinary tract infection) Active Problems:   Bladder cancer   CKD (chronic kidney disease) stage 3, GFR 30-59 ml/min   Chronic indwelling Foley catheter   Acute on chronic renal failure   Hematuria   Protein-calorie malnutrition, severe   Discharge Condition: stable  Diet recommendation: dysphagia 3 thin liquid  Filed Weights   11/29/13 2223 11/30/13 0304  Weight: 68.04 kg (150 lb) 67.7 kg (149 lb 4 oz)    History of present illness:  Reginald Waller is a 80 y.o. male with a past medical history of hypertension, bladder cancer with a chronic indwelling Foley, who was brought in from his skilled nursing facility on 11/29/13 due to blood in the Foley bag. Apparently, they had seen clots as well. Patient denied any pain. No nausea, vomiting. Denied any fever. He was more concerned about the arthritis in his hips. Denied any recent falls or injuries. Not a very good historian.   Hospital Course:  1. Hematuria with history of bladder cancer and chronic indwelling Foley. This quickly resolved. Urology consult to be arranged outpatient for 7-10 days. At discharge foley with clear yellow urine.  2. Acute on chronic kidney failure. He was gently hydrated and diuretics were held and renal function improved. At discharge creatinine 2.8 from 3.35 on admission. Chart review indicated baseline range 2.3-2.6. Recommend bmet 12/03/13 to track renal function. Encourage po fluids.   3. Urinary tract  infection leukocytosis. He received vancomycin and Rocephin during this hospitalization.Urine culture with 80,000 staph species. Significance unclear. Culture 9/14 with MRSA and enterococcus sensitive to nitrofurantoin. Will discharge with 7 day course nitrofurantoin. He remained afebrile and non-toxic during this hospitalization.  4. Normocytic anemia with possible acute blood loss. Hg range 8.1-8.7. This is lower than what appears to be baseline of 10 per chart. May also be delusional somewhat as s/sx active bleeding resolved. Recommend cbc 12/03/13 to track Hg.  5. Hypertension. Remained stable during this hospitalization.    Procedures:  none  Consultations:  none  Discharge Exam: Filed Vitals:   12/01/13 0531  BP: 136/68  Pulse: 68  Temp: 97.8 F (36.6 C)  Resp: 18    General: sitting side of bed. Somewhat frail appearing. NAD Cardiovascular: RRR no m/g/r. No LE edema Respiratory: normal effort. BS clear bilaterally to auscultation  Discharge Instructions     Medication List         acetaminophen 500 MG tablet  Commonly known as:  TYLENOL  Take 500 mg by mouth every 6 (six) hours as needed. pain     amLODipine 10 MG tablet  Commonly known as:  NORVASC  Take 1 tablet (10 mg total) by mouth daily.     aspirin 81 MG chewable tablet  Chew 81 mg by mouth daily.     carvedilol 3.125 MG tablet  Commonly known as:  COREG  Take 1 tablet (3.125 mg total) by mouth 2 (two) times daily with a meal.     dextromethorphan-guaiFENesin 30-600 MG per  12 hr tablet  Commonly known as:  MUCINEX DM  Take 1 tablet by mouth every 12 (twelve) hours.     divalproex 125 MG capsule  Commonly known as:  DEPAKOTE SPRINKLE  Take 125 mg by mouth 2 (two) times daily.     docusate sodium 100 MG capsule  Commonly known as:  COLACE  Take 200 mg by mouth 2 (two) times daily.     feeding supplement (ENSURE COMPLETE) Liqd  Take 237 mLs by mouth 2 (two) times daily between meals.      feeding supplement (PRO-STAT SUGAR FREE 64) Liqd  Take 30 mLs by mouth 3 (three) times daily with meals.     latanoprost 0.005 % ophthalmic solution  Commonly known as:  XALATAN  Place 1 drop into both eyes at bedtime.     levothyroxine 137 MCG tablet  Commonly known as:  SYNTHROID, LEVOTHROID  Take 137 mcg by mouth daily.     LORazepam 0.5 MG tablet  Commonly known as:  ATIVAN  Take 0.5 mg by mouth 2 (two) times daily.     mupirocin ointment 2 %  Commonly known as:  BACTROBAN  Place 1 application into the nose 2 (two) times daily.     nitrofurantoin (macrocrystal-monohydrate) 100 MG capsule  Commonly known as:  MACROBID  Take 1 capsule (100 mg total) by mouth 2 (two) times daily.     oxyCODONE 5 MG immediate release tablet  Commonly known as:  Oxy IR/ROXICODONE  Take 1 tablet (5 mg total) by mouth every 6 (six) hours as needed for moderate pain.     POLY-IRON 150 150 MG capsule  Generic drug:  iron polysaccharides  Take 150 mg by mouth daily.     polyethylene glycol packet  Commonly known as:  MIRALAX / GLYCOLAX  Take 17 g by mouth daily.     PROAIR HFA 108 (90 BASE) MCG/ACT inhaler  Generic drug:  albuterol  Inhale 2 puffs into the lungs 4 (four) times daily as needed for wheezing or shortness of breath. dyspnea     solifenacin 5 MG tablet  Commonly known as:  VESICARE  Take 5 mg by mouth daily.     terazosin 1 MG capsule  Commonly known as:  HYTRIN  Take 1 mg by mouth at bedtime.     torsemide 20 MG tablet  Commonly known as:  DEMADEX  Take 2.5 tablets (50 mg total) by mouth daily.     zinc oxide 20 % ointment  Apply 1 application topically 2 (two) times daily.       Allergies  Allergen Reactions  . Shellfish Allergy Other (See Comments)    Swelling of throat and tongue.      The results of significant diagnostics from this hospitalization (including imaging, microbiology, ancillary and laboratory) are listed below for reference.    Significant  Diagnostic Studies: No results found.  Microbiology: Recent Results (from the past 240 hour(s))  URINE CULTURE     Status: None   Collection Time    11/29/13  6:27 PM      Result Value Range Status   Specimen Description URINE, CATHETERIZED   Final   Special Requests NONE   Final   Culture  Setup Time     Final   Value: 11/29/2013 22:00     Performed at [facility]     Final   Value: 80,000 COLONIES/ML     Performed at [facility]  Culture     Final   Value: STAPHYLOCOCCUS SPECIES     Note: RIFAMPIN AND GENTAMICIN SHOULD NOT BE USED AS SINGLE DRUGS FOR TREATMENT OF STAPH INFECTIONS.     Performed at [facility]   Report Status PENDING   Incomplete  MRSA PCR SCREENING     Status: Abnormal   Collection Time    11/30/13  3:00 AM      Result Value Range Status   MRSA by PCR POSITIVE (*) NEGATIVE Final   Comment:            The GeneXpert MRSA Assay (FDA     approved for NASAL specimens     only), is one component of a     comprehensive MRSA colonization     surveillance program. It is not     intended to diagnose MRSA     infection nor to guide or     monitor treatment for     MRSA infections.     RESULT CALLED TO, READ BACK BY AND VERIFIED WITH:      BREWER,D @ 0507 ON 11/30/13 BY WOODIE,J     Labs: Basic Metabolic Panel:  Recent Labs Lab 11/29/13 1945 11/30/13 0432 12/01/13 0501  NA 137 139 140  K 4.9 4.7 4.1  CL 99 102 103  CO2 27 23 23   GLUCOSE 133* 89 85  BUN 89* 84* 75*  CREATININE 3.35* 2.90* 2.83*  CALCIUM 8.8 8.6 8.5   Liver Function Tests:  Recent Labs Lab 11/30/13 0432  AST 12  ALT 5  ALKPHOS 72  BILITOT 0.3  PROT 6.7  ALBUMIN 2.7*   No results found for this basename: LIPASE, AMYLASE,  in the last 168 hours No results found for this basename: AMMONIA,  in the last 168 hours CBC:  Recent Labs Lab 11/29/13 1945 11/30/13 0432 12/01/13 0501  WBC 20.5* 17.0* 11.3*  NEUTROABS 17.9*  --   --    HGB 8.7* 8.1* 8.4*  HCT 26.6* 24.2* 25.1*  MCV 97.1 96.4 96.9  PLT 330 305 311   Cardiac Enzymes: No results found for this basename: CKTOTAL, CKMB, CKMBINDEX, TROPONINI,  in the last 168 hours BNP: BNP (last 3 results)  Recent Labs  04/19/13 0650 07/31/13 2103 08/05/13 1030  PROBNP 18596.0* 13832.0* 24573.0*   CBG: No results found for this basename: GLUCAP,  in the last 168 hours     Signed:  Radene Gunning  [facility] 12/01/2013, 9:24 AM

## 2013-12-01 NOTE — Clinical Social Work Note (Signed)
Patient ready for discharge today, will return to [facility] by facility transport van.  FL2 reviewed w RN and updated as needed.  Discharge summary faxed to facility.  Family, facility, and patient informed and agreeable to transfer today.  CSW asked [place] to address issues of catheter care raised by family, Loree Fee agreed to talk to [facility] RN from [facility] re these issues.  Patient anxious to return to facility where his wife also lives.  Edwyna Shell, LCSW Clinical Social Worker [phone])

## 2013-12-01 NOTE — Clinical Social Work Note (Signed)
[facility] will arrive approx 3:30 to transport patient back to facility.  Edwyna Shell, LCSW Clinical Social Worker [phone])

## 2013-12-01 NOTE — Discharge Summary (Signed)
Patient seen and examined.  Agree with note as above.  Leukocytosis improving, renal function improving. Hematuria resolved.  Urine culture growing staph species.  Will place on nitrofurantoin since last culture grew MRSA sensitive to nitrofurantoin. Will plan urology follow up in 1 week.  Hemoglobin stable.  Ready for discharge today.  MEMON,JEHANZEB

## 2013-12-02 LAB — URINE CULTURE

## 2014-01-07 ENCOUNTER — Encounter (HOSPITAL_COMMUNITY): Payer: Self-pay | Admitting: Emergency Medicine

## 2014-01-07 NOTE — ED Notes (Signed)
ems callled by nursing home personal for "congestion". Pt states he's been sob for 2 or 3 days

## 2014-01-08 ENCOUNTER — Inpatient Hospital Stay (HOSPITAL_COMMUNITY)
Admission: EM | Admit: 2014-01-08 | Discharge: 2014-01-12 | DRG: 699 | Disposition: A | Discharge status: Nursing Home (Includes ICF) | Payer: Medicare Other | Attending: Internal Medicine | Admitting: Internal Medicine

## 2014-01-08 ENCOUNTER — Emergency Department (HOSPITAL_COMMUNITY): Payer: Medicare Other

## 2014-01-08 ENCOUNTER — Inpatient Hospital Stay (HOSPITAL_COMMUNITY): Payer: Medicare Other

## 2014-01-08 DIAGNOSIS — E43 Unspecified severe protein-calorie malnutrition: Secondary | ICD-10-CM

## 2014-01-08 DIAGNOSIS — I509 Heart failure, unspecified: Secondary | ICD-10-CM

## 2014-01-08 DIAGNOSIS — Z91013 Allergy to seafood: Secondary | ICD-10-CM

## 2014-01-08 DIAGNOSIS — M129 Arthropathy, unspecified: Secondary | ICD-10-CM | POA: Diagnosis present

## 2014-01-08 DIAGNOSIS — J189 Pneumonia, unspecified organism: Secondary | ICD-10-CM

## 2014-01-08 DIAGNOSIS — Z833 Family history of diabetes mellitus: Secondary | ICD-10-CM

## 2014-01-08 DIAGNOSIS — Y846 Urinary catheterization as the cause of abnormal reaction of the patient, or of later complication, without mention of misadventure at the time of the procedure: Secondary | ICD-10-CM | POA: Diagnosis present

## 2014-01-08 DIAGNOSIS — D631 Anemia in chronic kidney disease: Secondary | ICD-10-CM | POA: Diagnosis present

## 2014-01-08 DIAGNOSIS — Z8249 Family history of ischemic heart disease and other diseases of the circulatory system: Secondary | ICD-10-CM

## 2014-01-08 DIAGNOSIS — J9811 Atelectasis: Secondary | ICD-10-CM

## 2014-01-08 DIAGNOSIS — I5032 Chronic diastolic (congestive) heart failure: Secondary | ICD-10-CM | POA: Diagnosis present

## 2014-01-08 DIAGNOSIS — E785 Hyperlipidemia, unspecified: Secondary | ICD-10-CM | POA: Diagnosis present

## 2014-01-08 DIAGNOSIS — Z66 Do not resuscitate: Secondary | ICD-10-CM | POA: Diagnosis present

## 2014-01-08 DIAGNOSIS — I10 Essential (primary) hypertension: Secondary | ICD-10-CM

## 2014-01-08 DIAGNOSIS — E440 Moderate protein-calorie malnutrition: Secondary | ICD-10-CM

## 2014-01-08 DIAGNOSIS — N183 Chronic kidney disease, stage 3 unspecified: Secondary | ICD-10-CM

## 2014-01-08 DIAGNOSIS — Z79899 Other long term (current) drug therapy: Secondary | ICD-10-CM

## 2014-01-08 DIAGNOSIS — T83511A Infection and inflammatory reaction due to indwelling urethral catheter, initial encounter: Principal | ICD-10-CM | POA: Diagnosis present

## 2014-01-08 DIAGNOSIS — N1330 Unspecified hydronephrosis: Secondary | ICD-10-CM

## 2014-01-08 DIAGNOSIS — Z806 Family history of leukemia: Secondary | ICD-10-CM

## 2014-01-08 DIAGNOSIS — R319 Hematuria, unspecified: Secondary | ICD-10-CM

## 2014-01-08 DIAGNOSIS — L03114 Cellulitis of left upper limb: Secondary | ICD-10-CM

## 2014-01-08 DIAGNOSIS — D62 Acute posthemorrhagic anemia: Secondary | ICD-10-CM

## 2014-01-08 DIAGNOSIS — E860 Dehydration: Secondary | ICD-10-CM

## 2014-01-08 DIAGNOSIS — N184 Chronic kidney disease, stage 4 (severe): Secondary | ICD-10-CM | POA: Diagnosis present

## 2014-01-08 DIAGNOSIS — J810 Acute pulmonary edema: Secondary | ICD-10-CM

## 2014-01-08 DIAGNOSIS — N390 Urinary tract infection, site not specified: Secondary | ICD-10-CM

## 2014-01-08 DIAGNOSIS — E875 Hyperkalemia: Secondary | ICD-10-CM

## 2014-01-08 DIAGNOSIS — Z8673 Personal history of transient ischemic attack (TIA), and cerebral infarction without residual deficits: Secondary | ICD-10-CM

## 2014-01-08 DIAGNOSIS — Z960 Presence of urogenital implants: Secondary | ICD-10-CM

## 2014-01-08 DIAGNOSIS — N329 Bladder disorder, unspecified: Secondary | ICD-10-CM | POA: Diagnosis present

## 2014-01-08 DIAGNOSIS — N179 Acute kidney failure, unspecified: Secondary | ICD-10-CM

## 2014-01-08 DIAGNOSIS — I5031 Acute diastolic (congestive) heart failure: Secondary | ICD-10-CM

## 2014-01-08 DIAGNOSIS — I639 Cerebral infarction, unspecified: Secondary | ICD-10-CM

## 2014-01-08 DIAGNOSIS — I129 Hypertensive chronic kidney disease with stage 1 through stage 4 chronic kidney disease, or unspecified chronic kidney disease: Secondary | ICD-10-CM | POA: Diagnosis present

## 2014-01-08 DIAGNOSIS — R1310 Dysphagia, unspecified: Secondary | ICD-10-CM | POA: Diagnosis present

## 2014-01-08 DIAGNOSIS — D509 Iron deficiency anemia, unspecified: Secondary | ICD-10-CM | POA: Diagnosis present

## 2014-01-08 DIAGNOSIS — N3289 Other specified disorders of bladder: Secondary | ICD-10-CM

## 2014-01-08 DIAGNOSIS — C679 Malignant neoplasm of bladder, unspecified: Secondary | ICD-10-CM

## 2014-01-08 DIAGNOSIS — N138 Other obstructive and reflux uropathy: Secondary | ICD-10-CM | POA: Diagnosis present

## 2014-01-08 DIAGNOSIS — B965 Pseudomonas (aeruginosa) (mallei) (pseudomallei) as the cause of diseases classified elsewhere: Secondary | ICD-10-CM | POA: Diagnosis present

## 2014-01-08 DIAGNOSIS — N039 Chronic nephritic syndrome with unspecified morphologic changes: Secondary | ICD-10-CM

## 2014-01-08 DIAGNOSIS — R0602 Shortness of breath: Secondary | ICD-10-CM

## 2014-01-08 DIAGNOSIS — N189 Chronic kidney disease, unspecified: Secondary | ICD-10-CM

## 2014-01-08 DIAGNOSIS — R339 Retention of urine, unspecified: Secondary | ICD-10-CM | POA: Diagnosis present

## 2014-01-08 DIAGNOSIS — E039 Hypothyroidism, unspecified: Secondary | ICD-10-CM

## 2014-01-08 DIAGNOSIS — J90 Pleural effusion, not elsewhere classified: Secondary | ICD-10-CM

## 2014-01-08 DIAGNOSIS — Z8559 Personal history of malignant neoplasm of other urinary tract organ: Secondary | ICD-10-CM

## 2014-01-08 DIAGNOSIS — Z7982 Long term (current) use of aspirin: Secondary | ICD-10-CM

## 2014-01-08 DIAGNOSIS — Z978 Presence of other specified devices: Secondary | ICD-10-CM

## 2014-01-08 DIAGNOSIS — D649 Anemia, unspecified: Secondary | ICD-10-CM

## 2014-01-08 DIAGNOSIS — Z87891 Personal history of nicotine dependence: Secondary | ICD-10-CM

## 2014-01-08 DIAGNOSIS — Z9889 Other specified postprocedural states: Secondary | ICD-10-CM

## 2014-01-08 DIAGNOSIS — N401 Enlarged prostate with lower urinary tract symptoms: Secondary | ICD-10-CM | POA: Diagnosis present

## 2014-01-08 DIAGNOSIS — Z8551 Personal history of malignant neoplasm of bladder: Secondary | ICD-10-CM

## 2014-01-08 DIAGNOSIS — N289 Disorder of kidney and ureter, unspecified: Secondary | ICD-10-CM

## 2014-01-08 LAB — URINALYSIS, ROUTINE W REFLEX MICROSCOPIC
Bilirubin Urine: NEGATIVE
Glucose, UA: NEGATIVE mg/dL
Ketones, ur: NEGATIVE mg/dL
Nitrite: NEGATIVE
PROTEIN: 100 mg/dL — AB
SPECIFIC GRAVITY, URINE: 1.02 (ref 1.005–1.030)
UROBILINOGEN UA: 0.2 mg/dL (ref 0.0–1.0)
pH: 7.5 (ref 5.0–8.0)

## 2014-01-08 LAB — CBC
HCT: 27.1 % — ABNORMAL LOW (ref 39.0–52.0)
HEMOGLOBIN: 9 g/dL — AB (ref 13.0–17.0)
MCH: 32.1 pg (ref 26.0–34.0)
MCHC: 33.2 g/dL (ref 30.0–36.0)
MCV: 96.8 fL (ref 78.0–100.0)
Platelets: 295 10*3/uL (ref 150–400)
RBC: 2.8 MIL/uL — AB (ref 4.22–5.81)
RDW: 16 % — ABNORMAL HIGH (ref 11.5–15.5)
WBC: 25.9 10*3/uL — ABNORMAL HIGH (ref 4.0–10.5)

## 2014-01-08 LAB — TROPONIN I: Troponin I: <0.3 ng/mL (ref <0.30)

## 2014-01-08 LAB — CBC WITH DIFFERENTIAL/PLATELET
BASOS ABS: 0 10*3/uL (ref 0.0–0.1)
BASOS PCT: 0 % (ref 0–1)
EOS ABS: 0.3 10*3/uL (ref 0.0–0.7)
EOS PCT: 1 % (ref 0–5)
HEMATOCRIT: 27.5 % — AB (ref 39.0–52.0)
Hemoglobin: 9.3 g/dL — ABNORMAL LOW (ref 13.0–17.0)
Lymphocytes Relative: 5 % — ABNORMAL LOW (ref 12–46)
Lymphs Abs: 0.7 10*3/uL (ref 0.7–4.0)
MCH: 32.5 pg (ref 26.0–34.0)
MCHC: 33.8 g/dL (ref 30.0–36.0)
MCV: 96.2 fL (ref 78.0–100.0)
MONO ABS: 1.6 10*3/uL — AB (ref 0.1–1.0)
Monocytes Relative: 8 % (ref 3–12)
Neutro Abs: 16.6 10*3/uL — ABNORMAL HIGH (ref 1.7–7.7)
Neutrophils Relative %: 86 % — ABNORMAL HIGH (ref 43–77)
Platelets: 319 10*3/uL (ref 150–400)
RBC: 2.86 MIL/uL — ABNORMAL LOW (ref 4.22–5.81)
RDW: 15.7 % — AB (ref 11.5–15.5)
WBC: 19.2 10*3/uL — ABNORMAL HIGH (ref 4.0–10.5)

## 2014-01-08 LAB — URINE MICROSCOPIC-ADD ON

## 2014-01-08 LAB — COMPREHENSIVE METABOLIC PANEL
ALBUMIN: 2.9 g/dL — AB (ref 3.5–5.2)
ALT: 6 U/L (ref 0–53)
AST: 13 U/L (ref 0–37)
Alkaline Phosphatase: 74 U/L (ref 39–117)
BILIRUBIN TOTAL: 0.3 mg/dL (ref 0.3–1.2)
BUN: 62 mg/dL — ABNORMAL HIGH (ref 6–23)
CALCIUM: 8.4 mg/dL (ref 8.4–10.5)
CHLORIDE: 105 meq/L (ref 96–112)
CO2: 22 meq/L (ref 19–32)
Creatinine, Ser: 3.19 mg/dL — ABNORMAL HIGH (ref 0.50–1.35)
GFR calc Af Amer: 18 mL/min — ABNORMAL LOW (ref >90)
GFR, EST NON AFRICAN AMERICAN: 16 mL/min — AB (ref >90)
Glucose, Bld: 121 mg/dL — ABNORMAL HIGH (ref 70–99)
Potassium: 5.3 mEq/L (ref 3.7–5.3)
Sodium: 141 mEq/L (ref 137–147)
Total Protein: 6.8 g/dL (ref 6.0–8.3)

## 2014-01-08 LAB — BASIC METABOLIC PANEL
BUN: 61 mg/dL — ABNORMAL HIGH (ref 6–23)
CALCIUM: 8.4 mg/dL (ref 8.4–10.5)
CO2: 22 mEq/L (ref 19–32)
CREATININE: 3.1 mg/dL — AB (ref 0.50–1.35)
Chloride: 101 mEq/L (ref 96–112)
GFR calc Af Amer: 19 mL/min — ABNORMAL LOW (ref >90)
GFR, EST NON AFRICAN AMERICAN: 16 mL/min — AB (ref >90)
Glucose, Bld: 120 mg/dL — ABNORMAL HIGH (ref 70–99)
Potassium: 5 mEq/L (ref 3.7–5.3)
Sodium: 141 mEq/L (ref 137–147)

## 2014-01-08 LAB — INFLUENZA PANEL BY PCR (TYPE A & B)
H1N1 flu by pcr: NOT DETECTED
INFLAPCR: NEGATIVE
Influenza B By PCR: NEGATIVE

## 2014-01-08 LAB — POCT I-STAT TROPONIN I: TROPONIN I, POC: 0.03 ng/mL (ref 0.00–0.08)

## 2014-01-08 LAB — MRSA PCR SCREENING: MRSA by PCR: POSITIVE — AB

## 2014-01-08 LAB — PRO B NATRIURETIC PEPTIDE: Pro B Natriuretic peptide (BNP): 14330 pg/mL — ABNORMAL HIGH (ref 0–450)

## 2014-01-08 MED ORDER — DIVALPROEX SODIUM 125 MG PO CPSP
125.0000 mg | ORAL_CAPSULE | Freq: Two times a day (BID) | ORAL | Status: DC
  Administered 2014-01-08 – 2014-01-12 (×7): 125 mg via ORAL
  Filled 2014-01-08 (×11): qty 1

## 2014-01-08 MED ORDER — POLYETHYLENE GLYCOL 3350 17 G PO PACK
17.0000 g | PACK | Freq: Every day | ORAL | Status: DC
  Administered 2014-01-08 – 2014-01-11 (×4): 17 g via ORAL
  Filled 2014-01-08 (×5): qty 1

## 2014-01-08 MED ORDER — SODIUM CHLORIDE 0.9 % IJ SOLN
3.0000 mL | Freq: Two times a day (BID) | INTRAMUSCULAR | Status: DC
  Administered 2014-01-08 – 2014-01-11 (×2): 3 mL via INTRAVENOUS

## 2014-01-08 MED ORDER — DOCUSATE SODIUM 100 MG PO CAPS
200.0000 mg | ORAL_CAPSULE | Freq: Two times a day (BID) | ORAL | Status: DC
  Administered 2014-01-08 – 2014-01-10 (×6): 200 mg via ORAL
  Filled 2014-01-08 (×7): qty 2

## 2014-01-08 MED ORDER — DEXTROSE 5 % IV SOLN
INTRAVENOUS | Status: AC
  Filled 2014-01-08: qty 10

## 2014-01-08 MED ORDER — CARVEDILOL 3.125 MG PO TABS
3.1250 mg | ORAL_TABLET | Freq: Two times a day (BID) | ORAL | Status: DC
  Administered 2014-01-08 – 2014-01-12 (×6): 3.125 mg via ORAL
  Filled 2014-01-08 (×8): qty 1

## 2014-01-08 MED ORDER — DEXTROSE 5 % IV SOLN
1.0000 g | INTRAVENOUS | Status: DC
  Administered 2014-01-08: 1 g via INTRAVENOUS
  Filled 2014-01-08 (×3): qty 10

## 2014-01-08 MED ORDER — DARIFENACIN HYDROBROMIDE ER 7.5 MG PO TB24
7.5000 mg | ORAL_TABLET | Freq: Every day | ORAL | Status: DC
  Administered 2014-01-08 – 2014-01-09 (×2): 7.5 mg via ORAL
  Filled 2014-01-08 (×6): qty 1

## 2014-01-08 MED ORDER — DEXTROSE 5 % IV SOLN
1.0000 g | Freq: Once | INTRAVENOUS | Status: AC
  Administered 2014-01-08: 1 g via INTRAVENOUS
  Filled 2014-01-08: qty 10

## 2014-01-08 MED ORDER — ACETAMINOPHEN 325 MG PO TABS: 650.0000 mg | ORAL_TABLET | Freq: Four times a day (QID) | ORAL | Status: DC | PRN

## 2014-01-08 MED ORDER — ACETAMINOPHEN 650 MG RE SUPP: 650.0000 mg | Freq: Four times a day (QID) | RECTAL | Status: DC | PRN

## 2014-01-08 MED ORDER — ACETAMINOPHEN 500 MG PO TABS: 500.0000 mg | ORAL_TABLET | Freq: Four times a day (QID) | ORAL | Status: DC | PRN

## 2014-01-08 MED ORDER — ONDANSETRON HCL 4 MG/2ML IJ SOLN
4.0000 mg | Freq: Once | INTRAMUSCULAR | Status: DC
  Filled 2014-01-08: qty 2

## 2014-01-08 MED ORDER — LATANOPROST 0.005 % OP SOLN
OPHTHALMIC | Status: AC
  Filled 2014-01-08: qty 2.5

## 2014-01-08 MED ORDER — SODIUM CHLORIDE 0.9 % IV SOLN
250.0000 mL | INTRAVENOUS | Status: DC | PRN
  Administered 2014-01-08: 250 mL via INTRAVENOUS

## 2014-01-08 MED ORDER — LORAZEPAM 0.5 MG PO TABS
0.5000 mg | ORAL_TABLET | Freq: Two times a day (BID) | ORAL | Status: DC
  Administered 2014-01-08 – 2014-01-12 (×7): 0.5 mg via ORAL
  Filled 2014-01-08 (×9): qty 1

## 2014-01-08 MED ORDER — ONDANSETRON HCL 4 MG/2ML IJ SOLN
4.0000 mg | Freq: Once | INTRAMUSCULAR | Status: AC
  Administered 2014-01-08: 4 mg via INTRAVENOUS

## 2014-01-08 MED ORDER — SODIUM CHLORIDE 0.9 % IJ SOLN
3.0000 mL | INTRAMUSCULAR | Status: DC | PRN
Start: 2014-01-08 — End: 2014-01-12

## 2014-01-08 MED ORDER — TERAZOSIN HCL 1 MG PO CAPS
1.0000 mg | ORAL_CAPSULE | Freq: Every day | ORAL | Status: DC
  Administered 2014-01-08 – 2014-01-10 (×3): 1 mg via ORAL
  Filled 2014-01-08 (×3): qty 1

## 2014-01-08 MED ORDER — AMLODIPINE BESYLATE 5 MG PO TABS
10.0000 mg | ORAL_TABLET | Freq: Every day | ORAL | Status: DC
  Administered 2014-01-08 – 2014-01-12 (×4): 10 mg via ORAL
  Filled 2014-01-08 (×5): qty 2

## 2014-01-08 MED ORDER — LATANOPROST 0.005 % OP SOLN
1.0000 [drp] | Freq: Every day | OPHTHALMIC | Status: DC
  Administered 2014-01-08 – 2014-01-11 (×4): 1 [drp] via OPHTHALMIC
  Filled 2014-01-08: qty 2.5

## 2014-01-08 MED ORDER — ENSURE COMPLETE PO LIQD
237.0000 mL | Freq: Two times a day (BID) | ORAL | Status: DC
  Administered 2014-01-08 – 2014-01-10 (×4): 237 mL via ORAL

## 2014-01-08 MED ORDER — DM-GUAIFENESIN ER 30-600 MG PO TB12
1.0000 | ORAL_TABLET | Freq: Two times a day (BID) | ORAL | Status: DC
  Administered 2014-01-08 – 2014-01-10 (×6): 1 via ORAL
  Filled 2014-01-08 (×6): qty 1

## 2014-01-08 MED ORDER — PRO-STAT SUGAR FREE PO LIQD
30.0000 mL | Freq: Three times a day (TID) | ORAL | Status: DC
  Administered 2014-01-08 – 2014-01-11 (×6): 30 mL via ORAL
  Filled 2014-01-08 (×7): qty 30

## 2014-01-08 MED ORDER — OXYCODONE HCL 5 MG PO TABS
5.0000 mg | ORAL_TABLET | Freq: Four times a day (QID) | ORAL | Status: DC | PRN
  Administered 2014-01-09 – 2014-01-11 (×3): 5 mg via ORAL
  Filled 2014-01-08 (×3): qty 1

## 2014-01-08 MED ORDER — SODIUM CHLORIDE 0.9 % IJ SOLN
3.0000 mL | Freq: Two times a day (BID) | INTRAMUSCULAR | Status: DC
  Administered 2014-01-08 – 2014-01-11 (×3): 3 mL via INTRAVENOUS

## 2014-01-08 MED ORDER — LEVOTHYROXINE SODIUM 137 MCG PO TABS
137.0000 ug | ORAL_TABLET | Freq: Every day | ORAL | Status: DC
  Administered 2014-01-08 – 2014-01-12 (×3): 137 ug via ORAL
  Filled 2014-01-08 (×6): qty 1

## 2014-01-08 MED ORDER — ASPIRIN 81 MG PO CHEW
81.0000 mg | CHEWABLE_TABLET | Freq: Every day | ORAL | Status: DC
  Administered 2014-01-08 – 2014-01-12 (×3): 81 mg via ORAL
  Filled 2014-01-08 (×5): qty 1

## 2014-01-08 NOTE — ED Provider Notes (Signed)
80 year old male comes in with a cough nonproductive cough and chest congestion as well as dyspnea for the last 2 days. No fever or chills. On exam, there is no neck vein distention but there is trace presacral edema. Lungs have rales at both bases which is more prominent on the left. Scattered rhonchi are also present. Heart has regular rate and rhythm. There is no pretibial or pedal edema. His dyspnea could be related to an infectious process such as bronchitis or pneumonia but also need to consider possibility of CHF exacerbation. Old records have been reviewed and he has recent hospitalizations for congestive heart failure and bronchitis as well as renal insufficiency.  Chest x-ray shows cardiomegaly without evidence of pneumonia and is unchanged from most recent chest x-ray. BNP is elevated but decreased from the last level. Renal function is at about his baseline as is his anemia. However, there is marked leukocytosis with left shift. WBC is over 19,000. Because of this, I feel that he will need to be admitted. Urinalysis shows evidence of infection and he does have an indwelling Foley catheter. This is most likely the source of the leukocytosis. Case is discussed with Dr. Darrick Meigs of [facility] who agrees to admit the patient.  Results for orders placed during the hospital encounter of 01/08/14  CBC WITH DIFFERENTIAL      Result Value Ref Range   WBC 19.2 (*) 4.0 - 10.5 K/uL   RBC 2.86 (*) 4.22 - 5.81 MIL/uL   Hemoglobin 9.3 (*) 13.0 - 17.0 g/dL   HCT 27.5 (*) 39.0 - 52.0 %   MCV 96.2  78.0 - 100.0 fL   MCH 32.5  26.0 - 34.0 pg   MCHC 33.8  30.0 - 36.0 g/dL   RDW 15.7 (*) 11.5 - 15.5 %   Platelets 319  150 - 400 K/uL   Neutrophils Relative % 86 (*) 43 - 77 %   Neutro Abs 16.6 (*) 1.7 - 7.7 K/uL   Lymphocytes Relative 5 (*) 12 - 46 %   Lymphs Abs 0.7  0.7 - 4.0 K/uL   Monocytes Relative 8  3 - 12 %   Monocytes Absolute 1.6 (*) 0.1 - 1.0 K/uL   Eosinophils Relative 1  0 - 5 %   Eosinophils Absolute 0.3  0.0 - 0.7 K/uL   Basophils Relative 0  0 - 1 %   Basophils Absolute 0.0  0.0 - 0.1 K/uL  BASIC METABOLIC PANEL      Result Value Ref Range   Sodium 141  137 - 147 mEq/L   Potassium 5.0  3.7 - 5.3 mEq/L   Chloride 101  96 - 112 mEq/L   CO2 22  19 - 32 mEq/L   Glucose, Bld 120 (*) 70 - 99 mg/dL   BUN 61 (*) 6 - 23 mg/dL   Creatinine, Ser 3.10 (*) 0.50 - 1.35 mg/dL   Calcium 8.4  8.4 - 10.5 mg/dL   GFR calc non Af Amer 16 (*) >90 mL/min   GFR calc Af Amer 19 (*) >90 mL/min  PRO B NATRIURETIC PEPTIDE      Result Value Ref Range   Pro B Natriuretic peptide (BNP) 14330.0 (*) 0 - 450 pg/mL  URINALYSIS, ROUTINE W REFLEX MICROSCOPIC      Result Value Ref Range   Color, Urine YELLOW  YELLOW   APPearance HAZY (*) CLEAR   Specific Gravity, Urine 1.020  1.005 - 1.030   pH 7.5  5.0 - 8.0  Glucose, UA NEGATIVE  NEGATIVE mg/dL   Hgb urine dipstick LARGE (*) NEGATIVE   Bilirubin Urine NEGATIVE  NEGATIVE   Ketones, ur NEGATIVE  NEGATIVE mg/dL   Protein, ur 100 (*) NEGATIVE mg/dL   Urobilinogen, UA 0.2  0.0 - 1.0 mg/dL   Nitrite NEGATIVE  NEGATIVE   Leukocytes, UA MODERATE (*) NEGATIVE  URINE MICROSCOPIC-ADD ON      Result Value Ref Range   Squamous Epithelial / LPF FEW (*) RARE   WBC, UA TOO NUMEROUS TO COUNT  <3 WBC/hpf   RBC / HPF TOO NUMEROUS TO COUNT  <3 RBC/hpf   Bacteria, UA MANY (*) RARE  POCT I-STAT TROPONIN I      Result Value Ref Range   Troponin i, poc 0.03  0.00 - 0.08 ng/mL   Comment 3            Dg Chest 2 View  01/08/2014   CLINICAL DATA:  Congestion.  Short of breath.  EXAM: CHEST  2 VIEW  COMPARISON:  DG CHEST 1 VIEW dated 08/04/2013; DG CHEST 1V PORT dated 08/03/2013; DG CHEST 1 VIEW dated 07/31/2013; DG CHEST 1V PORT dated 12/15/2012; DG CHEST 2 VIEW dated 12/04/2012  FINDINGS: Cardiomegaly. Basilar atelectasis. Retrocardiac density is present likely representing atelectasis and effusion and combination. The effusion is best visualized on the lateral  view. Aortic arch atherosclerosis.  When compared to the most recent prior examinations, pulmonary aeration has improved although the retrocardiac density has persisted. It is difficult to completely exclude infection in the retrocardiac region however not favored.  IMPRESSION: 1. Cardiomegaly. 2. Retrocardiac density compatible with effusion and atelectasis. Pneumonia is difficult to exclude but not favored. No pulmonary edema.   Electronically Signed   By: Dereck Ligas M.D.   On: 01/08/2014 01:34   Images viewed by me.   Medical screening examination/treatment/procedure(s) were conducted as a shared visit with non-physician practitioner(s) and myself.  I personally evaluated the patient during the encounter.  EKG Interpretation    Date/Time:  Friday January 08 2014 00:20:37 EST Ventricular Rate:  85 PR Interval:    QRS Duration: 100 QT Interval:  394 QTC Calculation: 468 R Axis:   19 Text Interpretation:  Sinus rhythm with first degree AV block Septal infarct , age undetermined Abnormal ECG When compared with ECG of 31-Jul-2013 21:10, Septal infarct is now Present ST less depressed in Lateral leads Confirmed by Roxanne Mins  MD, Ottis Sarnowski ([id number]) on 01/08/2014 12:29:01 AM              Delora Fuel, MD 00/34/91 [id number]

## 2014-01-08 NOTE — ED Notes (Signed)
Patient has foley cath intact from [facility]

## 2014-01-08 NOTE — H&P (Signed)
PCP:   Leonides Grills, MD   Chief Complaint:  Shortness of breath  HPI: 80 year old male with a history of bladder cancer, CHF, anemia, CKD STAGE IV, hypertension who was sent from the nursing facility with chief complaint of shortness of breath. Patient is a poor historian and denies shortness of breath at this time, denies chest pain. Denies nausea vomiting or diarrhea. Denies fever Patient was recently discharged from the hospital after he was admitted with hematuria. Patient has chronic indwelling Foley catheter.  In the ED patient was found to have leukocytosis, with abnormal U[place]. [place]NP [place] [address].   Allergies:   Allergies  Allergen Reactions  . Shellfish Allergy Other (See Comments)    Swelling of throat and tongue.      Past Medical History  Diagnosis Date  . Hypertension   . Arthritis   . Stroke   . Malaria   . Anemia   . Hyperlipidemia   . Urothelial cancer 12/16/2012    Past Surgical History  Procedure Laterality Date  . Hernia repair    . Cataract extraction    . Appendectomy    . Eye surgery    . Cystoscopy  12/08/2012    Procedure: CYSTOSCOPY FLEXIBLE;  Surgeon: Marissa Nestle, MD;  Location: [facility];  Service: Urology;  Laterality: N/A;  . Transurethral resection of bladder tumor  12/09/2012    Procedure: TRANSURETHRAL RESECTION OF BLADDER TUMOR (TURBT);  Surgeon: Marissa Nestle, MD;  Location: [facility];  Service: Urology;  Laterality: N/A;    Prior to Admission medications   Medication Sig Start Date End Date Taking? Authorizing Provider  acetaminophen (TYLENOL) 500 MG tablet Take 500 mg by mouth every 6 (six) hours as needed. pain    Historical Provider, MD  albuterol (PROAIR HFA) 108 (90 BASE) MCG/ACT inhaler Inhale 2 puffs into the lungs 4 (four) times daily as needed for wheezing or shortness of breath. dyspnea    Historical Provider, MD  amLODipine (NORVASC) 10 MG tablet Take 1 tablet (10 mg total) by mouth daily. 12/11/12   Melton Alar, PA-C   aspirin 81 MG chewable tablet Chew 81 mg by mouth daily.    Historical Provider, MD  carvedilol (COREG) 3.125 MG tablet Take 1 tablet (3.125 mg total) by mouth 2 (two) times daily with a meal. 04/06/13   Radene Gunning, NP  dextromethorphan-guaiFENesin [facility] DM) 30-600 MG per 12 hr tablet Take 1 tablet by mouth every 12 (twelve) hours.    Historical Provider, MD  divalproex (DEPAKOTE SPRINKLE) 125 MG capsule Take 125 mg by mouth 2 (two) times daily.    Historical Provider, MD  docusate sodium (COLACE) 100 MG capsule Take 200 mg by mouth 2 (two) times daily.    Historical Provider, MD  feeding supplement (ENSURE COMPLETE) LIQD Take 237 mLs by mouth 2 (two) times daily between meals. 08/06/13   Nishant Dhungel, MD  feeding supplement (PRO-STAT SUGAR FREE 64) LIQD Take 30 mLs by mouth 3 (three) times daily with meals. 08/06/13   Nishant Dhungel, MD  iron polysaccharides (POLY-IRON 150) 150 MG capsule Take 150 mg by mouth daily.    Historical Provider, MD  latanoprost (XALATAN) 0.005 % ophthalmic solution Place 1 drop into both eyes at bedtime.    Historical Provider, MD  levothyroxine (SYNTHROID, LEVOTHROID) 137 MCG tablet Take 137 mcg by mouth daily.    Historical Provider, MD  LORazepam (ATIVAN) 0.5 MG tablet Take 0.5 mg by mouth 2 (two) times daily.  Historical Provider, MD  mupirocin ointment (BACTROBAN) 2 % Place 1 application into the nose 2 (two) times daily. 12/01/13   Radene Gunning, NP  oxyCODONE (OXY IR/ROXICODONE) 5 MG immediate release tablet Take 1 tablet (5 mg total) by mouth every 6 (six) hours as needed for moderate pain. 12/01/13   Radene Gunning, NP  polyethylene glycol (MIRALAX / GLYCOLAX) packet Take 17 g by mouth daily.    Historical Provider, MD  solifenacin (VESICARE) 5 MG tablet Take 5 mg by mouth daily.    Historical Provider, MD  terazosin (HYTRIN) 1 MG capsule Take 1 mg by mouth at bedtime.    Historical Provider, MD  torsemide (DEMADEX) 20 MG tablet Take 2.5 tablets (50 mg  total) by mouth daily. 08/06/13   Nishant Dhungel, MD  zinc oxide 20 % ointment Apply 1 application topically 2 (two) times daily.    Historical Provider, MD    Social History:  reports that he has quit smoking. He has quit using smokeless tobacco. He reports that he does not drink alcohol or use illicit drugs.  Family History  Problem Relation Age of Onset  . Diabetes Mother   . Diabetes Father   . Cancer Sister     breast  . Cancer Sister     thyroid  . Cancer Brother     leukemia  . Cancer Brother   . Coronary artery disease Brother      All the positives are listed in BOLD  Review of Systems:  HEENT: Headache, blurred vision, runny nose, sore throat Neck: Hypothyroidism, hyperthyroidism,,lymphadenopathy Chest : Shortness of breath, history of COPD, Asthma Heart : Chest pain, history of coronary arterey disease GI:  Nausea, vomiting, diarrhea, constipation, GERD GU: Dysuria, urgency, frequency of urination, hematuria, chronic indwelling Foley catheter Neuro: Stroke, seizures, syncope Psych: Depression, anxiety, hallucinations   Physical Exam: Blood pressure 156/50, pulse 86, temperature 100.3 F (37.9 C), temperature source Rectal, resp. rate 18, height 6' (1.829 m), weight 74.844 kg (165 lb), SpO2 94.00%. Constitutional:   Patient is a well-developed and well-nourished male* in no acute distress and cooperative with exam. Head: Normocephalic and atraumatic Mouth: Mucus membranes moist Eyes: PERRL, EOMI, conjunctivae normal Neck: Supple, No Thyromegaly Cardiovascular: RRR, S1 normal, S2 normal Pulmonary/Chest: CTAB, no wheezes, rales, or rhonchi Abdominal: Soft. Non-tender, non-distended, bowel sounds are normal, no masses, organomegaly, or guarding present.  Neurological: A&O x3, Strenght is normal and symmetric bilaterally, cranial nerve II-XII are grossly intact, no focal motor deficit, sensory intact to light touch bilaterally.  Extremities : No Cyanosis, Clubbing  or Edema   Labs on Admission:  Results for orders placed during the hospital encounter of 01/08/14 (from the past 48 hour(s))  CBC WITH DIFFERENTIAL     Status: Abnormal   Collection Time    01/08/14 12:13 AM      Result Value Ref Range   WBC 19.2 (*) 4.0 - 10.5 K/uL   RBC 2.86 (*) 4.22 - 5.81 MIL/uL   Hemoglobin 9.3 (*) 13.0 - 17.0 g/dL   HCT 27.5 (*) 39.0 - 52.0 %   MCV 96.2  78.0 - 100.0 fL   MCH 32.5  26.0 - 34.0 pg   MCHC 33.8  30.0 - 36.0 g/dL   RDW 15.7 (*) 11.5 - 15.5 %   Platelets 319  150 - 400 K/uL   Neutrophils Relative % 86 (*) 43 - 77 %   Neutro Abs 16.6 (*) 1.7 - 7.7 K/uL   Lymphocytes Relative  5 (*) 12 - 46 %   Lymphs Abs 0.7  0.7 - 4.0 K/uL   Monocytes Relative 8  3 - 12 %   Monocytes Absolute 1.6 (*) 0.1 - 1.0 K/uL   Eosinophils Relative 1  0 - 5 %   Eosinophils Absolute 0.3  0.0 - 0.7 K/uL   Basophils Relative 0  0 - 1 %   Basophils Absolute 0.0  0.0 - 0.1 K/uL  BASIC METABOLIC PANEL     Status: Abnormal   Collection Time    01/08/14 12:13 AM      Result Value Ref Range   Sodium 141  137 - 147 mEq/L   Potassium 5.0  3.7 - 5.3 mEq/L   Chloride 101  96 - 112 mEq/L   CO2 22  19 - 32 mEq/L   Glucose, Bld 120 (*) 70 - 99 mg/dL   BUN 61 (*) 6 - 23 mg/dL   Creatinine, Ser 3.10 (*) 0.50 - 1.35 mg/dL   Calcium 8.4  8.4 - 10.5 mg/dL   GFR calc non Af Amer 16 (*) >90 mL/min   GFR calc Af Amer 19 (*) >90 mL/min   Comment: (NOTE)     The eGFR has been calculated using the CKD EPI equation.     This calculation has not been validated in all clinical situations.     eGFR's persistently <90 mL/min signify possible Chronic Kidney     Disease.  PRO B NATRIURETIC PEPTIDE     Status: Abnormal   Collection Time    01/08/14 12:13 AM      Result Value Ref Range   Pro B Natriuretic peptide (BNP) 14330.0 (*) 0 - 450 pg/mL  POCT I-STAT TROPONIN I     Status: None   Collection Time    01/08/14 12:31 AM      Result Value Ref Range   Troponin i, poc 0.03  0.00 - 0.08  ng/mL   Comment 3            Comment: Due to the release kinetics of cTnI,     a negative result within the first hours     of the onset of symptoms does not rule out     myocardial infarction with certainty.     If myocardial infarction is still suspected,     repeat the test at appropriate intervals.  URINALYSIS, ROUTINE W REFLEX MICROSCOPIC     Status: Abnormal   Collection Time    01/08/14 12:45 AM      Result Value Ref Range   Color, Urine YELLOW  YELLOW   APPearance HAZY (*) CLEAR   Specific Gravity, Urine 1.020  1.005 - 1.030   pH 7.5  5.0 - 8.0   Glucose, UA NEGATIVE  NEGATIVE mg/dL   Hgb urine dipstick LARGE (*) NEGATIVE   Bilirubin Urine NEGATIVE  NEGATIVE   Ketones, ur NEGATIVE  NEGATIVE mg/dL   Protein, ur 100 (*) NEGATIVE mg/dL   Urobilinogen, UA 0.2  0.0 - 1.0 mg/dL   Nitrite NEGATIVE  NEGATIVE   Leukocytes, UA MODERATE (*) NEGATIVE  URINE MICROSCOPIC-ADD ON     Status: Abnormal   Collection Time    01/08/14 12:45 AM      Result Value Ref Range   Squamous Epithelial / LPF FEW (*) RARE   WBC, UA TOO NUMEROUS TO COUNT  <3 WBC/hpf   RBC / HPF TOO NUMEROUS TO COUNT  <3 RBC/hpf   Bacteria, UA MANY (*)  RARE    Radiological Exams on Admission: Dg Chest 2 View  01/08/2014   CLINICAL DATA:  Congestion.  Short of breath.  EXAM: CHEST  2 VIEW  COMPARISON:  DG CHEST 1 VIEW dated 08/04/2013; DG CHEST 1V PORT dated 08/03/2013; DG CHEST 1 VIEW dated 07/31/2013; DG CHEST 1V PORT dated 12/15/2012; DG CHEST 2 VIEW dated 12/04/2012  FINDINGS: Cardiomegaly. Basilar atelectasis. Retrocardiac density is present likely representing atelectasis and effusion and combination. The effusion is best visualized on the lateral view. Aortic arch atherosclerosis.  When compared to the most recent prior examinations, pulmonary aeration has improved although the retrocardiac density has persisted. It is difficult to completely exclude infection in the retrocardiac region however not favored.  IMPRESSION: 1.  Cardiomegaly. 2. Retrocardiac density compatible with effusion and atelectasis. Pneumonia is difficult to exclude but not favored. No pulmonary edema.   Electronically Signed   By: Dereck Ligas M.D.   On: 01/08/2014 01:34    Assessment/Plan Principal Problem:   Urinary tract infection Active Problems:   UTI (lower urinary tract infection)   Bladder mass   Acute renal failure   Hypertension   Hypothyroidism   Chronic indwelling Foley catheter  Hematuria  UTI Patient has abnormal UA with significant WBC and RBC in the urine. Patient does have chronic indwelling Foley catheter. Today patient is found to have leukocytosis with white count of 19,000. Patient was started on Rocephin in the ED. We'll continue with Rocephin, follow urine culture results.  Hematuria Patient has history of bladder cancer, he was supposed to follow with Dr. Michela Pitcher as outpatient in the previous admission. [facility] consult urology in the morning. Patient has chronic indwelling Foley catheter.  Acute on chronic kidney disease Mild elevation of creatinine about the baseline. Today creatinine is 3.10. Will hold diuretics at this time.  History of CHF Patient does not appear to be in CHF exacerbation, his breathing is at his baseline. BNP is chronically elevated. Will hold the Demodex at this time due to worsening renal function.    Code status: DO NOT RESUSCITATE as per nursing home records     Time Spent on Admission: 37 min  [facility] Pager: [phone] 01/08/2014, 3:25 AM  If 7PM-7AM, please contact night-coverage  [email or web address]  Password [email or web address]

## 2014-01-08 NOTE — Clinical Documentation Improvement (Signed)
A cause and effect relationship may not be assumed and must be documented by a provider. Please clarify the relationship, if any, between chronic indwelling catheter and UTI.  Are the conditions:   Due to or associated with each other   Other (please specify) ___________________   Unrelated to each other   Unable to determine   Unknown    Risk Factors: Patient does have a history of bladder cancer, follows up with Dr. Michela Pitcher In the ED patient was found to have leukocytosis, with abnormal UA. Patient has chronic indwelling Foley catheter  Sign & Symptoms: Hematuria  Diagnostics: Pending urine culture  Treatment: Will continue ceftriaxone  Will consult urology   Thank you, Estella Husk ,RN Clinical Documentation Specialist:  [facility]

## 2014-01-08 NOTE — Progress Notes (Signed)
INITIAL NUTRITION ASSESSMENT  DOCUMENTATION CODES Per approved criteria  -Not Applicable   INTERVENTION: Continue with current nutrition plan of care  NUTRITION DIAGNOSIS: Inadequate oral intake related to variable appetite as evidenced by PO: 25-100%.   Goal: Pt will meet >90% of estimated nutritional needs  Monitor:  PO intake, skin assessments, labs, weight changes, I/O  Reason for Assessment: MST=2  80 y.o. male  Admitting Dx: Urinary tract infection  ASSESSMENT: Pt admitted for UTI and SOB. Familiar to this RD due to multiple admissions. He is a resident of [place].  Noted a 16# (10.7%) wt gain x 1 month and a 0# (0.6%) wt loss x 1 year.  Intake is variable. Noted PO: 75% at breakfast. This RD noted that pt only ate pudding off tray for lunch (PO approximately 25% for lunch). Pt receives Ensure and Prostat at [facility] per [facility]. Noted orders for Ensure Complete po BID, whicht provides 700 kcal and 26 grams of protein, and 30 ml Prostat TID, which provides 300 kcals and 45 grams protein. Will continue with supplements to help support nutritional status and continuity of care.  Unable to arouse pt for physical exam, but noted no depletion in the eye and temple areas.  Noted that pt has been diagnosed with malnutrition during previous admissions. His nutritional status is improving. He does not meet criteria for malnutrition.   Height: Ht Readings from Last 1 Encounters:  01/08/14 6' (1.829 m)    Weight: Wt Readings from Last 1 Encounters:  01/08/14 165 lb (74.844 kg)    Ideal Body Weight: 178#  % Ideal Body Weight: 93%  Wt Readings from Last 10 Encounters:  01/08/14 165 lb (74.844 kg)  11/30/13 149 lb 4 oz (67.7 kg)  08/06/13 146 lb 9.7 oz (66.5 kg)  04/19/13 145 lb 6.1 oz (65.944 kg)  04/16/13 145 lb (65.772 kg)  04/04/13 151 lb 14.4 oz (68.9 kg)  03/22/13 150 lb (68.04 kg)  03/15/13 150 lb (68.04 kg)  12/17/12 166 lb 7.2 oz (75.5 kg)  12/12/12  182 lb 15.7 oz (83 kg)    Usual Body Weight: 154#  % Usual Body Weight: 107%  BMI:  Body mass index is 22.37 kg/(m^2). Meets criteria for normal weight.   Estimated Nutritional Needs: Kcal: 3536-1443 daily Protein: 60-75 grams daily Fluid: 1.6-1.9 L daily  Skin: stage I pressure ulcer on medial sacrum  Diet Order: Cardiac  EDUCATION NEEDS: -Education not appropriate at this time   Intake/Output Summary (Last 24 hours) at 01/08/14 1518 Last data filed at 01/08/14 0800  Gross per 24 hour  Intake    240 ml  Output      0 ml  Net    240 ml    Last BM: 01/08/14  Labs:   Recent Labs Lab 01/08/14 0013 01/08/14 0536  NA 141 141  K 5.0 5.3  CL 101 105  CO2 22 22  BUN 61* 62*  CREATININE 3.10* 3.19*  CALCIUM 8.4 8.4  GLUCOSE 120* 121*    CBG (last 3)  No results found for this basename: GLUCAP,  in the last 72 hours  Scheduled Meds: . amLODipine  10 mg Oral Daily  . aspirin  81 mg Oral Daily  . carvedilol  3.125 mg Oral BID WC  . [START ON 01/09/2014] cefTRIAXone (ROCEPHIN)  IV  1 g Intravenous Q24H  . darifenacin  7.5 mg Oral Daily  . dextromethorphan-guaiFENesin  1 tablet Oral Q12H  . divalproex  125 mg  Oral BID  . docusate sodium  200 mg Oral BID  . feeding supplement (ENSURE COMPLETE)  237 mL Oral BID BM  . feeding supplement (PRO-STAT SUGAR FREE 64)  30 mL Oral TID WC  . latanoprost  1 drop Both Eyes QHS  . levothyroxine  137 mcg Oral QAC breakfast  . LORazepam  0.5 mg Oral BID  . polyethylene glycol  17 g Oral Daily  . sodium chloride  3 mL Intravenous Q12H  . sodium chloride  3 mL Intravenous Q12H  . terazosin  1 mg Oral QHS    Continuous Infusions:   Past Medical History  Diagnosis Date  . Hypertension   . Arthritis   . Stroke   . Malaria   . Anemia   . Hyperlipidemia   . Urothelial cancer 12/16/2012    Past Surgical History  Procedure Laterality Date  . Hernia repair    . Cataract extraction    . Appendectomy    . Eye surgery    .  Cystoscopy  12/08/2012    Procedure: CYSTOSCOPY FLEXIBLE;  Surgeon: Marissa Nestle, MD;  Location: [facility];  Service: Urology;  Laterality: N/A;  . Transurethral resection of bladder tumor  12/09/2012    Procedure: TRANSURETHRAL RESECTION OF BLADDER TUMOR (TURBT);  Surgeon: Marissa Nestle, MD;  Location: [facility];  Service: Urology;  Laterality: N/A;    [place] A. Jimmye Norman, RD, LDN Pager: [phone]

## 2014-01-08 NOTE — ED Provider Notes (Signed)
CSN: [id number]     Arrival date & time 01/08/14  0000 History   First MD Initiated Contact with Patient 01/08/14 0006     Chief Complaint  Patient presents with  . Shortness of Breath     (Consider location/radiation/quality/duration/timing/severity/associated sxs/prior Treatment) HPI Comments: Patient is 80 year old male from local nursing home who presents with "I think I have a cold" though the nursing home reports that the patient has been short of breath for 2 days.  He has a PMHx significant for HTN, CVA, Anemia, ARF, Bladder cancer and CHF.  He reports cough but is unable to tell me about sputum production.  He denies fever, chills and there is no record of this in the nursing home report.  He denies chest pain, abdominal pain but does complain of some mild back pain.  He also did not complain of nausea or vomiting but vomited upon arrival to the ED.  A level 5 caveat exists because the patient is a poor historian and there is limited information from the nursing home.  Patient is a 80 y.o. male presenting with shortness of breath. The history is provided by the patient, the nursing home and the EMS personnel. The history is limited by the absence of a caregiver. No language interpreter was used.  Shortness of Breath   Past Medical History  Diagnosis Date  . Hypertension   . Arthritis   . Stroke   . Malaria   . Anemia   . Hyperlipidemia   . Urothelial cancer 12/16/2012   Past Surgical History  Procedure Laterality Date  . Hernia repair    . Cataract extraction    . Appendectomy    . Eye surgery    . Cystoscopy  12/08/2012    Procedure: CYSTOSCOPY FLEXIBLE;  Surgeon: Marissa Nestle, MD;  Location: [facility];  Service: Urology;  Laterality: N/A;  . Transurethral resection of bladder tumor  12/09/2012    Procedure: TRANSURETHRAL RESECTION OF BLADDER TUMOR (TURBT);  Surgeon: Marissa Nestle, MD;  Location: [facility];  Service: Urology;  Laterality: N/A;   Family History  Problem  Relation Age of Onset  . Diabetes Mother   . Diabetes Father   . Cancer Sister     breast  . Cancer Sister     thyroid  . Cancer Brother     leukemia  . Cancer Brother   . Coronary artery disease Brother    History  Substance Use Topics  . Smoking status: Former Research scientist (life sciences)  . Smokeless tobacco: Former Systems developer  . Alcohol Use: No    Review of Systems  Unable to perform ROS: Age  Respiratory: Positive for shortness of breath.       Allergies  Shellfish allergy  Home Medications   Current Outpatient Rx  Name  Route  Sig  Dispense  Refill  . acetaminophen (TYLENOL) 500 MG tablet   Oral   Take 500 mg by mouth every 6 (six) hours as needed. pain         . albuterol (PROAIR HFA) 108 (90 BASE) MCG/ACT inhaler   Inhalation   Inhale 2 puffs into the lungs 4 (four) times daily as needed for wheezing or shortness of breath. dyspnea         . amLODipine (NORVASC) 10 MG tablet   Oral   Take 1 tablet (10 mg total) by mouth daily.   30 tablet   0   . aspirin 81 MG chewable tablet  Oral   Chew 81 mg by mouth daily.         . carvedilol (COREG) 3.125 MG tablet   Oral   Take 1 tablet (3.125 mg total) by mouth 2 (two) times daily with a meal.   30 tablet   0   . dextromethorphan-guaiFENesin (MUCINEX DM) 30-600 MG per 12 hr tablet   Oral   Take 1 tablet by mouth every 12 (twelve) hours.         . divalproex (DEPAKOTE SPRINKLE) 125 MG capsule   Oral   Take 125 mg by mouth 2 (two) times daily.         Marland Kitchen docusate sodium (COLACE) 100 MG capsule   Oral   Take 200 mg by mouth 2 (two) times daily.         . feeding supplement (ENSURE COMPLETE) LIQD   Oral   Take 237 mLs by mouth 2 (two) times daily between meals.   30 Bottle   0   . feeding supplement (PRO-STAT SUGAR FREE 64) LIQD   Oral   Take 30 mLs by mouth 3 (three) times daily with meals.   900 mL   0   . iron polysaccharides (POLY-IRON 150) 150 MG capsule   Oral   Take 150 mg by mouth daily.          Marland Kitchen latanoprost (XALATAN) 0.005 % ophthalmic solution   Both Eyes   Place 1 drop into both eyes at bedtime.         Marland Kitchen levothyroxine (SYNTHROID, LEVOTHROID) 137 MCG tablet   Oral   Take 137 mcg by mouth daily.         Marland Kitchen LORazepam (ATIVAN) 0.5 MG tablet   Oral   Take 0.5 mg by mouth 2 (two) times daily.         . mupirocin ointment (BACTROBAN) 2 %   Nasal   Place 1 application into the nose 2 (two) times daily.   22 g   0   . oxyCODONE (OXY IR/ROXICODONE) 5 MG immediate release tablet   Oral   Take 1 tablet (5 mg total) by mouth every 6 (six) hours as needed for moderate pain.   30 tablet   0   . polyethylene glycol (MIRALAX / GLYCOLAX) packet   Oral   Take 17 g by mouth daily.         . solifenacin (VESICARE) 5 MG tablet   Oral   Take 5 mg by mouth daily.         Marland Kitchen terazosin (HYTRIN) 1 MG capsule   Oral   Take 1 mg by mouth at bedtime.         . torsemide (DEMADEX) 20 MG tablet   Oral   Take 2.5 tablets (50 mg total) by mouth daily.   90 tablet   0   . zinc oxide 20 % ointment   Topical   Apply 1 application topically 2 (two) times daily.           Physical Exam  Nursing note and vitals reviewed. Constitutional: He appears well-developed and well-nourished. No distress.  HENT:  Head: Normocephalic and atraumatic.  Mouth/Throat: No oropharyngeal exudate.  Bilateral cerumen impaction, dry oral mucosa  Eyes: Conjunctivae are normal. No scleral icterus.  Neck: Normal range of motion. Neck supple. No JVD present.  Cardiovascular: Normal rate, regular rhythm and normal heart sounds.  Exam reveals no gallop and no friction rub.   No murmur heard.  Pulmonary/Chest: Effort normal. No respiratory distress. He has no wheezes. He has rales. He exhibits no tenderness.  Bilateral rales in RLL and LLL  Abdominal: Soft. Bowel sounds are normal. He exhibits no distension. There is no tenderness. There is no rebound and no guarding.  Genitourinary:  Indwelling  foley catheter  Musculoskeletal: Normal range of motion. He exhibits no edema and no tenderness.  Lymphadenopathy:    He has no cervical adenopathy.  Neurological: He is alert. He exhibits normal muscle tone. Coordination normal.  Skin: Skin is warm and dry. No rash noted. No erythema. There is pallor.  Psychiatric: He has a normal mood and affect. His behavior is normal. Judgment and thought content normal.    ED Course  Procedures (including critical care time) Labs Review Labs Reviewed  CBC WITH DIFFERENTIAL  BASIC METABOLIC PANEL  PRO B NATRIURETIC PEPTIDE   Imaging Review No results found.  EKG Interpretation   None       MDM   Care of patient turned over to Dr. Roxanne Mins for disposition and diagnosis.    Idalia Needle Joelyn Oms, PA-C 01/08/14 0105

## 2014-01-08 NOTE — Clinical Social Work Note (Signed)
Per [name], Scientist, physiological at [facility], okay for return if stable over weekend.   Reginald Waller, [place]

## 2014-01-08 NOTE — Progress Notes (Signed)
01/08/14 1159 Dr. Michela Pitcher added to patient treatment team per his request due to new consult for urology. Janeece Fitting, RN

## 2014-01-08 NOTE — Progress Notes (Signed)
Utilization review Completed Natale Barba RN BSN   

## 2014-01-08 NOTE — Consult Note (Signed)
Consult note [phone]

## 2014-01-08 NOTE — Progress Notes (Signed)
[facility]                                                                              Patient Demographics  Reginald Waller, is a 80 y.o. male, DOB - February 06, 1919, JIR:[id number]  Admit date - 01/08/2014   Admitting Physician Reginald Hillock, MD  Outpatient Primary MD for the patient is Reginald Grills, MD  LOS - 0   Chief Complaint  Patient presents with  . Shortness of Breath        Assessment & Plan  Dyspnea -Unknown source -Will continue his home oxygen to maintain his sats above 92%. -Chest x-ray showed retrocardiac density compatible with effusion and atelectasis. Pneumonia is difficult to exclude but not favored.  -Will obtain noncontrast CT chest to evaluate further  UTI -Patient does have an indwelling Foley catheter. -Will continue ceftriaxone -Pending urine culture  Hematuria -Patient does have a history of bladder cancer, follows up with Dr. Michela Waller -Will consult urology -Patient has indwelling Foley catheter  Acute on chronic kidney disease -Creatinine currently 3.7 -Will hold diuretics and nephrotoxic agents -Will continue to monitor  History of CHF, diastolic -Currently not in exacerbation -BNP is chronically elevated -Diuretics however this time due to worsening renal function. -Last echocardiogram September 2014 shows an EF of 75-10%, grade 1 diastolic dysfunction -Will continue daily weights, monitoring his input and output, fluid restriction  Code Status: DO NOT RESUSCITATE  Family Communication: None at bedside  Disposition Plan: Admitted  Time Spent in minutes   30 minutes  Procedures none  Consults  Urology  DVT Prophylaxis SCDs  Lab Results  Component Value Date   PLT 295 01/08/2014    Medications  Scheduled Meds: . amLODipine  10 mg Oral Daily  . aspirin  81 mg Oral Daily  . carvedilol  3.125 mg Oral BID WC  . [START ON 01/09/2014] cefTRIAXone (ROCEPHIN)  IV  1 g Intravenous Q24H  . darifenacin  7.5 mg Oral Daily  .  dextromethorphan-guaiFENesin  1 tablet Oral Q12H  . divalproex  125 mg Oral BID  . docusate sodium  200 mg Oral BID  . feeding supplement (ENSURE COMPLETE)  237 mL Oral BID BM  . feeding supplement (PRO-STAT SUGAR FREE 64)  30 mL Oral TID WC  . latanoprost  1 drop Both Eyes QHS  . levothyroxine  137 mcg Oral QAC breakfast  . LORazepam  0.5 mg Oral BID  . polyethylene glycol  17 g Oral Daily  . sodium chloride  3 mL Intravenous Q12H  . sodium chloride  3 mL Intravenous Q12H  . terazosin  1 mg Oral QHS   Continuous Infusions:  PRN Meds:.sodium chloride, acetaminophen, acetaminophen, acetaminophen, oxyCODONE, sodium chloride  Antibiotics    Anti-infectives   Start     Dose/Rate Route Frequency Ordered Stop   01/09/14 0000  cefTRIAXone (ROCEPHIN) 1 g in dextrose 5 % 50 mL IVPB     1 g 100 mL/hr over 30 Minutes Intravenous Every 24 hours 01/08/14 0533     01/08/14 0245  cefTRIAXone (ROCEPHIN) 1 g in dextrose 5 % 50 mL IVPB     1 g 100 mL/hr over 30 Minutes Intravenous  Once 01/08/14 0231 01/08/14 4128        Subjective:   Reginald Waller seen and examined today.  Patient continues to complain of shortness of breath. Denies any cough. Has no other complaints.   Objective:   Filed Vitals:   01/08/14 0046 01/08/14 0342 01/08/14 0535  BP: 156/50 165/92 140/54  Pulse: 86 83   Temp: 100.3 F (37.9 C)  98 F (36.7 C)  TempSrc: Rectal  Oral  Resp: 18 20 20   Height: 6' (1.829 m)    Weight: 74.844 kg (165 lb)    SpO2: 94% 96% 98%    Wt Readings from Last 3 Encounters:  01/08/14 74.844 kg (165 lb)  11/30/13 67.7 kg (149 lb 4 oz)  08/06/13 66.5 kg (146 lb 9.7 oz)     Intake/Output Summary (Last 24 hours) at 01/08/14 1030 Last data filed at 01/08/14 0800  Gross per 24 hour  Intake    240 ml  Output      0 ml  Net    240 ml    Exam  General: Well developed, well nourished, NAD, appears stated age  HEENT: NCAT, PERRLA, EOMI, Anicteic Sclera, mucous membranes moist.    Neck: Supple, no JVD, no masses  Cardiovascular: S1 S2 auscultated, Regular rate and rhythm.  Respiratory: Clear to auscultation bilaterally with equal chest rise  Abdomen: Soft, nontender, nondistended, + bowel sounds  Extremities: warm dry without cyanosis clubbing or edema  Neuro: AAOx3, cranial nerves grossly intact.   Skin: Without rashes exudates or nodules  Psych: Normal affect and demeanor with intact judgement and insight  Data Review   Micro Results No results found for this or any previous visit (from the past 240 hour(s)).  Radiology Reports Dg Chest 2 View  01/08/2014   CLINICAL DATA:  Congestion.  Short of breath.  EXAM: CHEST  2 VIEW  COMPARISON:  DG CHEST 1 VIEW dated 08/04/2013; DG CHEST 1V PORT dated 08/03/2013; DG CHEST 1 VIEW dated 07/31/2013; DG CHEST 1V PORT dated 12/15/2012; DG CHEST 2 VIEW dated 12/04/2012  FINDINGS: Cardiomegaly. Basilar atelectasis. Retrocardiac density is present likely representing atelectasis and effusion and combination. The effusion is best visualized on the lateral view. Aortic arch atherosclerosis.  When compared to the most recent prior examinations, pulmonary aeration has improved although the retrocardiac density has persisted. It is difficult to completely exclude infection in the retrocardiac region however not favored.  IMPRESSION: 1. Cardiomegaly. 2. Retrocardiac density compatible with effusion and atelectasis. Pneumonia is difficult to exclude but not favored. No pulmonary edema.   Electronically Signed   By: Reginald Waller M.D.   On: 01/08/2014 01:34    CBC  Recent Labs Lab 01/08/14 0013 01/08/14 0536  WBC 19.2* 25.9*  HGB 9.3* 9.0*  HCT 27.5* 27.1*  PLT 319 295  MCV 96.2 96.8  MCH 32.5 32.1  MCHC 33.8 33.2  RDW 15.7* 16.0*  LYMPHSABS 0.7  --   MONOABS 1.6*  --   EOSABS 0.3  --   BASOSABS 0.0  --     Chemistries   Recent Labs Lab 01/08/14 0013 01/08/14 0536  NA 141 141  K 5.0 5.3  CL 101 105  CO2 22 22   GLUCOSE 120* 121*  BUN 61* 62*  CREATININE 3.10* 3.19*  CALCIUM 8.4 8.4  AST  --  13  ALT  --  6  ALKPHOS  --  74  BILITOT  --  0.3   ------------------------------------------------------------------------------------------------------------------ estimated creatinine clearance is 15.6  ml/min (by C-G formula based on Cr of 3.19). ------------------------------------------------------------------------------------------------------------------ No results found for this basename: HGBA1C,  in the last 72 hours ------------------------------------------------------------------------------------------------------------------ No results found for this basename: CHOL, HDL, LDLCALC, TRIG, CHOLHDL, LDLDIRECT,  in the last 72 hours ------------------------------------------------------------------------------------------------------------------ No results found for this basename: TSH, T4TOTAL, FREET3, T3FREE, THYROIDAB,  in the last 72 hours ------------------------------------------------------------------------------------------------------------------ No results found for this basename: VITAMINB12, FOLATE, FERRITIN, TIBC, IRON, RETICCTPCT,  in the last 72 hours  Coagulation profile No results found for this basename: INR, PROTIME,  in the last 168 hours  No results found for this basename: DDIMER,  in the last 72 hours  Cardiac Enzymes  Recent Labs Lab 01/08/14 0536  TROPONINI <0.30   ------------------------------------------------------------------------------------------------------------------ No components found with this basename: POCBNP,     Leanora Murin D.O. on 01/08/2014 at 10:30 AM  Between 7am to 7pm - Pager - [phone]  After 7pm go to [email or web address] - password [email or web address]  And look for the night coverage person covering for me after hours  [facility] Office  [phone]

## 2014-01-08 NOTE — Clinical Social Work Psychosocial (Signed)
Clinical Social Work Department BRIEF PSYCHOSOCIAL ASSESSMENT 01/08/2014  Patient:  Reginald Waller, Reginald Waller     Account Number:  [id number]     Admit date:  01/08/2014  Clinical Social Worker:  Norina Buzzard Intern  Date/Time:  01/08/2014 09:08 AM  Referred by:  Physician  Date Referred:  01/08/2014 Referred for  ALF Placement   Other Referral:   Interview type:  Family Other interview type:   Niece- Reginald Waller    PSYCHOSOCIAL DATA Living Status:  FACILITY Admitted from facility:  [place] Level of care:  Assisted Living Primary support name:  Reginald Waller Primary support relationship to patient:  FAMILY Degree of support available:   Supportive    CURRENT CONCERNS Current Concerns  Post-Acute Placement   Other Concerns:    SOCIAL WORK ASSESSMENT / PLAN Spoke with pt's niece, Reginald Waller. Pt not oriented at this time. Reginald Waller reports that she and Reginald Waller are pt's best support system. However, they have not been able to see him as often as the used to. Reginald Waller confirms pt has been a resident of [facility] since April, 2014. She explained that pt requires assistance with almost everything at the facility; bathing, dressing, toileting, and getting in and out of bed. She reports pt is hard of hearing. Reginald Waller confirms she is HCPOA for pt. Per Reginald Waller, plan is for pt to return  to [facility] at d/c.    Spoke with Reginald Waller at facility. She confirms pt has been there for a year and a Waller. She explained pt requires assistance with most ADLs, but is able to feed himself. Per Reginald Waller, pt is somewhat confused lately. She reports niece, Reginald Waller comes to see pt often. Pt is receiving Home Health once a month at [facility]. Reginald Waller is agreeable to pt return when medically stable. CSW will continue to follow.   Assessment/plan status:  Psychosocial Support/Ongoing Assessment of Needs Other assessment/ plan:   Information/referral to community resources:    PATIENT'S/FAMILY'S RESPONSE TO  PLAN OF CARE: Reginald Waller at facility and pt's niece, Reginald Waller agreeable to pt return to [facility] when medically stable.    Delfina Redwood BSW Intern

## 2014-01-09 DIAGNOSIS — D649 Anemia, unspecified: Secondary | ICD-10-CM

## 2014-01-09 DIAGNOSIS — N189 Chronic kidney disease, unspecified: Secondary | ICD-10-CM

## 2014-01-09 DIAGNOSIS — I509 Heart failure, unspecified: Secondary | ICD-10-CM

## 2014-01-09 DIAGNOSIS — N179 Acute kidney failure, unspecified: Secondary | ICD-10-CM

## 2014-01-09 DIAGNOSIS — J90 Pleural effusion, not elsewhere classified: Secondary | ICD-10-CM

## 2014-01-09 DIAGNOSIS — R319 Hematuria, unspecified: Secondary | ICD-10-CM

## 2014-01-09 LAB — BASIC METABOLIC PANEL
BUN: 66 mg/dL — ABNORMAL HIGH (ref 6–23)
CALCIUM: 8.4 mg/dL (ref 8.4–10.5)
CO2: 23 mEq/L (ref 19–32)
CREATININE: 3.54 mg/dL — AB (ref 0.50–1.35)
Chloride: 101 mEq/L (ref 96–112)
GFR, EST AFRICAN AMERICAN: 16 mL/min — AB (ref >90)
GFR, EST NON AFRICAN AMERICAN: 14 mL/min — AB (ref >90)
Glucose, Bld: 90 mg/dL (ref 70–99)
Potassium: 5.1 mEq/L (ref 3.7–5.3)
SODIUM: 140 meq/L (ref 137–147)

## 2014-01-09 LAB — CBC
HCT: 27.2 % — ABNORMAL LOW (ref 39.0–52.0)
Hemoglobin: 9 g/dL — ABNORMAL LOW (ref 13.0–17.0)
MCH: 32.6 pg (ref 26.0–34.0)
MCHC: 33.1 g/dL (ref 30.0–36.0)
MCV: 98.6 fL (ref 78.0–100.0)
Platelets: 280 10*3/uL (ref 150–400)
RBC: 2.76 MIL/uL — AB (ref 4.22–5.81)
RDW: 16.4 % — AB (ref 11.5–15.5)
WBC: 19.2 10*3/uL — ABNORMAL HIGH (ref 4.0–10.5)

## 2014-01-09 MED ORDER — BIOTENE DRY MOUTH MT LIQD
15.0000 mL | Freq: Two times a day (BID) | OROMUCOSAL | Status: DC
  Administered 2014-01-09 – 2014-01-12 (×2): 15 mL via OROMUCOSAL

## 2014-01-09 MED ORDER — FUROSEMIDE 10 MG/ML IJ SOLN
40.0000 mg | Freq: Once | INTRAMUSCULAR | Status: DC
  Filled 2014-01-09: qty 4

## 2014-01-09 MED ORDER — FUROSEMIDE 10 MG/ML IJ SOLN
40.0000 mg | Freq: Once | INTRAMUSCULAR | Status: AC
Start: 2014-01-09 — End: 2014-01-09
  Administered 2014-01-09: 40 mg via INTRAVENOUS
  Filled 2014-01-09: qty 4

## 2014-01-09 MED ORDER — DEXTROSE 5 % IV SOLN
1.0000 g | INTRAVENOUS | Status: DC
  Administered 2014-01-10 – 2014-01-12 (×3): 1 g via INTRAVENOUS
  Filled 2014-01-09 (×4): qty 1

## 2014-01-09 MED ORDER — CEFEPIME HCL 2 G IJ SOLR
2.0000 g | Freq: Once | INTRAMUSCULAR | Status: AC
  Administered 2014-01-09: 2 g via INTRAVENOUS
  Filled 2014-01-09: qty 2

## 2014-01-09 NOTE — Consult Note (Signed)
NAMEDEKLIN, BIELER NO.:  [id number]  MEDICAL RECORD NO.:  [id number]  LOCATION:  U725                          FACILITY:  [facility]  PHYSICIAN:  Marissa Nestle, M.D.DATE OF BIRTH:  26-Aug-1919  DATE OF CONSULTATION: DATE OF DISCHARGE:                                CONSULTATION   CHIEF COMPLAINT:  Gross hematuria.  HISTORY:  A 80 year old gentleman who is well-known to me.  He has unresectable bladder tumor.  Because of his age, it was decided not to give him any more treatment.  He cannot withstand cystectomy, so it was decided not to do anything.  He is a DNR patient.  He came back to the emergency room before with gross hematuria, but he is known to have bladder tumor, and now his urine is grossly clear.  He is not having any complaints.  PAST MEDICAL HISTORY:  Refer to his old record.  REVIEW OF SYSTEMS:  Unremarkable.  PHYSICAL EXAMINATION:  Moderately built male not in acute distress. Fully conscious.  Alert and oriented.  He does have indwelling Foley catheter, which he should keep.  His blood pressure is 140/54, temperature is 98, O2 saturation 98% on room air.  LABORATORY DATA:  Sodium 141, potassium 5.3, chloride 105, CO2 is 22, BUN is 62, creatinine is 3.19.  WBC count is 25.9, hematocrit is 27.1.  He has been started on Rocephin.  IMPRESSION:  Bladder tumor, unresectable.  Recommend continue Rocephin and do urine culture.  He may use an unit of blood.  We will follow on p.r.n. basis.     Marissa Nestle, M.D.     MIJ/MEDQ  D:  01/08/2014  T:  01/09/2014  Job:  [id number]

## 2014-01-09 NOTE — Plan of Care (Signed)
Problem: Phase I Progression Outcomes Goal: Voiding-avoid urinary catheter unless indicated Outcome: Adequate for Discharge Patient has chronic foley catheter in place.

## 2014-01-09 NOTE — Progress Notes (Signed)
ANTIBIOTIC CONSULT NOTE  Pharmacy Consult for Cefepime Indication: UTI  Allergies  Allergen Reactions  . Shellfish Allergy Other (See Comments)    Swelling of throat and tongue.    Patient Measurements: Height: 6' (182.9 cm) Weight: 157 lb 6.4 oz (71.396 kg) IBW/kg (Calculated) : 77.6  Vital Signs: Temp: 99.2 F (37.3 C) (02/14 0615) Temp src: Oral (02/14 0615) BP: 129/58 mmHg (02/14 0615) Pulse Rate: 75 (02/14 0615) Intake/Output from previous day: 02/13 0701 - 02/14 0700 In: 720 [P.O.:720] Out: 925 [Urine:925] Intake/Output from this shift: Total I/O In: 240 [P.O.:240] Out: -   Labs:  Recent Labs  01/08/14 0013 01/08/14 0536 01/09/14 0803  WBC 19.2* 25.9* 19.2*  HGB 9.3* 9.0* 9.0*  PLT 319 295 280  CREATININE 3.10* 3.19* 3.54*   Estimated Creatinine Clearance: 13.4 ml/min (by C-G formula based on Cr of 3.54). No results found for this basename: Letta Median, VANCORANDOM, GENTTROUGH, GENTPEAK, GENTRANDOM, TOBRATROUGH, TOBRAPEAK, TOBRARND, AMIKACINPEAK, AMIKACINTROU, AMIKACIN,  in the last 72 hours   Microbiology: Recent Results (from the past 720 hour(s))  URINE CULTURE     Status: None   Collection Time    01/08/14 12:45 AM      Result Value Ref Range Status   Specimen Description URINE, CATHETERIZED   Final   Special Requests NONE   Final   Culture  Setup Time     Final   Value: 01/08/2014 03:00     Performed at [facility]     Final   Value: >=100,000 COLONIES/ML     Performed at [facility]   Culture     Final   Value: PSEUDOMONAS AERUGINOSA     Performed at [facility]   Report Status PENDING   Incomplete  MRSA PCR SCREENING     Status: Abnormal   Collection Time    01/08/14 10:30 AM      Result Value Ref Range Status   MRSA by PCR POSITIVE (*) NEGATIVE Final   Comment:            The GeneXpert MRSA Assay (FDA     approved for NASAL specimens     only), is one component of a   comprehensive MRSA colonization     surveillance program. It is not     intended to diagnose MRSA     infection nor to guide or     monitor treatment for     MRSA infections.     RESULT CALLED TO, READ BACK BY AND VERIFIED WITH:     WRIGHT,M AT 2774 BY GODFREY,O ON 01/08/14    Medical History: Past Medical History  Diagnosis Date  . Hypertension   . Arthritis   . Stroke   . Malaria   . Anemia   . Hyperlipidemia   . Urothelial cancer 12/16/2012    Medications:  Scheduled:  . amLODipine  10 mg Oral Daily  . aspirin  81 mg Oral Daily  . carvedilol  3.125 mg Oral BID WC  . ceFEPime (MAXIPIME) IV  2 g Intravenous Once  . darifenacin  7.5 mg Oral Daily  . dextromethorphan-guaiFENesin  1 tablet Oral Q12H  . divalproex  125 mg Oral BID  . docusate sodium  200 mg Oral BID  . feeding supplement (ENSURE COMPLETE)  237 mL Oral BID BM  . feeding supplement (PRO-STAT SUGAR FREE 64)  30 mL Oral TID WC  . latanoprost  1 drop Both Eyes QHS  .  levothyroxine  137 mcg Oral QAC breakfast  . LORazepam  0.5 mg Oral BID  . polyethylene glycol  17 g Oral Daily  . sodium chloride  3 mL Intravenous Q12H  . sodium chloride  3 mL Intravenous Q12H  . terazosin  1 mg Oral QHS   Assessment: Okay for Protocol, UTI Psueodomonas, sensitivities pending.  Ceftriaxone 2/13 >> 2/14 Cefepime 2/14 >>  Goal of Therapy:  Eradicate infection.   Plan:  Cefepime 2gm IV x 1 today, then 1000mg  IV every 24 hours. Follow up culture results  Pricilla Larsson 01/09/2014,11:10 AM

## 2014-01-09 NOTE — Progress Notes (Addendum)
[facility]                                                                              Patient Demographics  Reginald Waller, is a 80 y.o. male, DOB - 1919-10-09, OZD:[id number]  Admit date - 01/08/2014   Admitting Physician Oswald Hillock, MD  Outpatient Primary MD for the patient is Leonides Grills, MD  LOS - 1   Chief Complaint  Patient presents with  . Shortness of Breath        Assessment & Plan  Dyspnea possibly secondary to pleural effusions -Will continue his home oxygen to maintain his sats above 92%. -Chest x-ray showed retrocardiac density compatible with effusion and atelectasis. Pneumonia is difficult to exclude but not favored.  -CT chest showed moderate left and small right pleural effusions  -Will give one dose of lasix in spite of his AKI to see if his breathing improves.  UTI possibly secondary to indwelling foley catheter -Urine culture returned >100K colonies for pseudmonas aeruginosa -Will change antibiotic to cefepime and await sensitivities  -Leukocytosis trending downward.  Hematuria -Patient does have a history of bladder cancer with indwelling foley catheter, follows up with Dr. Michela Pitcher -Urology consulted and will follow on a PRN basis  Acute on chronic kidney disease -Improving, Creatinine currently 3.54 -Will give one dose of IV lasix to see if this breathing improves -Will continue to hold other nephrotoxic medications -Will continue to monitor  History of CHF, diastolic -Currently not in exacerbation -BNP is chronically elevated -Diuretics however this time due to worsening renal function. -Last echocardiogram September 2014 shows an EF of 25-95%, grade 1 diastolic dysfunction -Will continue daily weights, monitoring his input and output, fluid restriction  Anemia, nromacytic -likely secondary to chronic renal disease -H/H 9/27.2, baseline appears to be around 9-10 -Will continue to monitor CBC -Patient hemodynamically  stable  Code Status: DO NOT RESUSCITATE  Family Communication: None at bedside  Disposition Plan: Admitted  Time Spent in minutes   25 minutes  Procedures none  Consults  Urology  DVT Prophylaxis SCDs  Lab Results  Component Value Date   PLT 280 01/09/2014    Medications  Scheduled Meds: . amLODipine  10 mg Oral Daily  . aspirin  81 mg Oral Daily  . carvedilol  3.125 mg Oral BID WC  . darifenacin  7.5 mg Oral Daily  . dextromethorphan-guaiFENesin  1 tablet Oral Q12H  . divalproex  125 mg Oral BID  . docusate sodium  200 mg Oral BID  . feeding supplement (ENSURE COMPLETE)  237 mL Oral BID BM  . feeding supplement (PRO-STAT SUGAR FREE 64)  30 mL Oral TID WC  . latanoprost  1 drop Both Eyes QHS  . levothyroxine  137 mcg Oral QAC breakfast  . LORazepam  0.5 mg Oral BID  . polyethylene glycol  17 g Oral Daily  . sodium chloride  3 mL Intravenous Q12H  . sodium chloride  3 mL Intravenous Q12H  . terazosin  1 mg Oral QHS   Continuous Infusions:  PRN Meds:.sodium chloride, acetaminophen, acetaminophen, acetaminophen, oxyCODONE, sodium chloride  Antibiotics    Anti-infectives   Start     Dose/Rate  Route Frequency Ordered Stop   01/09/14 0000  cefTRIAXone (ROCEPHIN) 1 g in dextrose 5 % 50 mL IVPB  Status:  Discontinued     1 g 100 mL/hr over 30 Minutes Intravenous Every 24 hours 01/08/14 0533 01/09/14 1011   01/08/14 0245  cefTRIAXone (ROCEPHIN) 1 g in dextrose 5 % 50 mL IVPB     1 g 100 mL/hr over 30 Minutes Intravenous  Once 01/08/14 0231 01/08/14 1517        Subjective:   Georga Waller seen and examined today.  Patient states he has vision problems and would like aid to help him eat.  He currently has no specific complaints.  States his breathing is improving.   Objective:   Filed Vitals:   01/08/14 1844 01/08/14 2005 01/08/14 2157 01/09/14 0615  BP: 148/60 121/53 121/53 129/58  Pulse:  73  75  Temp:  99.3 F (37.4 C)  99.2 F (37.3 C)  TempSrc:  Oral   Oral  Resp:  20  22  Height:      Weight:      SpO2:  95%  93%    Wt Readings from Last 3 Encounters:  01/08/14 74.844 kg (165 lb)  11/30/13 67.7 kg (149 lb 4 oz)  08/06/13 66.5 kg (146 lb 9.7 oz)     Intake/Output Summary (Last 24 hours) at 01/09/14 1012 Last data filed at 01/09/14 0800  Gross per 24 hour  Intake    720 ml  Output    925 ml  Net   -205 ml    Exam  General: Well developed, well nourished, NAD, appears stated age  HEENT: NCAT, PERRLA, EOMI, Anicteic Sclera, mucous membranes moist.   Neck: Supple, no JVD, no masses  Cardiovascular: S1 S2 auscultated, Regular rate and rhythm.  Respiratory: poor inspirtory effort, no crackles or wheezing noted  Abdomen: Soft, nontender, nondistended, + bowel sounds  Extremities: warm dry without cyanosis clubbing or edema  Neuro: AAOx3, cranial nerves grossly intact.   Skin: Without rashes exudates or nodules  Psych: Normal affect and demeanor with intact judgement and insight  Data Review   Micro Results Recent Results (from the past 240 hour(s))  URINE CULTURE     Status: None   Collection Time    01/08/14 12:45 AM      Result Value Ref Range Status   Specimen Description URINE, CATHETERIZED   Final   Special Requests NONE   Final   Culture  Setup Time     Final   Value: 01/08/2014 03:00     Performed at [place]     Final   Value: >=100,000 COLONIES/ML     Performed at [facility]   Culture     Final   Value: PSEUDOMONAS AERUGINOSA     Performed at [facility]   Report Status PENDING   Incomplete  MRSA PCR SCREENING     Status: Abnormal   Collection Time    01/08/14 10:30 AM      Result Value Ref Range Status   MRSA by PCR POSITIVE (*) NEGATIVE Final   Comment:            The GeneXpert MRSA Assay (FDA     approved for NASAL specimens     only), is one component of a     comprehensive MRSA colonization     surveillance program. It is not     intended  to diagnose MRSA  infection nor to guide or     monitor treatment for     MRSA infections.     RESULT CALLED TO, READ BACK BY AND VERIFIED WITH:     WRIGHT,M AT [id number] BY GODFREY,O ON 01/08/14    Radiology Reports Dg Chest 2 View  01/08/2014   CLINICAL DATA:  Congestion.  Short of breath.  EXAM: CHEST  2 VIEW  COMPARISON:  DG CHEST 1 VIEW dated 08/04/2013; DG CHEST 1V PORT dated 08/03/2013; DG CHEST 1 VIEW dated 07/31/2013; DG CHEST 1V PORT dated 12/15/2012; DG CHEST 2 VIEW dated 12/04/2012  FINDINGS: Cardiomegaly. Basilar atelectasis. Retrocardiac density is present likely representing atelectasis and effusion and combination. The effusion is best visualized on the lateral view. Aortic arch atherosclerosis.  When compared to the most recent prior examinations, pulmonary aeration has improved although the retrocardiac density has persisted. It is difficult to completely exclude infection in the retrocardiac region however not favored.  IMPRESSION: 1. Cardiomegaly. 2. Retrocardiac density compatible with effusion and atelectasis. Pneumonia is difficult to exclude but not favored. No pulmonary edema.   Electronically Signed   By: Dereck Ligas M.D.   On: 01/08/2014 01:34    CBC  Recent Labs Lab 01/08/14 0013 01/08/14 0536 01/09/14 0803  WBC 19.2* 25.9* 19.2*  HGB 9.3* 9.0* 9.0*  HCT 27.5* 27.1* 27.2*  PLT 319 295 280  MCV 96.2 96.8 98.6  MCH 32.5 32.1 32.6  MCHC 33.8 33.2 33.1  RDW 15.7* 16.0* 16.4*  LYMPHSABS 0.7  --   --   MONOABS 1.6*  --   --   EOSABS 0.3  --   --   BASOSABS 0.0  --   --     Chemistries   Recent Labs Lab 01/08/14 0013 01/08/14 0536 01/09/14 0803  NA 141 141 140  K 5.0 5.3 5.1  CL 101 105 101  CO2 22 22 23   GLUCOSE 120* 121* 90  BUN 61* 62* 66*  CREATININE 3.10* 3.19* 3.54*  CALCIUM 8.4 8.4 8.4  AST  --  13  --   ALT  --  6  --   ALKPHOS  --  74  --   BILITOT  --  0.3  --     ------------------------------------------------------------------------------------------------------------------ estimated creatinine clearance is 14.1 ml/min (by C-G formula based on Cr of 3.54). ------------------------------------------------------------------------------------------------------------------ No results found for this basename: HGBA1C,  in the last 72 hours ------------------------------------------------------------------------------------------------------------------ No results found for this basename: CHOL, HDL, LDLCALC, TRIG, CHOLHDL, LDLDIRECT,  in the last 72 hours ------------------------------------------------------------------------------------------------------------------ No results found for this basename: TSH, T4TOTAL, FREET3, T3FREE, THYROIDAB,  in the last 72 hours ------------------------------------------------------------------------------------------------------------------ No results found for this basename: VITAMINB12, FOLATE, FERRITIN, TIBC, IRON, RETICCTPCT,  in the last 72 hours  Coagulation profile No results found for this basename: INR, PROTIME,  in the last 168 hours  No results found for this basename: DDIMER,  in the last 72 hours  Cardiac Enzymes  Recent Labs Lab 01/08/14 0536 01/08/14 1124 01/08/14 1724  TROPONINI <0.30 <0.30 <0.30   ------------------------------------------------------------------------------------------------------------------ No components found with this basename: POCBNP,     Malayasia Mirkin D.O. on 01/09/2014 at 10:12 AM  Between 7am to 7pm - Pager - [phone]  After 7pm go to [email or web address] - password [email or web address]  And look for the night coverage person covering for me after hours  [facility] Office  [phone]

## 2014-01-10 DIAGNOSIS — E440 Moderate protein-calorie malnutrition: Secondary | ICD-10-CM

## 2014-01-10 DIAGNOSIS — I10 Essential (primary) hypertension: Secondary | ICD-10-CM

## 2014-01-10 DIAGNOSIS — J189 Pneumonia, unspecified organism: Secondary | ICD-10-CM

## 2014-01-10 DIAGNOSIS — N183 Chronic kidney disease, stage 3 unspecified: Secondary | ICD-10-CM

## 2014-01-10 DIAGNOSIS — E860 Dehydration: Secondary | ICD-10-CM

## 2014-01-10 DIAGNOSIS — J9819 Other pulmonary collapse: Secondary | ICD-10-CM

## 2014-01-10 LAB — URINE CULTURE

## 2014-01-10 LAB — IRON AND TIBC
Iron: 14 ug/dL — ABNORMAL LOW (ref 42–135)
Saturation Ratios: 8 % — ABNORMAL LOW (ref 20–55)
TIBC: 180 ug/dL — AB (ref 215–435)
UIBC: 166 ug/dL (ref 125–400)

## 2014-01-10 LAB — RETICULOCYTES
RBC.: 2.46 MIL/uL — ABNORMAL LOW (ref 4.22–5.81)
Retic Count, Absolute: 49.2 10*3/uL (ref 19.0–186.0)
Retic Ct Pct: 2 % (ref 0.4–3.1)

## 2014-01-10 LAB — CBC
HCT: 24.1 % — ABNORMAL LOW (ref 39.0–52.0)
Hemoglobin: 7.7 g/dL — ABNORMAL LOW (ref 13.0–17.0)
MCH: 31.3 pg (ref 26.0–34.0)
MCHC: 32 g/dL (ref 30.0–36.0)
MCV: 98 fL (ref 78.0–100.0)
PLATELETS: 276 10*3/uL (ref 150–400)
RBC: 2.46 MIL/uL — AB (ref 4.22–5.81)
RDW: 16.1 % — AB (ref 11.5–15.5)
WBC: 14.6 10*3/uL — AB (ref 4.0–10.5)

## 2014-01-10 LAB — BASIC METABOLIC PANEL
BUN: 79 mg/dL — ABNORMAL HIGH (ref 6–23)
CHLORIDE: 101 meq/L (ref 96–112)
CO2: 23 meq/L (ref 19–32)
Calcium: 8.4 mg/dL (ref 8.4–10.5)
Creatinine, Ser: 3.95 mg/dL — ABNORMAL HIGH (ref 0.50–1.35)
GFR calc Af Amer: 14 mL/min — ABNORMAL LOW (ref >90)
GFR calc non Af Amer: 12 mL/min — ABNORMAL LOW (ref >90)
Glucose, Bld: 91 mg/dL (ref 70–99)
POTASSIUM: 4.7 meq/L (ref 3.7–5.3)
SODIUM: 140 meq/L (ref 137–147)

## 2014-01-10 NOTE — Progress Notes (Signed)
RT called by MD about Chest PT and suctioning. RT set up oral suction and attempted but pt did not cough up anything. When coached pt has a strong cough. RT performed flutter with pt and pt did well with coaching and coughed afterwards. Pt needs lots of coaching and performs well with coaching. RT will continue to monitor.

## 2014-01-10 NOTE — Progress Notes (Signed)
In addition to bladder ca  He has BPH causing urinary retention i have done w/u in the past and family decided to treat with foley catheter.

## 2014-01-10 NOTE — Progress Notes (Signed)
Patient refuses to take some of meds this morning.  Began to take some with an Ensure, but then states he does not want to take anymore.  He states they are too hard to swallow, but is not agreeable to take them crushed in applesauce or pudding.  States he just wants to be left alone right now.  Explained to patient importance of meds and why he needs certain ones, but still refuses to continue with them.  Refuses oral care at this time as well.  MD made aware.  Patient has congested cough, but is non productive.  Appears to get strangled at times with meds and meals, but unsure if it is just d/t cough.  Speech eval consult placed per MD orders.

## 2014-01-10 NOTE — Progress Notes (Addendum)
[facility]                                                                              Patient Demographics  Reginald Waller, is a 80 y.o. male, DOB - 1919-09-06, AYT:[id number]  Admit date - 01/08/2014   Admitting Physician Meredeth Ide, MD  Outpatient Primary MD for the patient is Kirk Ruths, MD  LOS - 2   Chief Complaint  Patient presents with  . Shortness of Breath        Assessment & Plan  Dyspnea possibly secondary to pleural effusions -Will continue his home oxygen to maintain his sats above 92%. -Chest x-ray showed retrocardiac density compatible with effusion and atelectasis. Pneumonia is difficult to exclude but not favored.  -CT chest showed moderate left and small right pleural effusions  -One dose of lasix given in spite of his AKI to see if his breathing improves. (2/14) -Will ask respiratory to deep suction.  UTI possibly secondary to indwelling foley catheter -Urine culture returned >100K colonies for pseudmonas aeruginosa -Will change antibiotic to cefepime and await sensitivities  -Leukocytosis trending downward.  Hematuria -Patient does have a history of bladder cancer with indwelling foley catheter, follows up with Dr. Jerre Simon -Urology consulted and will follow on a PRN basis -Foley bag- urine yellow in color, clearing  Acute on chronic kidney disease -Creatinine currently 3.95, increase likely due to one dose of lasix given -Will continue to hold other nephrotoxic medications -Will continue to monitor  History of CHF, diastolic -Currently not in exacerbation -BNP is chronically elevated -Diuretics however this time due to worsening renal function. -Last echocardiogram September 2014 shows an EF of 60-65%, grade 1 diastolic dysfunction -Will continue daily weights, monitoring his input and output, fluid restriction  Anemia, nromacytic -likely secondary to chronic renal disease -H/H 7.7/24.1, baseline appears to be around  9-10 -Will continue to monitor CBC -Will obtain FOBT and anemia panel -Patient hemodynamically stable  Code Status: DO NOT RESUSCITATE  Family Communication: None at bedside  Disposition Plan: Admitted  Time Spent in minutes   25 minutes  Procedures none  Consults  Urology  DVT Prophylaxis SCDs  Lab Results  Component Value Date   PLT 276 01/10/2014    Medications  Scheduled Meds: . amLODipine  10 mg Oral Daily  . antiseptic oral rinse  15 mL Mouth Rinse BID  . aspirin  81 mg Oral Daily  . carvedilol  3.125 mg Oral BID WC  . ceFEPime (MAXIPIME) IV  1 g Intravenous Q24H  . darifenacin  7.5 mg Oral Daily  . dextromethorphan-guaiFENesin  1 tablet Oral Q12H  . divalproex  125 mg Oral BID  . docusate sodium  200 mg Oral BID  . feeding supplement (ENSURE COMPLETE)  237 mL Oral BID BM  . feeding supplement (PRO-STAT SUGAR FREE 64)  30 mL Oral TID WC  . furosemide  40 mg Intravenous Once  . latanoprost  1 drop Both Eyes QHS  . levothyroxine  137 mcg Oral QAC breakfast  . LORazepam  0.5 mg Oral BID  . polyethylene glycol  17 g Oral Daily  . sodium chloride  3 mL Intravenous Q12H  .  sodium chloride  3 mL Intravenous Q12H  . terazosin  1 mg Oral QHS   Continuous Infusions:  PRN Meds:.sodium chloride, acetaminophen, acetaminophen, acetaminophen, oxyCODONE, sodium chloride  Antibiotics    Anti-infectives   Start     Dose/Rate Route Frequency Ordered Stop   01/10/14 1000  ceFEPIme (MAXIPIME) 1 g in dextrose 5 % 50 mL IVPB     1 g 100 mL/hr over 30 Minutes Intravenous Every 24 hours 01/09/14 1112     01/09/14 1100  ceFEPIme (MAXIPIME) 2 g in dextrose 5 % 50 mL IVPB     2 g 100 mL/hr over 30 Minutes Intravenous  Once 01/09/14 1024 01/09/14 1120   01/09/14 0000  cefTRIAXone (ROCEPHIN) 1 g in dextrose 5 % 50 mL IVPB  Status:  Discontinued     1 g 100 mL/hr over 30 Minutes Intravenous Every 24 hours 01/08/14 0533 01/09/14 1011   01/08/14 0245  cefTRIAXone (ROCEPHIN) 1 g in  dextrose 5 % 50 mL IVPB     1 g 100 mL/hr over 30 Minutes Intravenous  Once 01/08/14 0231 01/08/14 1478        Subjective:   [name] seen and examined today.  Patient had no complaints today.  Continues to cough.  Objective:   Filed Vitals:   01/09/14 1545 01/09/14 2044 01/10/14 0453 01/10/14 0528  BP: 124/62 123/39  119/43  Pulse: 74 71  72  Temp: 99 F (37.2 C) 99.9 F (37.7 C)  99.5 F (37.5 C)  TempSrc: Oral Oral  Oral  Resp: 22 20  20   Height:      Weight:   71.68 kg (158 lb 0.4 oz)   SpO2: 94% 98%  93%    Wt Readings from Last 3 Encounters:  01/10/14 71.68 kg (158 lb 0.4 oz)  11/30/13 67.7 kg (149 lb 4 oz)  08/06/13 66.5 kg (146 lb 9.7 oz)     Intake/Output Summary (Last 24 hours) at 01/10/14 0848 Last data filed at 01/10/14 0545  Gross per 24 hour  Intake    470 ml  Output   1050 ml  Net   -580 ml    Exam  General: Well developed, well nourished, NAD, appears stated age  HEENT: NCAT, mucous membranes moist.   Neck: Supple, no JVD, no masses  Cardiovascular: S1 S2 auscultated, Regular rate and rhythm.  Respiratory: poor inspirtory effort, no crackles or wheezing noted, upper airway congetion  Abdomen: Soft, nontender, nondistended, + bowel sounds  Extremities: warm dry without cyanosis clubbing or edema  Neuro: AAOx3, no focal deficits  Skin: Without rashes exudates or nodules  Psych: Normal affect and demeanor with intact judgement and insight  Data Review   Micro Results Recent Results (from the past 240 hour(s))  URINE CULTURE     Status: None   Collection Time    01/08/14 12:45 AM      Result Value Ref Range Status   Specimen Description URINE, CATHETERIZED   Final   Special Requests NONE   Final   Culture  Setup Time     Final   Value: 01/08/2014 03:00     Performed at [place]     Final   Value: >=100,000 COLONIES/ML     Performed at [facility]   Culture     Final   Value:  PSEUDOMONAS AERUGINOSA     Performed at [facility]   Report Status PENDING   Incomplete  MRSA PCR SCREENING     Status: Abnormal   Collection Time    01/08/14 10:30 AM      Result Value Ref Range Status   MRSA by PCR POSITIVE (*) NEGATIVE Final   Comment:            The GeneXpert MRSA Assay (FDA     approved for NASAL specimens     only), is one component of a     comprehensive MRSA colonization     surveillance program. It is not     intended to diagnose MRSA     infection nor to guide or     monitor treatment for     MRSA infections.     RESULT CALLED TO, READ BACK BY AND VERIFIED WITH:     WRIGHT,M AT [id number] BY GODFREY,O ON 01/08/14    Radiology Reports Dg Chest 2 View  01/08/2014   CLINICAL DATA:  Congestion.  Short of breath.  EXAM: CHEST  2 VIEW  COMPARISON:  DG CHEST 1 VIEW dated 08/04/2013; DG CHEST 1V PORT dated 08/03/2013; DG CHEST 1 VIEW dated 07/31/2013; DG CHEST 1V PORT dated 12/15/2012; DG CHEST 2 VIEW dated 12/04/2012  FINDINGS: Cardiomegaly. Basilar atelectasis. Retrocardiac density is present likely representing atelectasis and effusion and combination. The effusion is best visualized on the lateral view. Aortic arch atherosclerosis.  When compared to the most recent prior examinations, pulmonary aeration has improved although the retrocardiac density has persisted. It is difficult to completely exclude infection in the retrocardiac region however not favored.  IMPRESSION: 1. Cardiomegaly. 2. Retrocardiac density compatible with effusion and atelectasis. Pneumonia is difficult to exclude but not favored. No pulmonary edema.   Electronically Signed   By: Dereck Ligas M.D.   On: 01/08/2014 01:34    CBC  Recent Labs Lab 01/08/14 0013 01/08/14 0536 01/09/14 0803 01/10/14 0546  WBC 19.2* 25.9* 19.2* 14.6*  HGB 9.3* 9.0* 9.0* 7.7*  HCT 27.5* 27.1* 27.2* 24.1*  PLT 319 295 280 276  MCV 96.2 96.8 98.6 98.0  MCH 32.5 32.1 32.6 31.3  MCHC 33.8 33.2 33.1 32.0  RDW  15.7* 16.0* 16.4* 16.1*  LYMPHSABS 0.7  --   --   --   MONOABS 1.6*  --   --   --   EOSABS 0.3  --   --   --   BASOSABS 0.0  --   --   --     Chemistries   Recent Labs Lab 01/08/14 0013 01/08/14 0536 01/09/14 0803 01/10/14 0546  NA 141 141 140 140  K 5.0 5.3 5.1 4.7  CL 101 105 101 101  CO2 22 22 23 23   GLUCOSE 120* 121* 90 91  BUN 61* 62* 66* 79*  CREATININE 3.10* 3.19* 3.54* 3.95*  CALCIUM 8.4 8.4 8.4 8.4  AST  --  13  --   --   ALT  --  6  --   --   ALKPHOS  --  74  --   --   BILITOT  --  0.3  --   --    ------------------------------------------------------------------------------------------------------------------ estimated creatinine clearance is 12.1 ml/min (by C-G formula based on Cr of 3.95). ------------------------------------------------------------------------------------------------------------------ No results found for this basename: HGBA1C,  in the last 72 hours ------------------------------------------------------------------------------------------------------------------ No results found for this basename: CHOL, HDL, LDLCALC, TRIG, CHOLHDL, LDLDIRECT,  in the last 72 hours ------------------------------------------------------------------------------------------------------------------ No results found for this basename: TSH, T4TOTAL, FREET3, T3FREE, THYROIDAB,  in the last 72 hours ------------------------------------------------------------------------------------------------------------------  No results found for this basename: VITAMINB12, FOLATE, FERRITIN, TIBC, IRON, RETICCTPCT,  in the last 72 hours  Coagulation profile No results found for this basename: INR, PROTIME,  in the last 168 hours  No results found for this basename: DDIMER,  in the last 72 hours  Cardiac Enzymes  Recent Labs Lab 01/08/14 0536 01/08/14 1124 01/08/14 1724  TROPONINI <0.30 <0.30 <0.30    ------------------------------------------------------------------------------------------------------------------ No components found with this basename: POCBNP,     Cahlil Sattar D.O. on 01/10/2014 at 8:48 AM  Between 7am to 7pm - Pager - [phone]  After 7pm go to [email or web address] - password [email or web address]  And look for the night coverage person covering for me after hours  [facility] Office  [phone]

## 2014-01-11 DIAGNOSIS — C679 Malignant neoplasm of bladder, unspecified: Secondary | ICD-10-CM

## 2014-01-11 DIAGNOSIS — N179 Acute kidney failure, unspecified: Secondary | ICD-10-CM

## 2014-01-11 DIAGNOSIS — D649 Anemia, unspecified: Secondary | ICD-10-CM

## 2014-01-11 DIAGNOSIS — N390 Urinary tract infection, site not specified: Secondary | ICD-10-CM

## 2014-01-11 DIAGNOSIS — J90 Pleural effusion, not elsewhere classified: Secondary | ICD-10-CM

## 2014-01-11 LAB — BASIC METABOLIC PANEL
BUN: 92 mg/dL — ABNORMAL HIGH (ref 6–23)
CALCIUM: 8.1 mg/dL — AB (ref 8.4–10.5)
CHLORIDE: 104 meq/L (ref 96–112)
CO2: 21 mEq/L (ref 19–32)
Creatinine, Ser: 4.07 mg/dL — ABNORMAL HIGH (ref 0.50–1.35)
GFR calc non Af Amer: 12 mL/min — ABNORMAL LOW (ref >90)
GFR, EST AFRICAN AMERICAN: 13 mL/min — AB (ref >90)
Glucose, Bld: 91 mg/dL (ref 70–99)
Potassium: 4.7 mEq/L (ref 3.7–5.3)
Sodium: 141 mEq/L (ref 137–147)

## 2014-01-11 LAB — CBC
HCT: 24 % — ABNORMAL LOW (ref 39.0–52.0)
Hemoglobin: 7.8 g/dL — ABNORMAL LOW (ref 13.0–17.0)
MCH: 32 pg (ref 26.0–34.0)
MCHC: 32.5 g/dL (ref 30.0–36.0)
MCV: 98.4 fL (ref 78.0–100.0)
PLATELETS: 276 10*3/uL (ref 150–400)
RBC: 2.44 MIL/uL — ABNORMAL LOW (ref 4.22–5.81)
RDW: 15.8 % — AB (ref 11.5–15.5)
WBC: 10.1 10*3/uL (ref 4.0–10.5)

## 2014-01-11 LAB — FERRITIN: Ferritin: 75 ng/mL (ref 22–322)

## 2014-01-11 LAB — VITAMIN B12: VITAMIN B 12: 320 pg/mL (ref 211–911)

## 2014-01-11 LAB — FOLATE: FOLATE: 13.1 ng/mL

## 2014-01-11 MED ORDER — CHLORHEXIDINE GLUCONATE CLOTH 2 % EX PADS
6.0000 | MEDICATED_PAD | Freq: Every day | CUTANEOUS | Status: DC
  Administered 2014-01-11 – 2014-01-12 (×2): 6 via TOPICAL

## 2014-01-11 MED ORDER — DIPHENHYDRAMINE HCL 25 MG PO CAPS: 25.0000 mg | ORAL_CAPSULE | Freq: Four times a day (QID) | ORAL | Status: DC | PRN

## 2014-01-11 MED ORDER — SODIUM CHLORIDE 0.9 % IV SOLN
INTRAVENOUS | Status: AC
Start: 2014-01-11 — End: 2014-01-12
  Administered 2014-01-11 (×2): via INTRAVENOUS

## 2014-01-11 MED ORDER — MUPIROCIN 2 % EX OINT
TOPICAL_OINTMENT | Freq: Two times a day (BID) | CUTANEOUS | Status: DC
  Administered 2014-01-11 – 2014-01-12 (×2): via NASAL
  Filled 2014-01-11: qty 22

## 2014-01-11 NOTE — Progress Notes (Signed)
ANTIBIOTIC CONSULT NOTE  Pharmacy Consult for Cefepime Indication: UTI  Allergies  Allergen Reactions  . Shellfish Allergy Other (See Comments)    Swelling of throat and tongue.   Patient Measurements: Height: 6' (182.9 cm) Weight: 156 lb 15.5 oz (71.2 kg) IBW/kg (Calculated) : 77.6  Vital Signs: Temp: 98.1 F (36.7 C) (02/16 0444) Temp src: Axillary (02/16 0444) BP: 132/54 mmHg (02/16 0921) Pulse Rate: 66 (02/16 0444) Intake/Output from previous day: 02/15 0701 - 02/16 0700 In: 581 [P.O.:421; I.V.:140; IV Piggyback:20] Out: 725 [Urine:725] Intake/Output from this shift:    Labs:  Recent Labs  01/09/14 0803 01/10/14 0546 01/11/14 0452  WBC 19.2* 14.6* 10.1  HGB 9.0* 7.7* 7.8*  PLT 280 276 276  CREATININE 3.54* 3.95* 4.07*   Estimated Creatinine Clearance: 11.7 ml/min (by C-G formula based on Cr of 4.07). No results found for this basename: Letta Median, VANCORANDOM, GENTTROUGH, GENTPEAK, GENTRANDOM, TOBRATROUGH, TOBRAPEAK, TOBRARND, AMIKACINPEAK, AMIKACINTROU, AMIKACIN,  in the last 72 hours   Microbiology: Recent Results (from the past 720 hour(s))  URINE CULTURE     Status: None   Collection Time    01/08/14 12:45 AM      Result Value Ref Range Status   Specimen Description URINE, CATHETERIZED   Final   Special Requests NONE   Final   Culture  Setup Time     Final   Value: 01/08/2014 03:00     Performed at [facility]     Final   Value: >=100,000 COLONIES/ML     Performed at [facility]   Culture     Final   Value: PSEUDOMONAS AERUGINOSA     Performed at [facility]   Report Status 01/10/2014 FINAL   Final   Organism ID, Bacteria PSEUDOMONAS AERUGINOSA   Final  MRSA PCR SCREENING     Status: Abnormal   Collection Time    01/08/14 10:30 AM      Result Value Ref Range Status   MRSA by PCR POSITIVE (*) NEGATIVE Final   Comment:            The GeneXpert MRSA Assay (FDA     approved for NASAL  specimens     only), is one component of a     comprehensive MRSA colonization     surveillance program. It is not     intended to diagnose MRSA     infection nor to guide or     monitor treatment for     MRSA infections.     RESULT CALLED TO, READ BACK BY AND VERIFIED WITH:     WRIGHT,M AT 0737 BY GODFREY,O ON 01/08/14   Medical History: Past Medical History  Diagnosis Date  . Hypertension   . Arthritis   . Stroke   . Malaria   . Anemia   . Hyperlipidemia   . Urothelial cancer 12/16/2012   Medications:  Scheduled:  . amLODipine  10 mg Oral Daily  . antiseptic oral rinse  15 mL Mouth Rinse BID  . aspirin  81 mg Oral Daily  . carvedilol  3.125 mg Oral BID WC  . ceFEPime (MAXIPIME) IV  1 g Intravenous Q24H  . Chlorhexidine Gluconate Cloth  6 each Topical Q0600  . darifenacin  7.5 mg Oral Daily  . dextromethorphan-guaiFENesin  1 tablet Oral Q12H  . divalproex  125 mg Oral BID  . docusate sodium  200 mg Oral BID  . feeding supplement (ENSURE COMPLETE)  237 mL Oral BID BM  . feeding supplement (PRO-STAT SUGAR FREE 64)  30 mL Oral TID WC  . furosemide  40 mg Intravenous Once  . latanoprost  1 drop Both Eyes QHS  . levothyroxine  137 mcg Oral QAC breakfast  . LORazepam  0.5 mg Oral BID  . mupirocin ointment   Nasal BID  . polyethylene glycol  17 g Oral Daily  . sodium chloride  3 mL Intravenous Q12H  . sodium chloride  3 mL Intravenous Q12H  . terazosin  1 mg Oral QHS   Assessment: Okay for Protocol, UTI Psueodomonas.  SCr continues to rise.  >=100,000 COLONIES/ML Performed at [facility] -        Result:        PSEUDOMONAS AERUGINOSA Performed at [facility]      Report Status 01/10/2014 FINAL        Organism ID, Bacteria PSEUDOMONAS AERUGINOSA      Culture & Susceptibility    PSEUDOMONAS AERUGINOSA    Antibiotic Sensitivity Microscan Status    CEFEPIME Sensitive 2 SENSITIVE Final    Method: MIC    CEFTAZIDIME Sensitive 4 SENSITIVE  Final    Method: MIC    CIPROFLOXACIN Sensitive <=0.25 SENSITIVE Final    Method: MIC    GENTAMICIN Sensitive <=1 SENSITIVE Final    Method: MIC    IMIPENEM Sensitive 1 SENSITIVE Final    Method: MIC    PIP/TAZO Sensitive 8 SENSITIVE Final    Method: MIC    TOBRAMYCIN Sensitive <=1 SENSITIVE Final    Method: MIC    Comments PSEUDOMONAS AERUGINOSA (MIC)    PSEUDOMONAS AERUGINOSA    Ceftriaxone 2/13 >> 2/14 Cefepime 2/14 >>  Goal of Therapy:  Eradicate infection.   Plan:  Cefepime 1000mg  IV every 24 hours. Follow up culture results  Hart Robinsons A 01/11/2014,10:45 AM

## 2014-01-11 NOTE — Plan of Care (Addendum)
Pt refused all oral meds this AM.  Doesn't want anymore medicine. Meds already opened - wasted Clarise Cruz, RN verified).

## 2014-01-11 NOTE — Evaluation (Signed)
Clinical/Bedside Swallow Evaluation  Patient Details  Name: Reginald Waller MRN: [id number] Date of Birth: 1919-09-05  Today's Date: 01/11/2014 Time: 1400-1430 SLP Time Calculation (min): 30 min  Past Medical History:  Past Medical History  Diagnosis Date  . Hypertension   . Arthritis   . Stroke   . Malaria   . Anemia   . Hyperlipidemia   . Urothelial cancer 12/16/2012   Past Surgical History:  Past Surgical History  Procedure Laterality Date  . Hernia repair    . Cataract extraction    . Appendectomy    . Eye surgery    . Cystoscopy  12/08/2012    Procedure: CYSTOSCOPY FLEXIBLE;  Surgeon: Marissa Nestle, MD;  Location: [facility];  Service: Urology;  Laterality: N/A;  . Transurethral resection of bladder tumor  12/09/2012    Procedure: TRANSURETHRAL RESECTION OF BLADDER TUMOR (TURBT);  Surgeon: Marissa Nestle, MD;  Location: [facility];  Service: Urology;  Laterality: N/A;   HPI:  80 year old male with a history of bladder cancer, CHF, anemia, CKD STAGE IV, hypertension who was sent from the nursing facility with chief complaint of shortness of breath. Patient is a poor historian and denies shortness of breath at this time, denies chest pain. Denies nausea vomiting or diarrhea. Denies fever Patient was recently discharged from the hospital after he was admitted with hematuria. Patient has chronic indwelling Foley catheter.     Assessment / Plan / Recommendation Clinical Impression  Reginald Waller is known to me from previous admission and MBSS last January 2014 (+aspiration of thins, rec of D3/mech soft and nectar-thick liquids). Pt initially somewhat hostile and immediately stated, "Go away!" when SLP arrived. Pt slowly became more accepting and agreeable to po intake (he reportedly refused meds and food all day). Pt exhibits mild oral weakness and suspected premature spillage/delay in swallow initiation with liquids resulting in immediate coughing after nearly every sip thin liquid. Pt  acknowledged having the MBSS a year ago, but does not recally being on thickened liquids recently. Would recommend D3/mech soft and nectar-thick liquids. If pt adamantly refuses nectar-thick liquids, consider allowing free water after good oral care. Pt also reports refusing pills because he takes "too many" and can't take all at once. Present pills po, one at a time with liquid. SLP will follow while in acute setting for diet tolerance. Above to RN and MD.    Aspiration Risk  Moderate    Diet Recommendation Dysphagia 3 (Mechanical Soft);Nectar-thick liquid   Liquid Administration via: Straw;Cup Medication Administration: Whole meds with liquid Supervision: Staff to assist with self feeding;Full supervision/cueing for compensatory strategies Compensations: Slow rate;Small sips/bites;Multiple dry swallows after each bite/sip Postural Changes and/or Swallow Maneuvers: Seated upright 90 degrees;Upright 30-60 min after meal    Other  Recommendations Oral Care Recommendations: Oral care BID;Staff/trained caregiver to provide oral care Other Recommendations: Order thickener from pharmacy;Clarify dietary restrictions   Follow Up Recommendations  24 hour supervision/assistance    Frequency and Duration min 2x/week  1 week       SLP Swallow Goals  See care plan   Swallow Study Prior Functional Status   Resident at [facility] Date of Onset: 01/08/14 HPI: 80 year old male with a history of bladder cancer, CHF, anemia, CKD STAGE IV, hypertension who was sent from the nursing facility with chief complaint of shortness of breath. Patient is a poor historian and denies shortness of breath at this time, denies chest pain. Denies nausea vomiting or  diarrhea. Denies fever Patient was recently discharged from the hospital after he was admitted with hematuria. Patient has chronic indwelling Foley catheter.   Type of Study: Bedside swallow evaluation Previous Swallow Assessment: MBSS  January 2014 with recommendation for D3/NTL, +aspiration of thin liquids Diet Prior to this Study: Regular;Thin liquids Temperature Spikes Noted: No Respiratory Status: Nasal cannula History of Recent Intubation: No Behavior/Cognition: Cooperative;Requires cueing (slow to warm up) Oral Cavity - Dentition: Dentures, top Self-Feeding Abilities: Able to feed self Patient Positioning: Upright in bed Baseline Vocal Quality: Low vocal intensity Volitional Cough: Congested;Weak Volitional Swallow: Unable to elicit    Oral/Motor/Sensory Function Overall Oral Motor/Sensory Function: Appears within functional limits for tasks assessed Labial ROM: Within Functional Limits Labial Symmetry: Within Functional Limits Labial Strength: Within Functional Limits Labial Sensation: Within Functional Limits Lingual ROM: Within Functional Limits Lingual Symmetry: Within Functional Limits Lingual Strength: Reduced Lingual Sensation: Within Functional Limits Facial ROM: Within Functional Limits Facial Symmetry: Within Functional Limits Facial Strength: Within Functional Limits Facial Sensation: Within Functional Limits Velum: Within Functional Limits Mandible: Within Functional Limits   Ice Chips Ice chips: Within functional limits Presentation: Spoon   Thin Liquid Thin Liquid: Impaired Presentation: Cup;Straw Pharyngeal  Phase Impairments: Suspected delayed Swallow;Wet Vocal Quality;Cough - Immediate    Nectar Thick Nectar Thick Liquid: Within functional limits   Honey Thick Honey Thick Liquid: Not tested   Puree Puree: Within functional limits Presentation: Spoon   Solid      Solid: Impaired Presentation: Spoon Oral Phase Impairments: Reduced lingual movement/coordination Oral Phase Functional Implications: Oral residue      Thank you,  Genene Churn, [place]  PORTER,DABNEY 01/11/2014,3:31 PM

## 2014-01-11 NOTE — Progress Notes (Signed)
PROGRESS NOTE  Reginald Waller VQQ:[id number] DOB: 02/22/1919 DOA: 01/08/2014 PCP: Leonides Grills, MD  Summary: 80 year old man presented with shortness of breath according to initial ER record, however denied shortness of breath to admitting physician.  Assessment/Plan: 1. Pseudomonas UTI in context of chronic indwelling Foley catheter. Afebrile, hemodynamic stable. Leukocytosis has resolved. Suspect etiology of leukocytosis and left shift. 2. Dyspnea. Appears resolved. Patient denies shortness of breath now. Small pleural effusions seen on chest x-ray, characterized as moderate left, small right on chest CT, question clinical significance. No evidence of pneumonia. 3. Chronic respiratory failure? On PRN oxygen at facility although patient always refuses. Former smoker, home meds include albuterol inhaler. 4. History of hematuria secondary to unresectable bladder tumor. Urine is pale yellow, no evidence of bleeding. 5. Acute renal failure superimposed on chronic kidney disease stage IV. Hold diuretics, nephrotoxins. Appears clinically dry. He has a history of grade 1 diastolic dysfunction. Gentle IV fluids. 6. History diastolic congestive heart failure.  Last echocardiogram September 2014 shows an EF of 43-32%, grade 1 diastolic dysfunction. No LE edema, no evidence of volume overload. Appears clinically dry. 7. Dysphagia. Discussed with speech therapy, plan dysphagia 3 with thickened liquids. 8. Anemia. Iron deficiency. No evidence of ongoing blood loss. Likely related to previous hematuria superimposed on anemia of chronic disease (suspected). Baseline hemoglobin appears to be 7-10. Monitor clinically.   Wean oxygen  Continue empiric antibiotics  Gentle IV fluids  Diet per speech therapy  CBC and BMP in AM. Consider transfusion for hemoglobin less than 7 or ongoing bleeding.  Code Status: DNR DVT prophylaxis: SCDs Family Communication: none present Disposition Plan: likely return  to [facility] when improved  Murray Hodgkins, MD  [facility]  Pager [phone] If 7PM-7AM, please contact night-coverage at [email or web address], password [facility] 01/11/2014, 2:03 PM  LOS: 3 days   Consultants:   Urology  Procedures:    Antibiotics:   Cefepime 2/14 >> 2/16  HPI/Subjective: He complains of some itching, he reports this is a chronic problem. Breathing okay. No other complaints. Refusing medications.  Objective: Filed Vitals:   01/10/14 1411 01/10/14 2157 01/11/14 0444 01/11/14 0921  BP: 133/57 126/53 129/48 132/54  Pulse: 68 67 66   Temp: 99.4 F (37.4 C) 98.4 F (36.9 C) 98.1 F (36.7 C)   TempSrc: Axillary Oral Axillary   Resp: 20 20 20    Height:      Weight:   71.2 kg (156 lb 15.5 oz)   SpO2: 94% 96% 100%     Intake/Output Summary (Last 24 hours) at 01/11/14 1403 Last data filed at 01/11/14 0449  Gross per 24 hour  Intake    501 ml  Output    725 ml  Net   -224 ml     Filed Weights   01/09/14 1100 01/10/14 0453 01/11/14 0444  Weight: 71.396 kg (157 lb 6.4 oz) 71.68 kg (158 lb 0.4 oz) 71.2 kg (156 lb 15.5 oz)    Exam:   Afebrile, vital signs stable.  Gen. He appears calm and comfortable. Nontoxic.  Psychiatric. Speech fluent and clear. Although some commands. Alert. Oriented to self, [facility] hospital, month; not year.  Cardiovascular regular rate and rhythm. No murmur, rub or gallop.  Respiratory clear to auscultation bilaterally. No wheezes, rales or rhonchi. Normal respiratory effort.  Abdomen soft nontender and nondistended.  Skin appears grossly unremarkable. Feet are warm and dry.  Data Reviewed:   Creatinine 4.07, BUN 92. Both trending upwards.  Hemoglobin 7.8. Iron studies suggested  iron deficiency.  Scheduled Meds: . amLODipine  10 mg Oral Daily  . antiseptic oral rinse  15 mL Mouth Rinse BID  . aspirin  81 mg Oral Daily  . carvedilol  3.125 mg Oral BID WC  . ceFEPime (MAXIPIME) IV  1 g Intravenous Q24H  .  Chlorhexidine Gluconate Cloth  6 each Topical Q0600  . darifenacin  7.5 mg Oral Daily  . dextromethorphan-guaiFENesin  1 tablet Oral Q12H  . divalproex  125 mg Oral BID  . docusate sodium  200 mg Oral BID  . feeding supplement (ENSURE COMPLETE)  237 mL Oral BID BM  . feeding supplement (PRO-STAT SUGAR FREE 64)  30 mL Oral TID WC  . furosemide  40 mg Intravenous Once  . latanoprost  1 drop Both Eyes QHS  . levothyroxine  137 mcg Oral QAC breakfast  . LORazepam  0.5 mg Oral BID  . mupirocin ointment   Nasal BID  . polyethylene glycol  17 g Oral Daily  . sodium chloride  3 mL Intravenous Q12H  . sodium chloride  3 mL Intravenous Q12H  . terazosin  1 mg Oral QHS   Continuous Infusions:   Principal Problem:   Urinary tract infection Active Problems:   UTI (lower urinary tract infection)   Bladder mass   Acute renal failure   Hypertension   Hypothyroidism   Chronic indwelling Foley catheter   Time spent 30 minutes

## 2014-01-11 NOTE — Care Management Note (Addendum)
    Page 1 of 1   01/12/2014     10:34:38 AM   CARE MANAGEMENT NOTE 01/12/2014  Patient:  Reginald Waller, Reginald Waller   Account Number:  [id number]  Date Initiated:  01/11/2014  Documentation initiated by:  Theophilus Kinds  Subjective/Objective Assessment:   Pt admitted from [facility] with UTI. Pt will return to facility when medically stable.     Action/Plan:   CSW to arrange discharge to facility when medically stable.   Anticipated DC Date:  01/15/2014   Anticipated DC Plan:  ASSISTED LIVING / REST HOME  In-house referral  Clinical Social Worker      DC Planning Services  CM consult      Choice offered to / List presented to:             Status of service:  Completed, signed off Medicare Important Message given?  YES (If response is "NO", the following Medicare IM given date fields will be blank) Date Medicare IM given:  01/12/2014 Date Additional Medicare IM given:    Discharge Disposition:  ASSISTED LIVING  Per UR Regulation:    If discussed at Long Length of Stay Meetings, dates discussed:    Comments:  01/12/14 [place], RN BSN CM Pt discharged back to [place]. CSW to arrange discharge to facility.  01/11/14 0900 Christinia Gully, RN BSN CM

## 2014-01-12 DIAGNOSIS — D62 Acute posthemorrhagic anemia: Secondary | ICD-10-CM

## 2014-01-12 LAB — BASIC METABOLIC PANEL
BUN: 95 mg/dL — ABNORMAL HIGH (ref 6–23)
CO2: 20 mEq/L (ref 19–32)
Calcium: 8.1 mg/dL — ABNORMAL LOW (ref 8.4–10.5)
Chloride: 106 mEq/L (ref 96–112)
Creatinine, Ser: 3.77 mg/dL — ABNORMAL HIGH (ref 0.50–1.35)
GFR calc Af Amer: 15 mL/min — ABNORMAL LOW (ref >90)
GFR calc non Af Amer: 13 mL/min — ABNORMAL LOW (ref >90)
Glucose, Bld: 92 mg/dL (ref 70–99)
Potassium: 4.7 mEq/L (ref 3.7–5.3)
SODIUM: 143 meq/L (ref 137–147)

## 2014-01-12 LAB — CBC
HCT: 26.6 % — ABNORMAL LOW (ref 39.0–52.0)
Hemoglobin: 8.8 g/dL — ABNORMAL LOW (ref 13.0–17.0)
MCH: 32.1 pg (ref 26.0–34.0)
MCHC: 33.1 g/dL (ref 30.0–36.0)
MCV: 97.1 fL (ref 78.0–100.0)
Platelets: 313 10*3/uL (ref 150–400)
RBC: 2.74 MIL/uL — AB (ref 4.22–5.81)
RDW: 15.4 % (ref 11.5–15.5)
WBC: 6.9 10*3/uL (ref 4.0–10.5)

## 2014-01-12 MED ORDER — CIPROFLOXACIN HCL 250 MG PO TABS: 250.0000 mg | ORAL_TABLET | Freq: Two times a day (BID) | ORAL | Status: AC

## 2014-01-12 MED ORDER — STARCH (THICKENING) PO POWD
ORAL | Status: DC | PRN
  Filled 2014-01-12: qty 227

## 2014-01-12 MED ORDER — BIOTENE DRY MOUTH MT LIQD: 15.0000 mL | Freq: Two times a day (BID) | OROMUCOSAL | Status: AC

## 2014-01-12 NOTE — Plan of Care (Signed)
Problem: Discharge Progression Outcomes Goal: Barriers To Progression Addressed/Resolved Outcome: Adequate for Discharge To [facility] house Goal: Tolerating diet Outcome: Progressing Poor appitite Goal: Activity appropriate for discharge plan Outcome: Completed/Met Date Met:  01/12/14 At baseline

## 2014-01-12 NOTE — Clinical Social Work Note (Signed)
Pt d/c today back to [facility]. Pt, niece, and facility aware and agreeable. Facility notified of continuous oxygen and dysphagia 3 diet with nectar thick liquids. Facility to provide transport this afternoon. D/C summary and FL2 faxed.  Benay Pike, [place]

## 2014-01-12 NOTE — Discharge Summary (Signed)
Physician Discharge Summary  Reginald Waller [id number] DOB: May 18, 1919 DOA: 01/08/2014  PCP: Leonides Grills, MD  Admit date: 01/08/2014 Discharge date: 01/12/2014  Time spent: 35 minutes  Recommendations for Outpatient Follow-up:  Patient will be discharged. He should continue his medications as prescribed. Patient thought his primary care physician within one week of discharge. Patient should have a BMP and CBC conducted in 1 week. He should follow up with Dr. Michela Pitcher as needed.  Discharge Diagnoses:  Dyspnea possibly secondary pleural effusions, improved UTI possibly secondary to indwelling Foley catheter, resolving Hematuria, resolved Acute on chronic kidney disease, stable History of CHF diastolic, stable Anemia normocytic, stable  Discharge Condition: Stable  Diet recommendation: Dysphagia 3  Filed Weights   01/10/14 0453 01/11/14 0444 01/12/14 0505  Weight: 71.68 kg (158 lb 0.4 oz) 71.2 kg (156 lb 15.5 oz) 71.305 kg (157 lb 3.2 oz)    History of present illness:  80 year old male with a history of bladder cancer, CHF, anemia, CKD STAGE IV, hypertension who was sent from the nursing facility with chief complaint of shortness of breath. Patient is a poor historian and denies shortness of breath at this time, denies chest pain. Denies nausea vomiting or diarrhea. Denies fever Patient was recently discharged from the hospital after he was admitted with hematuria. Patient has chronic indwelling Foley catheter.  In the ED patient was found to have leukocytosis, with abnormal U[place]. [place]NP [place] 143[address].   Hospital Course:  Dyspnea possibly secondary to pleural effusions  -Improving -Will continue his home oxygen to maintain his sats above 92%.  -Chest x-ray showed retrocardiac density compatible with effusion and atelectasis. Pneumonia is difficult to exclude but not favored.  -CT chest showed moderate left and small right pleural effusions  -One dose of lasix given in spite of his  AKI to see if his breathing improves. (2/14)  -Continue Flutter valve  UTI possibly secondary to indwelling foley catheter  -Urine culture returned >100K colonies for pseudmonas aeruginosa  -Was placed on IV cefepime  -Leukocytosis trending downward.  -Will discharge with cipro  Hematuria  -Patient does have a history of bladder cancer with indwelling foley catheter, follows up with Dr. Michela Pitcher  -Urology consulted and will follow on a PRN basis  -Foley bag- urine yellow in color, clearing   Acute on chronic kidney disease  -Creatinine currently 3.77 -Will continue to hold other nephrotoxic medications  -Will need outpatient monitoring  History of CHF, diastolic  -Currently not in exacerbation  -BNP is chronically elevated  -Last echocardiogram September 2014 shows an EF of 123456, grade 1 diastolic dysfunction  -Monitored daily weights, monitoring his input and output, fluid restriction   Anemia, nromacytic  -likely secondary to chronic renal disease  -H/H 8.8/26.6, baseline appears to be around 9-10  -Patient hemodynamically stable -Iron 14 Ferritin 75 -Resume iron polysaccharide  Procedures: None  Consultations: Urology  Discharge Exam: Filed Vitals:   01/12/14 0505  BP: 142/54  Pulse: 77  Temp: 98.1 F (36.7 C)  Resp: 20    Exam  General: Well developed, well nourished, NAD, appears stated age  HEENT: NCAT, mucous membranes moist.  Neck: Supple, no JVD, no masses  Cardiovascular: S1 S2 auscultated, Regular rate and rhythm.  Respiratory: Clear to auscultation.  Abdomen: Soft, nontender, nondistended, + bowel sounds  Extremities: warm dry without cyanosis clubbing or edema  Neuro: AAOx3, no focal deficits  Skin: Without rashes exudates or nodules  Psych: Normal affect and demeanor with intact judgement and insight  Discharge  Instructions      Discharge Orders   Future Orders Complete By Expires   Discharge instructions  As directed    Comments:      Patient will be discharged. He should continue his medications as prescribed. Patient thought his primary care physician within one week of discharge. Patient should have a BMP and CBC conducted in 1 week. He should follow up with Dr. Michela Pitcher as needed.   Increase activity slowly  As directed        Medication List         acetaminophen 500 MG tablet  Commonly known as:  TYLENOL  Take 500 mg by mouth every 6 (six) hours as needed. pain     amLODipine 10 MG tablet  Commonly known as:  NORVASC  Take 1 tablet (10 mg total) by mouth daily.     antiseptic oral rinse Liqd  15 mLs by Mouth Rinse route 2 (two) times daily.     aspirin 81 MG chewable tablet  Chew 81 mg by mouth daily.     carvedilol 3.125 MG tablet  Commonly known as:  COREG  Take 1 tablet (3.125 mg total) by mouth 2 (two) times daily with a meal.     ciprofloxacin 250 MG tablet  Commonly known as:  CIPRO  Take 1 tablet (250 mg total) by mouth 2 (two) times daily.     divalproex 125 MG capsule  Commonly known as:  DEPAKOTE SPRINKLE  Take 125 mg by mouth 2 (two) times daily.     docusate sodium 100 MG capsule  Commonly known as:  COLACE  Take 200 mg by mouth 2 (two) times daily.     latanoprost 0.005 % ophthalmic solution  Commonly known as:  XALATAN  Place 1 drop into both eyes at bedtime.     levothyroxine 137 MCG tablet  Commonly known as:  SYNTHROID, LEVOTHROID  Take 137 mcg by mouth daily.     LORazepam 0.5 MG tablet  Commonly known as:  ATIVAN  Take 0.5 mg by mouth 2 (two) times daily.     mupirocin ointment 2 %  Commonly known as:  BACTROBAN  Place 1 application into the nose 2 (two) times daily.     oxyCODONE 5 MG immediate release tablet  Commonly known as:  Oxy IR/ROXICODONE  Take 1 tablet (5 mg total) by mouth every 6 (six) hours as needed for moderate pain.     POLY-IRON 150 150 MG capsule  Generic drug:  iron polysaccharides  Take 150 mg by mouth daily.     polyethylene glycol packet    Commonly known as:  MIRALAX / GLYCOLAX  Take 17 g by mouth daily.     PROAIR HFA 108 (90 BASE) MCG/ACT inhaler  Generic drug:  albuterol  Inhale 2 puffs into the lungs 4 (four) times daily as needed for wheezing or shortness of breath. dyspnea     senna 8.6 MG Tabs tablet  Commonly known as:  SENOKOT  Take 1 tablet by mouth 2 (two) times daily.     solifenacin 5 MG tablet  Commonly known as:  VESICARE  Take 5 mg by mouth daily.     terazosin 1 MG capsule  Commonly known as:  HYTRIN  Take 1 mg by mouth at bedtime.     torsemide 20 MG tablet  Commonly known as:  DEMADEX  Take 2.5 tablets (50 mg total) by mouth daily.     zinc oxide 20 % ointment  Apply 1 application topically 2 (two) times daily.       Allergies  Allergen Reactions  . Shellfish Allergy Other (See Comments)    Swelling of throat and tongue.   Follow-up Information   Follow up with Leonides Grills, MD. Schedule an appointment as soon as possible for a visit in 1 week.   Specialty:  Family Medicine   Contact information:   [address] [place] [address] [phone]       Follow up with Marissa Nestle, MD. (As needed)    Specialty:  Urology   Contact information:   [id number] [place] [address] [phone]        The results of significant diagnostics from this hospitalization (including imaging, microbiology, ancillary and laboratory) are listed below for reference.    Significant Diagnostic Studies: Dg Chest 2 View  01/08/2014   CLINICAL DATA:  Congestion.  Short of breath.  EXAM: CHEST  2 VIEW  COMPARISON:  DG CHEST 1 VIEW dated 08/04/2013; DG CHEST 1V PORT dated 08/03/2013; DG CHEST 1 VIEW dated 07/31/2013; DG CHEST 1V PORT dated 12/15/2012; DG CHEST 2 VIEW dated 12/04/2012  FINDINGS: Cardiomegaly. Basilar atelectasis. Retrocardiac density is present likely representing atelectasis and effusion and combination. The effusion is best visualized on the  lateral view. Aortic arch atherosclerosis.  When compared to the most recent prior examinations, pulmonary aeration has improved although the retrocardiac density has persisted. It is difficult to completely exclude infection in the retrocardiac region however not favored.  IMPRESSION: 1. Cardiomegaly. 2. Retrocardiac density compatible with effusion and atelectasis. Pneumonia is difficult to exclude but not favored. No pulmonary edema.   Electronically Signed   By: Dereck Ligas M.D.   On: 01/08/2014 01:34   Ct Chest Wo Contrast  01/08/2014   CLINICAL DATA:  Shortness of breath.  Elevated BNP.  EXAM: CT CHEST WITHOUT CONTRAST  TECHNIQUE: Multidetector CT imaging of the chest was performed following the standard protocol without IV contrast.  COMPARISON:  Chest x-ray dated 01/08/2014  FINDINGS: There is a moderate left pleural effusion with compressive atelectasis in the left lower lobe. There is a small right effusion with compressive atelectasis in the right lower lobe.  Heart size is within normal limits. Extensive coronary artery calcification. No appreciable hilar or mediastinal adenopathy.  Note is made of large bilateral glenohumeral joint effusions with marked erosions of the glenoids bilaterally.  Also noted is chronic hydronephrosis of the left kidney. No acute osseous abnormality.  IMPRESSION: 1. Moderate left and small right pleural effusions with secondary bibasilar atelectasis as described. 2. Heart bilateral glenohumeral joint effusions. Does the patient have a known inflammatory arthropathy?   Electronically Signed   By: Rozetta Nunnery M.D.   On: 01/08/2014 12:35    Microbiology: Recent Results (from the past 240 hour(s))  URINE CULTURE     Status: None   Collection Time    01/08/14 12:45 AM      Result Value Ref Range Status   Specimen Description URINE, CATHETERIZED   Final   Special Requests NONE   Final   Culture  Setup Time     Final   Value: 01/08/2014 03:00     Performed at  [facility]     Final   Value: >=100,000 COLONIES/ML     Performed at [facility]   Culture     Final   Value: PSEUDOMONAS AERUGINOSA  Performed at [facility]   Report Status 01/10/2014 FINAL   Final   Organism ID, Bacteria PSEUDOMONAS AERUGINOSA   Final  MRSA PCR SCREENING     Status: Abnormal   Collection Time    01/08/14 10:30 AM      Result Value Ref Range Status   MRSA by PCR POSITIVE (*) NEGATIVE Final   Comment:            The GeneXpert MRSA Assay (FDA     approved for NASAL specimens     only), is one component of a     comprehensive MRSA colonization     surveillance program. It is not     intended to diagnose MRSA     infection nor to guide or     monitor treatment for     MRSA infections.     RESULT CALLED TO, READ BACK BY AND VERIFIED WITH:     WRIGHT,M AT 1220 BY GODFREY,O ON 01/08/14     Labs: Basic Metabolic Panel:  Recent Labs Lab 01/08/14 0536 01/09/14 0803 01/10/14 0546 01/11/14 0452 01/12/14 0507  NA 141 140 140 141 143  K 5.3 5.1 4.7 4.7 4.7  CL 105 101 101 104 106  CO2 22 23 23 21 20   GLUCOSE 121* 90 91 91 92  BUN 62* 66* 79* 92* 95*  CREATININE 3.19* 3.54* 3.95* 4.07* 3.77*  CALCIUM 8.4 8.4 8.4 8.1* 8.1*   Liver Function Tests:  Recent Labs Lab 01/08/14 0536  AST 13  ALT 6  ALKPHOS 74  BILITOT 0.3  PROT 6.8  ALBUMIN 2.9*   No results found for this basename: LIPASE, AMYLASE,  in the last 168 hours No results found for this basename: AMMONIA,  in the last 168 hours CBC:  Recent Labs Lab 01/08/14 0013 01/08/14 0536 01/09/14 0803 01/10/14 0546 01/11/14 0452 01/12/14 0507  WBC 19.2* 25.9* 19.2* 14.6* 10.1 6.9  NEUTROABS 16.6*  --   --   --   --   --   HGB 9.3* 9.0* 9.0* 7.7* 7.8* 8.8*  HCT 27.5* 27.1* 27.2* 24.1* 24.0* 26.6*  MCV 96.2 96.8 98.6 98.0 98.4 97.1  PLT 319 295 280 276 276 313   Cardiac Enzymes:  Recent Labs Lab 01/08/14 0536 01/08/14 1124 01/08/14 1724    TROPONINI <0.30 <0.30 <0.30   BNP: BNP (last 3 results)  Recent Labs  07/31/13 2103 08/05/13 1030 01/08/14 0013  PROBNP 13832.0* 24573.0* 14330.0*   CBG: No results found for this basename: GLUCAP,  in the last 168 hours     Signed:  Cristal Ford  [facility] 01/12/2014, 10:20 AM

## 2014-01-13 NOTE — Progress Notes (Signed)
UR chart review completed.  

## 2014-02-24 DEATH — deceased

## 2014-12-27 ENCOUNTER — Telehealth: Payer: Self-pay

## 2014-12-27 NOTE — Telephone Encounter (Signed)
Patient died @ [facility] per Iver Nestle

## 2016-01-14 IMAGING — CT CT CHEST W/O CM
2 of 3 series · 15 of 36 positions shown, 18 images · non-contrast
Comparison: Chest x-ray dated 01/08/2014

CLINICAL DATA: Shortness of breath.  Elevated BNP.

EXAM:
CT CHEST WITHOUT CONTRAST
TECHNIQUE: Multidetector CT imaging of the chest was performed following the
standard protocol without IV contrast.

[Series 2: chestroutine 5.0 b40f · axial · 0.70mm/px · z∈[-307,-42]mm · 12 of 63 slices shown, 15 images]
[im 5/63  mediastinal]
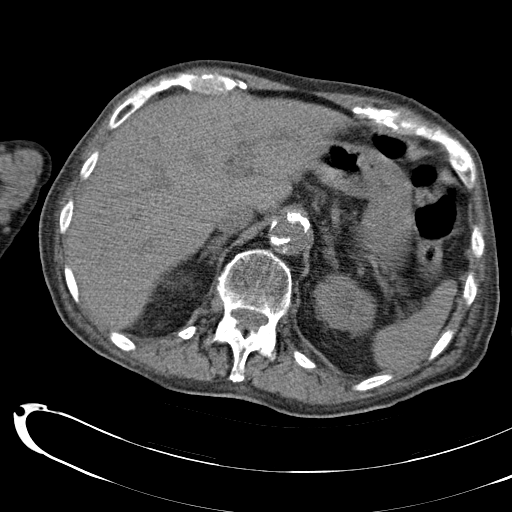
[im 5/63  lung]
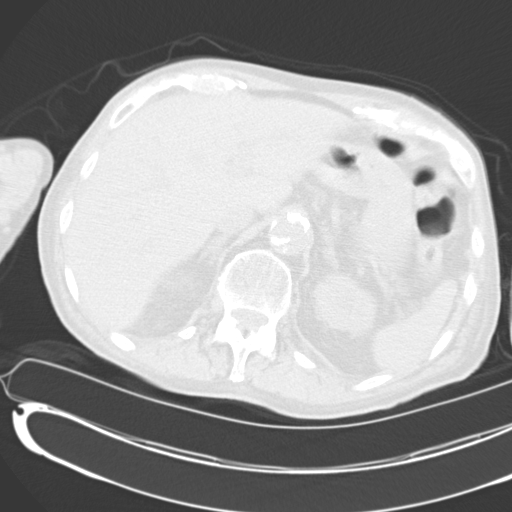
[im 10/63  lung]
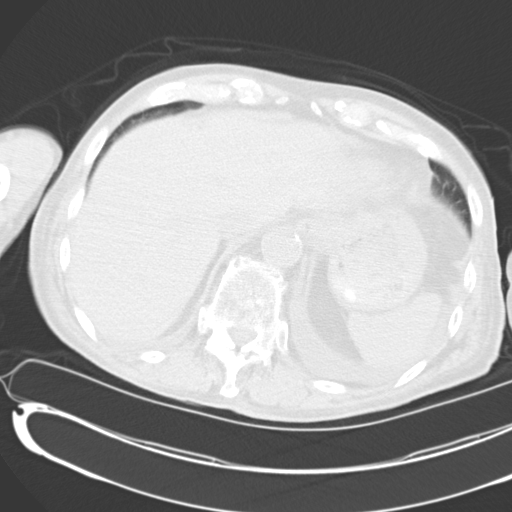
[im 14/63  lung]
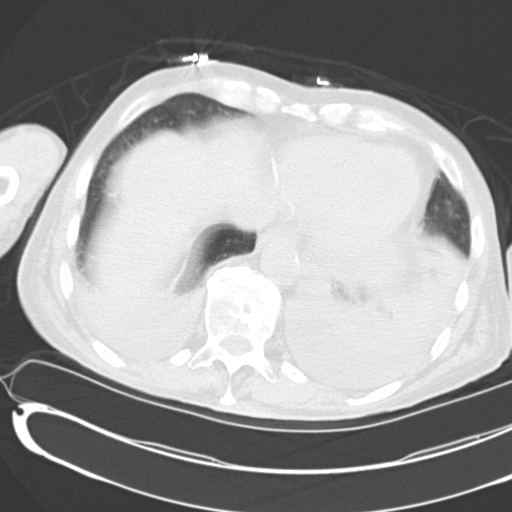
[im 19/63  lung]
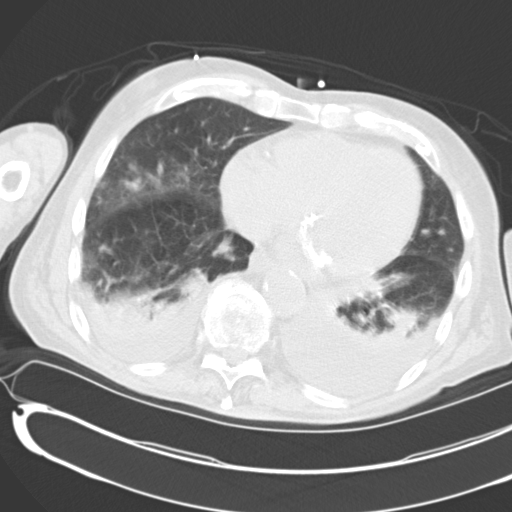
[im 23/63  mediastinal]
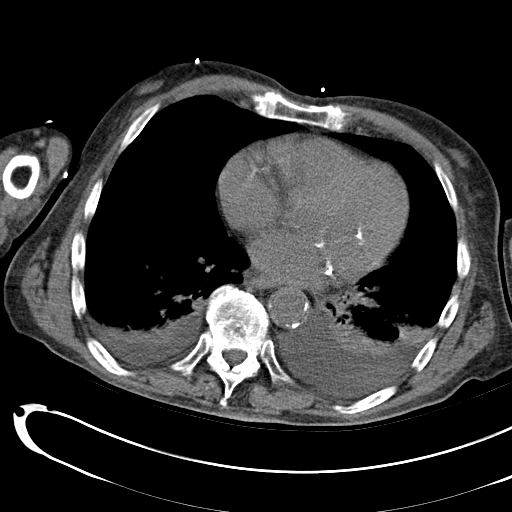
[im 23/63  lung]
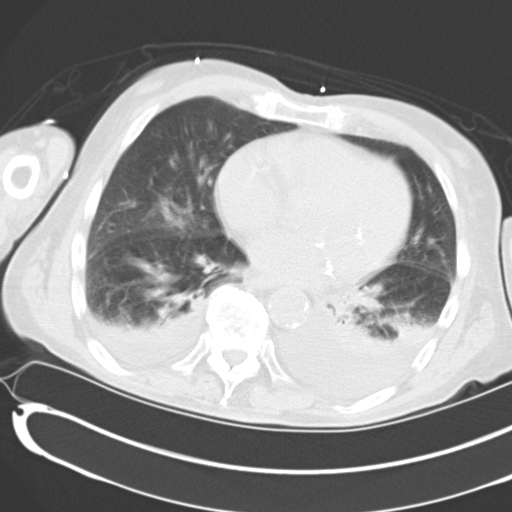
[im 28/63  lung]
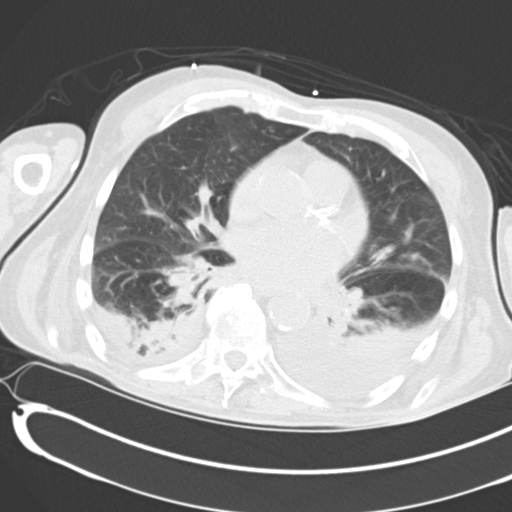
[im 35/63  lung]
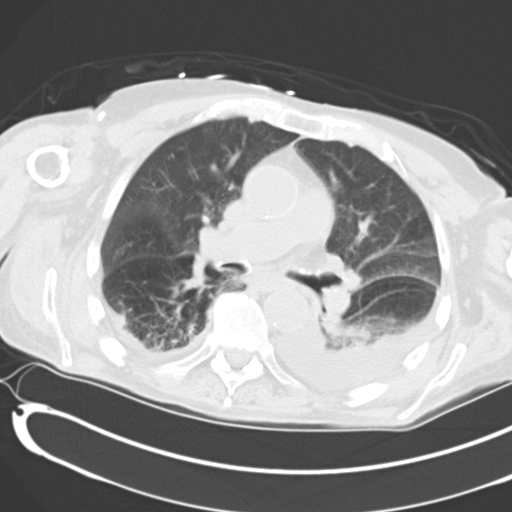
[im 40/63  lung]
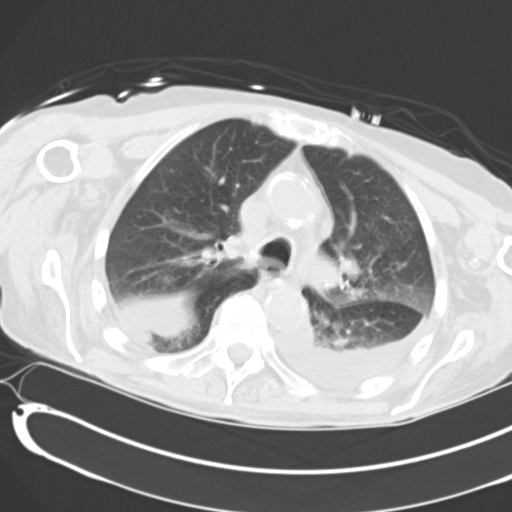
[im 44/63  mediastinal]
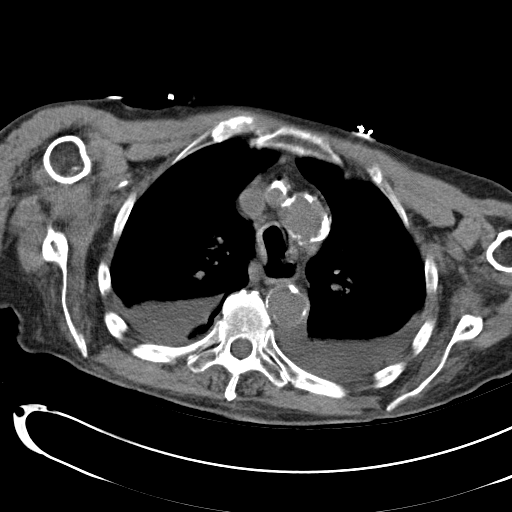
[im 44/63  lung]
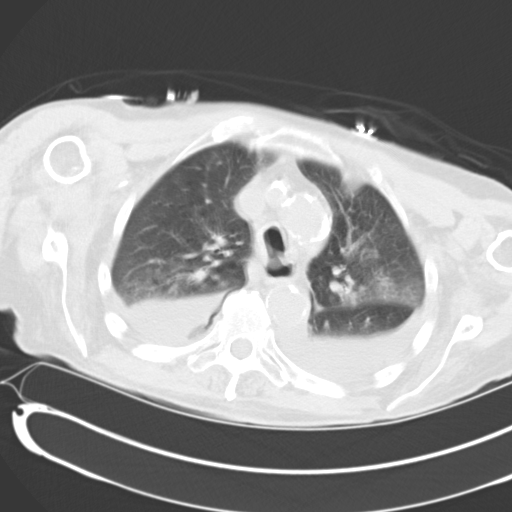
[im 49/63  lung]
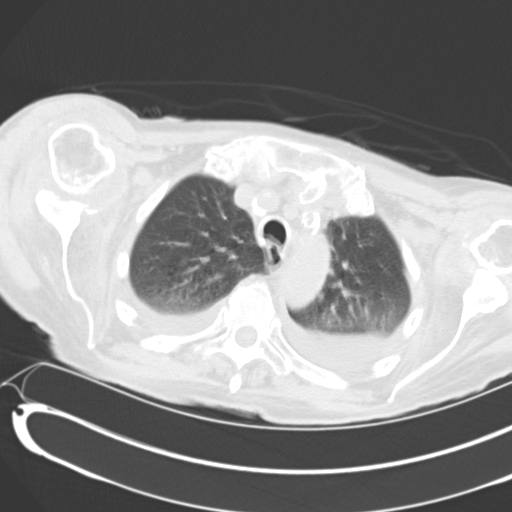
[im 53/63  lung]
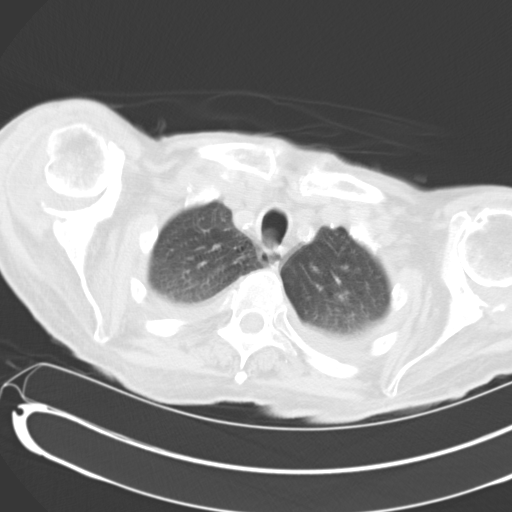
[im 58/63  lung]
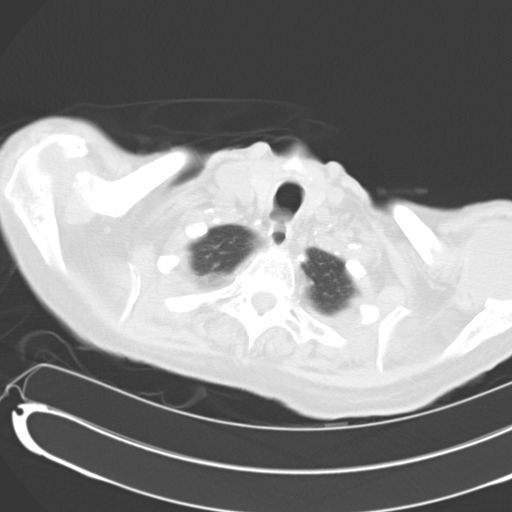

[Series 4: mpr coro 3mm · coronal · 0.63mm/px · 3 of 88 slices shown]
[im 18/88  lung]
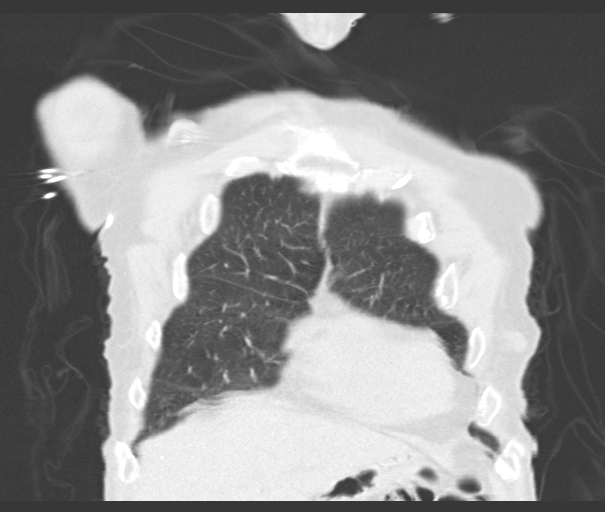
[im 35/88  lung]
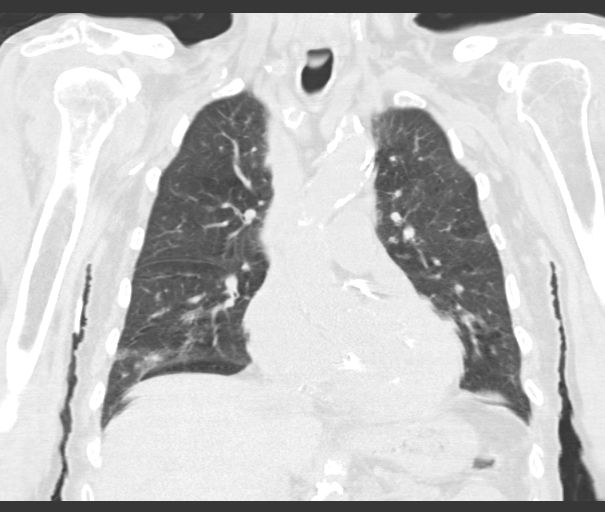
[im 53/88  lung]
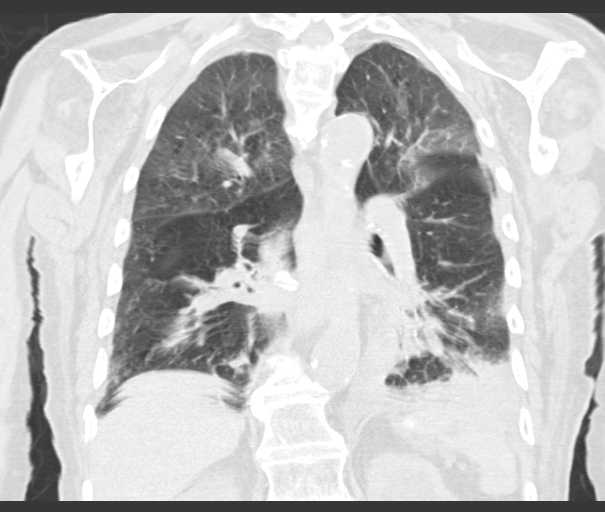

[15 of 36 positions shown; findings below may reference images not displayed]

FINDINGS: There is a moderate left pleural effusion with compressive
atelectasis in the left lower lobe. There is a small right effusion
with compressive atelectasis in the right lower lobe.

Heart size is within normal limits. Extensive coronary artery
calcification. No appreciable hilar or mediastinal adenopathy.

Note is made of large bilateral glenohumeral joint effusions with
marked erosions of the glenoids bilaterally.

Also noted is chronic hydronephrosis of the left kidney. No acute
osseous abnormality.
IMPRESSION: 1. Moderate left and small right pleural effusions with secondary
bibasilar atelectasis as described.
2. Heart bilateral glenohumeral joint effusions. Does the patient
have a known inflammatory arthropathy?
